# Patient Record
Sex: Male | Born: 1945 | Race: Black or African American | Hispanic: No | Marital: Married | Smoking: Current every day smoker
Health system: Southern US, Community
[De-identification: ages and names within clinical notes are randomized; demographics above are authoritative.]

## PROBLEM LIST (undated history)

## (undated) DIAGNOSIS — C801 Malignant (primary) neoplasm, unspecified: Secondary | ICD-10-CM

## (undated) DIAGNOSIS — I10 Essential (primary) hypertension: Secondary | ICD-10-CM

## (undated) DIAGNOSIS — E119 Type 2 diabetes mellitus without complications: Secondary | ICD-10-CM

## (undated) HISTORY — PX: CHOLECYSTECTOMY: SHX55

## (undated) HISTORY — PX: COLON SURGERY: SHX602

## (undated) HISTORY — PX: TONSILLECTOMY: SUR1361

## (undated) HISTORY — PX: APPENDECTOMY: SHX54

---

## 1998-02-07 ENCOUNTER — Emergency Department (HOSPITAL_COMMUNITY): Admission: EM | Admit: 1998-02-07 | Discharge: 1998-02-07 | Payer: Self-pay | Admitting: Emergency Medicine

## 2002-06-05 ENCOUNTER — Encounter: Admission: RE | Admit: 2002-06-05 | Discharge: 2002-09-03 | Payer: Self-pay | Admitting: Internal Medicine

## 2002-09-19 ENCOUNTER — Encounter: Admission: RE | Admit: 2002-09-19 | Discharge: 2002-12-18 | Payer: Self-pay | Admitting: Internal Medicine

## 2005-06-12 ENCOUNTER — Encounter: Admission: RE | Admit: 2005-06-12 | Discharge: 2005-06-12 | Payer: Self-pay | Admitting: Gastroenterology

## 2005-06-16 ENCOUNTER — Encounter: Admission: RE | Admit: 2005-06-16 | Discharge: 2005-06-16 | Payer: Self-pay | Admitting: Surgery

## 2005-08-05 ENCOUNTER — Inpatient Hospital Stay (HOSPITAL_COMMUNITY): Admission: RE | Admit: 2005-08-05 | Discharge: 2005-08-09 | Payer: Self-pay | Admitting: Surgery

## 2010-09-22 ENCOUNTER — Emergency Department (HOSPITAL_COMMUNITY)
Admission: EM | Admit: 2010-09-22 | Discharge: 2010-09-22 | Disposition: A | Discharge status: Home / Self Care | Payer: Medicare Other | Attending: Emergency Medicine | Admitting: Emergency Medicine

## 2010-09-22 DIAGNOSIS — E119 Type 2 diabetes mellitus without complications: Secondary | ICD-10-CM | POA: Insufficient documentation

## 2010-09-22 DIAGNOSIS — L02818 Cutaneous abscess of other sites: Secondary | ICD-10-CM | POA: Insufficient documentation

## 2010-09-22 DIAGNOSIS — I10 Essential (primary) hypertension: Secondary | ICD-10-CM | POA: Insufficient documentation

## 2010-09-24 ENCOUNTER — Inpatient Hospital Stay (INDEPENDENT_AMBULATORY_CARE_PROVIDER_SITE_OTHER)
Admission: RE | Admit: 2010-09-24 | Discharge: 2010-09-24 | Disposition: A | Discharge status: Home / Self Care | Payer: Medicare Other | Source: Ambulatory Visit | Attending: Family Medicine | Admitting: Family Medicine

## 2010-09-24 DIAGNOSIS — L02818 Cutaneous abscess of other sites: Secondary | ICD-10-CM

## 2010-10-27 ENCOUNTER — Inpatient Hospital Stay (INDEPENDENT_AMBULATORY_CARE_PROVIDER_SITE_OTHER)
Admission: RE | Admit: 2010-10-27 | Discharge: 2010-10-27 | Disposition: A | Discharge status: Home / Self Care | Payer: Medicare Other | Source: Ambulatory Visit | Attending: Family Medicine | Admitting: Family Medicine

## 2010-10-27 DIAGNOSIS — L0201 Cutaneous abscess of face: Secondary | ICD-10-CM

## 2010-10-29 ENCOUNTER — Inpatient Hospital Stay (HOSPITAL_COMMUNITY)
Admission: RE | Admit: 2010-10-29 | Discharge: 2010-10-29 | Disposition: A | Discharge status: Home / Self Care | Payer: Medicare Other | Source: Ambulatory Visit | Attending: Family Medicine | Admitting: Family Medicine

## 2013-08-06 ENCOUNTER — Encounter (HOSPITAL_COMMUNITY): Payer: Self-pay | Admitting: Emergency Medicine

## 2013-08-06 ENCOUNTER — Ambulatory Visit (HOSPITAL_COMMUNITY)
Admission: RE | Admit: 2013-08-06 | Discharge: 2013-08-06 | Disposition: A | Discharge status: Home / Self Care | Payer: Medicare Other | Source: Ambulatory Visit | Attending: Gynecologic Oncology | Admitting: Gynecologic Oncology

## 2013-08-06 ENCOUNTER — Emergency Department (INDEPENDENT_AMBULATORY_CARE_PROVIDER_SITE_OTHER)
Admission: EM | Admit: 2013-08-06 | Discharge: 2013-08-06 | Disposition: A | Discharge status: Home / Self Care | Payer: Medicare Other | Source: Home / Self Care

## 2013-08-06 DIAGNOSIS — M7989 Other specified soft tissue disorders: Secondary | ICD-10-CM

## 2013-08-06 DIAGNOSIS — R609 Edema, unspecified: Secondary | ICD-10-CM | POA: Diagnosis present

## 2013-08-06 DIAGNOSIS — L03119 Cellulitis of unspecified part of limb: Secondary | ICD-10-CM

## 2013-08-06 DIAGNOSIS — L03115 Cellulitis of right lower limb: Secondary | ICD-10-CM

## 2013-08-06 DIAGNOSIS — L02419 Cutaneous abscess of limb, unspecified: Secondary | ICD-10-CM

## 2013-08-06 HISTORY — DX: Essential (primary) hypertension: I10

## 2013-08-06 HISTORY — DX: Type 2 diabetes mellitus without complications: E11.9

## 2013-08-06 HISTORY — DX: Malignant (primary) neoplasm, unspecified: C80.1

## 2013-08-06 LAB — POCT I-STAT, CHEM 8
BUN: 22 mg/dL (ref 6–23)
CHLORIDE: 106 meq/L (ref 96–112)
Calcium, Ion: 1.2 mmol/L (ref 1.13–1.30)
Creatinine, Ser: 1.4 mg/dL — ABNORMAL HIGH (ref 0.50–1.35)
GLUCOSE: 108 mg/dL — AB (ref 70–99)
HEMATOCRIT: 52 % (ref 39.0–52.0)
HEMOGLOBIN: 17.7 g/dL — AB (ref 13.0–17.0)
POTASSIUM: 4.4 meq/L (ref 3.7–5.3)
Sodium: 141 mEq/L (ref 137–147)
TCO2: 24 mmol/L (ref 0–100)

## 2013-08-06 LAB — CBC WITH DIFFERENTIAL/PLATELET
BASOS PCT: 0 % (ref 0–1)
Basophils Absolute: 0 10*3/uL (ref 0.0–0.1)
EOS PCT: 1 % (ref 0–5)
Eosinophils Absolute: 0.1 10*3/uL (ref 0.0–0.7)
HEMATOCRIT: 45.7 % (ref 39.0–52.0)
Hemoglobin: 15.4 g/dL (ref 13.0–17.0)
LYMPHS ABS: 1.9 10*3/uL (ref 0.7–4.0)
LYMPHS PCT: 18 % (ref 12–46)
MCH: 31.3 pg (ref 26.0–34.0)
MCHC: 33.7 g/dL (ref 30.0–36.0)
MCV: 92.9 fL (ref 78.0–100.0)
Monocytes Absolute: 0.6 10*3/uL (ref 0.1–1.0)
Monocytes Relative: 6 % (ref 3–12)
Neutro Abs: 8.2 10*3/uL — ABNORMAL HIGH (ref 1.7–7.7)
Neutrophils Relative %: 75 % (ref 43–77)
Platelets: 309 10*3/uL (ref 150–400)
RBC: 4.92 MIL/uL (ref 4.22–5.81)
RDW: 14 % (ref 11.5–15.5)
WBC: 10.9 10*3/uL — ABNORMAL HIGH (ref 4.0–10.5)

## 2013-08-06 LAB — D-DIMER, QUANTITATIVE (NOT AT ARMC): D DIMER QUANT: 0.44 ug{FEU}/mL (ref 0.00–0.48)

## 2013-08-06 MED ORDER — CEFTRIAXONE SODIUM 1 G IJ SOLR
1.0000 g | Freq: Once | INTRAMUSCULAR | Status: AC
  Administered 2013-08-06: 1 g via INTRAMUSCULAR

## 2013-08-06 MED ORDER — CEPHALEXIN 500 MG PO CAPS: 500.0000 mg | ORAL_CAPSULE | Freq: Four times a day (QID) | ORAL | Status: DC

## 2013-08-06 MED ORDER — ALIGN 4 MG PO CAPS: 1.0000 | ORAL_CAPSULE | Freq: Every day | ORAL | Status: DC

## 2013-08-06 MED ORDER — CEFTRIAXONE SODIUM 1 G IJ SOLR
INTRAMUSCULAR | Status: AC
  Filled 2013-08-06: qty 10

## 2013-08-06 MED ORDER — SULFAMETHOXAZOLE-TMP DS 800-160 MG PO TABS
1.0000 | ORAL_TABLET | Freq: Two times a day (BID) | ORAL | Status: DC
Start: 2013-08-06 — End: 2013-09-15

## 2013-08-06 MED ORDER — LIDOCAINE HCL (PF) 1 % IJ SOLN
INTRAMUSCULAR | Status: AC
  Filled 2013-08-06: qty 5

## 2013-08-06 NOTE — ED Notes (Signed)
Sent to ED to meet doppler tech . Will call us when completed to return to [facility]; labs drawn prior to transport

## 2013-08-06 NOTE — Progress Notes (Signed)
VASCULAR LAB PRELIMINARY  PRELIMINARY  PRELIMINARY  PRELIMINARY  Right lower extremity venous Doppler completed.    Preliminary report:  There is no DVT or SVT noted in the right lower extremity.  There is an enlarged lymph node noted in the right groin.   Kymari Nuon, RVT 08/06/2013, 10:13 AM

## 2013-08-06 NOTE — Discharge Instructions (Signed)
Follow up here in 2 days for recheck.  Return to Emergency Department sooner if any worsening of symptoms such as pain, fever, increased swelling, discharge or numbness and tingling.   Cellulitis Cellulitis is an infection of the skin and the tissue beneath it. The infected area is usually red and tender. Cellulitis occurs most often in the arms and lower legs.  CAUSES  Cellulitis is caused by bacteria that enter the skin through cracks or cuts in the skin. The most common types of bacteria that cause cellulitis are staphylococci and streptococci. SIGNS AND SYMPTOMS   Redness and warmth.  Swelling.  Tenderness or pain.  Fever. DIAGNOSIS  Your health care provider can usually determine what is wrong based on a physical exam. Blood tests may also be done. TREATMENT  Treatment usually involves taking an antibiotic medicine. HOME CARE INSTRUCTIONS   Take your antibiotic medicine as directed by your health care provider. Finish the antibiotic even if you start to feel better.  Keep the infected arm or leg elevated to reduce swelling.  Apply a warm cloth to the affected area up to 4 times per day to relieve pain.  Take medicines only as directed by your health care provider.  Keep all follow-up visits as directed by your health care provider. SEEK MEDICAL CARE IF:   You notice red streaks coming from the infected area.  Your red area gets larger or turns dark in color.  Your bone or joint underneath the infected area becomes painful after the skin has healed.  Your infection returns in the same area or another area.  You notice a swollen bump in the infected area.  You develop new symptoms.  You have a fever. SEEK IMMEDIATE MEDICAL CARE IF:   You feel very sleepy.  You develop vomiting or diarrhea.  You have a general ill feeling (malaise) with muscle aches and pains. MAKE SURE YOU:   Understand these instructions.  Will watch your condition.  Will get help right  away if you are not doing well or get worse. Document Released: 10/08/2004 Document Revised: 05/15/2013 Document Reviewed: 03/16/2011 [facility] Patient Information 2015 [place], [place]. This information is not intended to replace advice given to you by your health care provider. Make sure you discuss any questions you have with your health care provider.

## 2013-08-06 NOTE — ED Notes (Addendum)
C/o right leg swollen from knee down for ~10 days. Started on HCTZ a few days ago from Dr Amadeo Garnet

## 2013-08-06 NOTE — ED Provider Notes (Signed)
CSN: [id number]     Arrival date & time 08/06/13  0901 History   None    Chief Complaint  Patient presents with  . Leg Swelling   (Consider location/radiation/quality/duration/timing/severity/associated sxs/prior Treatment)  HPI  She is a 68 year old male presenting today with complaints of swelling of his right lower extremity which started approximately one week ago. Patient was seen by his primary care provider in the office this past Wednesday and started on hydrochlorothiazide for edema. Patient is type 2 diabetic, controlled with oral medications. Patient denies any pain or discomfort. Denies any numbness or tingling. Patient states that it is "itchy" and states that redness started yesterday after he "went after it" with a rag.  Past Medical History  Diagnosis Date  . Hypertension   . Diabetes mellitus without complication   . Cancer    History reviewed. No pertinent past surgical history. History reviewed. No pertinent family history. History  Substance Use Topics  . Smoking status: Current Every Day Smoker  . Smokeless tobacco: Not on file  . Alcohol Use: No    Review of Systems  Constitutional: Negative.  Negative for fever, chills and fatigue.  HENT: Negative.   Eyes: Negative.   Respiratory: Negative.  Negative for cough, chest tightness, shortness of breath and wheezing.   Cardiovascular: Positive for leg swelling. Negative for chest pain and palpitations.       Patient denies chest pain or pressure. Patient denies shortness of breath, nausea or vomiting.  Patient denies any history of DVT or MI.  Gastrointestinal: Negative.        History of colon cancer and colon resection.  Endocrine: Negative for polydipsia, polyphagia and polyuria.       Patient is a type II diabetic controlled with oral medications.  Genitourinary: Negative.   Musculoskeletal: Negative.   Skin: Positive for color change and wound.       Patient has multiple areas of excoriation and right  lower extremity where he states the leg was "itchy" and he has removed the skin with a rag.  Allergic/Immunologic: Positive for environmental allergies.  Neurological: Negative.  Negative for weakness and headaches.  Hematological: Negative.   Psychiatric/Behavioral: Negative.     Allergies  Review of patient's allergies indicates no known allergies.  Home Medications   Prior to Admission medications   Medication Sig Start Date End Date Taking? Authorizing Provider  glipiZIDE (GLUCOTROL) 10 MG tablet Take 10 mg by mouth daily before breakfast.   Yes Historical Provider, MD  hydrochlorothiazide (HYDRODIURIL) 50 MG tablet Take 50 mg by mouth daily.   Yes Historical Provider, MD  metFORMIN (GLUCOPHAGE) 1000 MG tablet Take 1,000 mg by mouth 2 (two) times daily with a meal.   Yes Historical Provider, MD  cephALEXin (KEFLEX) 500 MG capsule Take 1 capsule (500 mg total) by mouth 4 (four) times daily. 08/07/13   Jacqualyn Posey, NP  Probiotic Product (ALIGN) 4 MG CAPS Take 1 capsule (4 mg total) by mouth daily. 08/06/13   Jacqualyn Posey, NP  sulfamethoxazole-trimethoprim (BACTRIM DS) 800-160 MG per tablet Take 1 tablet by mouth 2 (two) times daily. 08/06/13   Jacqualyn Posey, NP   BP 142/90  Pulse 115  Temp(Src) 98.6 F (37 C) (Oral)  Resp 20  SpO2 94%  Physical Exam  Nursing note and vitals reviewed. Constitutional: He is oriented to person, place, and time. He appears well-developed and well-nourished. No distress.  Cardiovascular: Regular rhythm, normal heart sounds and intact distal pulses.  Exam reveals no  gallop and no friction rub.   No murmur heard. Patient is tachycardic.    Pulmonary/Chest: Effort normal and breath sounds normal. No respiratory distress. He has no wheezes. He has no rales. He exhibits no tenderness.  No adventitious breath sounds noted.  Musculoskeletal:       Legs: Neurological: He is alert and oriented to person, place, and time. He displays normal reflexes.  He exhibits normal muscle tone. Coordination normal.  Cranial nerve II through XII grossly intact.  Skin: Skin is warm and dry. He is not diaphoretic. There is erythema.  See diagram.  Numerous breaks in skin noted to calf and shin of right lower extremity. No evidence of drainage or odor. Warmth and erythema present consistent with a stocking pattern below right knee.  Cap refill to bilateral lower extremities less than 3 seconds. CMS intact to bilateral lower extremities. 2+ pitting edema present on right lower extremity.  Strength 5 over 5 bilateral lower extremities.   ED Course  Procedures (including critical care time) Labs Review Labs Reviewed  CBC WITH DIFFERENTIAL - Abnormal; Notable for the following:    WBC 10.9 (*)    Neutro Abs 8.2 (*)    All other components within normal limits  POCT I-STAT, CHEM 8 - Abnormal; Notable for the following:    Creatinine, Ser 1.40 (*)    Glucose, Bld 108 (*)    Hemoglobin 17.7 (*)    All other components within normal limits  D-DIMER, QUANTITATIVE  I-STAT CHEM 8, ED   Results for orders placed during the hospital encounter of 08/06/13  D-DIMER, QUANTITATIVE      Result Value Ref Range   D-Dimer, Quant 0.44  0.00 - 0.48 ug/mL-FEU  CBC WITH DIFFERENTIAL      Result Value Ref Range   WBC 10.9 (*) 4.0 - 10.5 K/uL   RBC 4.92  4.22 - 5.81 MIL/uL   Hemoglobin 15.4  13.0 - 17.0 g/dL   HCT 45.7  39.0 - 52.0 %   MCV 92.9  78.0 - 100.0 fL   MCH 31.3  26.0 - 34.0 pg   MCHC 33.7  30.0 - 36.0 g/dL   RDW 14.0  11.5 - 15.5 %   Platelets 309  150 - 400 K/uL   Neutrophils Relative % 75  43 - 77 %   Neutro Abs 8.2 (*) 1.7 - 7.7 K/uL   Lymphocytes Relative 18  12 - 46 %   Lymphs Abs 1.9  0.7 - 4.0 K/uL   Monocytes Relative 6  3 - 12 %   Monocytes Absolute 0.6  0.1 - 1.0 K/uL   Eosinophils Relative 1  0 - 5 %   Eosinophils Absolute 0.1  0.0 - 0.7 K/uL   Basophils Relative 0  0 - 1 %   Basophils Absolute 0.0  0.0 - 0.1 K/uL  POCT I-STAT, CHEM 8        Result Value Ref Range   Sodium 141  137 - 147 mEq/L   Potassium 4.4  3.7 - 5.3 mEq/L   Chloride 106  96 - 112 mEq/L   BUN 22  6 - 23 mg/dL   Creatinine, Ser 1.40 (*) 0.50 - 1.35 mg/dL   Glucose, Bld 108 (*) 70 - 99 mg/dL   Calcium, Ion 1.20  1.13 - 1.30 mmol/L   TCO2 24  0 - 100 mmol/L   Hemoglobin 17.7 (*) 13.0 - 17.0 g/dL   HCT 52.0  39.0 - 52.0 %  Imaging Review No results found.  Notification received from vascular lab the patient is negative for DVT of right lower extremity.  MDM   Patient given IM Rocephin today to start Keflex prescription tomorrow.  Patient is also to start Bactrim today. Dr. Georgina Snell consulted on evaluation and plan of care.  1. Cellulitis of right lower extremity    Medications  cefTRIAXone (ROCEPHIN) injection 1 g (1 g Intramuscular Given 08/06/13 1115)   Meds ordered this encounter  Medications  . hydrochlorothiazide (HYDRODIURIL) 50 MG tablet    Sig: Take 50 mg by mouth daily.  Marland Kitchen glipiZIDE (GLUCOTROL) 10 MG tablet    Sig: Take 10 mg by mouth daily before breakfast.  . metFORMIN (GLUCOPHAGE) 1000 MG tablet    Sig: Take 1,000 mg by mouth 2 (two) times daily with a meal.  . cefTRIAXone (ROCEPHIN) injection 1 g    Sig:   . cephALEXin (KEFLEX) 500 MG capsule    Sig: Take 1 capsule (500 mg total) by mouth 4 (four) times daily.    Dispense:  40 capsule    Refill:  0  . Probiotic Product (ALIGN) 4 MG CAPS    Sig: Take 1 capsule (4 mg total) by mouth daily.    Dispense:  30 capsule    Refill:  2  . sulfamethoxazole-trimethoprim (BACTRIM DS) 800-160 MG per tablet    Sig: Take 1 tablet by mouth 2 (two) times daily.    Dispense:  20 tablet    Refill:  0   The patient advised to go to emergency department should any worsening of symptoms occur. In addition, the patient is to return here in 2 days for recheck.  The patient verbalizes understanding and agrees to plan of care.      Jacqualyn Posey, NP 08/06/13 1120

## 2013-08-08 ENCOUNTER — Encounter (HOSPITAL_COMMUNITY): Payer: Self-pay | Admitting: Emergency Medicine

## 2013-08-08 ENCOUNTER — Emergency Department (INDEPENDENT_AMBULATORY_CARE_PROVIDER_SITE_OTHER)
Admission: EM | Admit: 2013-08-08 | Discharge: 2013-08-08 | Disposition: A | Discharge status: Hospice | Payer: Medicare Other | Source: Home / Self Care | Attending: Family Medicine | Admitting: Family Medicine

## 2013-08-08 ENCOUNTER — Emergency Department (HOSPITAL_COMMUNITY)
Admission: EM | Admit: 2013-08-08 | Discharge: 2013-08-08 | Disposition: A | Discharge status: Home / Self Care | Payer: Medicare Other | Attending: Emergency Medicine | Admitting: Emergency Medicine

## 2013-08-08 DIAGNOSIS — Z859 Personal history of malignant neoplasm, unspecified: Secondary | ICD-10-CM | POA: Diagnosis not present

## 2013-08-08 DIAGNOSIS — E119 Type 2 diabetes mellitus without complications: Secondary | ICD-10-CM | POA: Diagnosis not present

## 2013-08-08 DIAGNOSIS — I10 Essential (primary) hypertension: Secondary | ICD-10-CM | POA: Insufficient documentation

## 2013-08-08 DIAGNOSIS — M7989 Other specified soft tissue disorders: Secondary | ICD-10-CM | POA: Insufficient documentation

## 2013-08-08 DIAGNOSIS — L03119 Cellulitis of unspecified part of limb: Secondary | ICD-10-CM

## 2013-08-08 DIAGNOSIS — L02419 Cutaneous abscess of limb, unspecified: Secondary | ICD-10-CM | POA: Diagnosis not present

## 2013-08-08 DIAGNOSIS — Z789 Other specified health status: Secondary | ICD-10-CM

## 2013-08-08 DIAGNOSIS — L03115 Cellulitis of right lower limb: Secondary | ICD-10-CM

## 2013-08-08 LAB — CBG MONITORING, ED: GLUCOSE-CAPILLARY: 81 mg/dL (ref 70–99)

## 2013-08-08 NOTE — ED Notes (Signed)
Sent here from [facility] for followup of cellulitis to right lower leg. Has been on keflex x 2 days.

## 2013-08-08 NOTE — ED Notes (Signed)
Notified RN of CBG 81

## 2013-08-08 NOTE — ED Provider Notes (Signed)
Medical screening examination/treatment/procedure(s) were performed by resident physician or non-physician practitioner and as supervising physician I was immediately available for consultation/collaboration.   Pauline Good MD.   Billy Fischer, MD 08/08/13 [phone]

## 2013-08-08 NOTE — ED Provider Notes (Signed)
CSN: [id number]     Arrival date & time 08/08/13  4097 History   First MD Initiated Contact with Patient 08/08/13 [phone]     Chief Complaint  Patient presents with  . Follow-up   (Consider location/radiation/quality/duration/timing/severity/associated sxs/prior Treatment) HPI Comments: 68 year old male with a history of hypertension and diabetes presents for followup of right lower extremity cellulitis. He has had progressive swelling of the right lower extremity for about 2 weeks. This is associated with pain and itching, and redness. He presented here 2 days ago, a venous duplex was negative for DVT, he was then diagnosed with cellulitis. His labs were normal at that time. He was treated with one gram of ceftriaxone IM and discharged with Keflex 500 mg 4 times a day and Bactrim DS 1 tab twice a day. Today, he states he is not getting any better and he thinks that it is getting slightly worse, he also admits to some nausea but he did not know if that might be caused by the medication. His blood sugar has been well-controlled. He denies any other systemic symptoms.   Past Medical History  Diagnosis Date  . Hypertension   . Diabetes mellitus without complication   . Cancer    History reviewed. No pertinent past surgical history. History reviewed. No pertinent family history. History  Substance Use Topics  . Smoking status: Current Every Day Smoker  . Smokeless tobacco: Not on file  . Alcohol Use: No    Review of Systems  Cardiovascular: Positive for leg swelling.  Skin: Positive for color change.  All other systems reviewed and are negative.   Allergies  Review of patient's allergies indicates no known allergies.  Home Medications   Prior to Admission medications   Medication Sig Start Date End Date Taking? Authorizing Provider  cephALEXin (KEFLEX) 500 MG capsule Take 1 capsule (500 mg total) by mouth 4 (four) times daily. 08/07/13   Jacqualyn Posey, NP  glipiZIDE (GLUCOTROL) 10  MG tablet Take 10 mg by mouth daily before breakfast.    Historical Provider, MD  hydrochlorothiazide (HYDRODIURIL) 50 MG tablet Take 50 mg by mouth daily.    Historical Provider, MD  metFORMIN (GLUCOPHAGE) 1000 MG tablet Take 1,000 mg by mouth 2 (two) times daily with a meal.    Historical Provider, MD  Probiotic Product (ALIGN) 4 MG CAPS Take 1 capsule (4 mg total) by mouth daily. 08/06/13   Jacqualyn Posey, NP  sulfamethoxazole-trimethoprim (BACTRIM DS) 800-160 MG per tablet Take 1 tablet by mouth 2 (two) times daily. 08/06/13   Jacqualyn Posey, NP   BP 129/99  Pulse 97  Temp(Src) 98.4 F (36.9 C) (Oral)  Resp 16 Physical Exam  Nursing note and vitals reviewed. Constitutional: He is oriented to person, place, and time. He appears well-developed and well-nourished. No distress.  HENT:  Head: Normocephalic.  Pulmonary/Chest: Effort normal. No respiratory distress.  Musculoskeletal:       Right ankle: He exhibits swelling. Tenderness.       Right lower leg: He exhibits tenderness and swelling.       Right foot: He exhibits tenderness and swelling.  The right lower extremity from the midcalf down to the knee is swollen, erythematous, tender, edematous. He says tenderness worse than previous exam. On the skin there are numerous small scabbed abrasions. There are no open wounds  Neurological: He is alert and oriented to person, place, and time. Coordination normal.  Skin: Skin is warm and dry. No rash noted. He is not diaphoretic.  Psychiatric: He has a normal mood and affect. Judgment normal.    ED Course  Procedures (including critical care time) Labs Review Labs Reviewed - No data to display  Imaging Review No results found.   MDM   1. Cellulitis of right lower extremity   2. Failure of outpatient treatment    I did not see this patient previously, but based on his symptoms he is failing outpatient treatment. He is being transferred to the emergency department for outpatient  treatment failure of cellulitis in a diabetic patient    Liam Graham, PA-C 08/08/13 0901

## 2013-08-08 NOTE — ED Notes (Signed)
Here for follow up

## 2013-08-08 NOTE — ED Provider Notes (Signed)
CSN: [id number]     Arrival date & time 08/08/13  0915 History   First MD Initiated Contact with Patient 08/08/13 [phone]     Chief Complaint  Patient presents with  . Leg Swelling      HPI Patient states he was placed on Keflex and Bactrim 2 days ago for some right leg swelling and erythema and redness that presented.  He has a history of diabetes.  He underwent venous duplex imaging 2 days ago which demonstrated no DVT in his right lower extremity.  Patient with no history of DVT or PE.  A right groin lymph node was noted at the time.  Denies recent trauma or injury to this foot or ankle.  Patient was seen in urgent care this morning and he was sent to the emergency department for concern of failed outpatient therapy.  Patient is at 48 hours of antibiotics.  Patient reports he has had no worsening of symptoms he just reports that the redness does not seem to be improving as expected.   Past Medical History  Diagnosis Date  . Hypertension   . Diabetes mellitus without complication   . Cancer    History reviewed. No pertinent past surgical history. History reviewed. No pertinent family history. History  Substance Use Topics  . Smoking status: Current Every Day Smoker  . Smokeless tobacco: Not on file  . Alcohol Use: No    Review of Systems  All other systems reviewed and are negative.     Allergies  Review of patient's allergies indicates no known allergies.  Home Medications   Prior to Admission medications   Medication Sig Start Date End Date Taking? Authorizing Provider  cephALEXin (KEFLEX) 500 MG capsule Take 1 capsule (500 mg total) by mouth 4 (four) times daily. 08/07/13   Jacqualyn Posey, NP  glipiZIDE (GLUCOTROL) 10 MG tablet Take 10 mg by mouth daily before breakfast.    Historical Provider, MD  hydrochlorothiazide (HYDRODIURIL) 50 MG tablet Take 50 mg by mouth daily.    Historical Provider, MD  metFORMIN (GLUCOPHAGE) 1000 MG tablet Take 1,000 mg by mouth 2 (two)  times daily with a meal.    Historical Provider, MD  Probiotic Product (ALIGN) 4 MG CAPS Take 1 capsule (4 mg total) by mouth daily. 08/06/13   Jacqualyn Posey, NP  sulfamethoxazole-trimethoprim (BACTRIM DS) 800-160 MG per tablet Take 1 tablet by mouth 2 (two) times daily. 08/06/13   Jacqualyn Posey, NP   BP 138/85  Pulse 94  Temp(Src) 97.3 F (36.3 C) (Oral)  Resp 18  SpO2 96% Physical Exam  Nursing note and vitals reviewed. Constitutional: He is oriented to person, place, and time. He appears well-developed and well-nourished.  Nontoxic.  Overall well-appearing.  Laughing and talking  HENT:  Head: Normocephalic and atraumatic.  Eyes: EOM are normal.  Neck: Normal range of motion.  Cardiovascular: Normal rate, regular rhythm, normal heart sounds and intact distal pulses.   Pulmonary/Chest: Effort normal and breath sounds normal. No respiratory distress.  Abdominal: Soft. He exhibits no distension. There is no tenderness.  Musculoskeletal: Normal range of motion.  Swelling of the right lower extremity as compared to the left.  Normal PT and DP pulses bilaterally.  Issue with mild erythema and tenderness of his right ankle involving the distal one third of his lower leg.  No fluctuance.  No drainage.  Neurological: He is alert and oriented to person, place, and time.  Skin: Skin is warm and dry.  Psychiatric: He has  a normal mood and affect. Judgment normal.    ED Course  Procedures (including critical care time) Labs Review Labs Reviewed  CBG MONITORING, ED    Imaging Review No results found.   EKG Interpretation None      MDM   Final diagnoses:  None    Am not necessarily convinced that he has failed outpatient therapy at this time.  He does not sound like the redness is any worse.  It sounds as though it's not as improving as fast as the patient thought it would.  His blood sugar would be checked here.  I last the patient return to the emergency department in 2 days  for recheck.  I also asked the patient return to ER sooner for any new or worsening symptoms  Blood sugar 81. Cellulitis to be outlined. Recheck in 2 days    Hoy Morn, MD 08/08/13 [phone]

## 2013-08-10 ENCOUNTER — Encounter (HOSPITAL_COMMUNITY): Payer: Self-pay | Admitting: Emergency Medicine

## 2013-08-10 ENCOUNTER — Emergency Department (HOSPITAL_COMMUNITY)
Admission: EM | Admit: 2013-08-10 | Discharge: 2013-08-10 | Disposition: A | Discharge status: Home / Self Care | Payer: Medicare Other | Attending: Emergency Medicine | Admitting: Emergency Medicine

## 2013-08-10 DIAGNOSIS — L03115 Cellulitis of right lower limb: Secondary | ICD-10-CM

## 2013-08-10 DIAGNOSIS — E119 Type 2 diabetes mellitus without complications: Secondary | ICD-10-CM | POA: Diagnosis not present

## 2013-08-10 DIAGNOSIS — M79609 Pain in unspecified limb: Secondary | ICD-10-CM | POA: Diagnosis present

## 2013-08-10 DIAGNOSIS — F17200 Nicotine dependence, unspecified, uncomplicated: Secondary | ICD-10-CM | POA: Diagnosis not present

## 2013-08-10 DIAGNOSIS — Z859 Personal history of malignant neoplasm, unspecified: Secondary | ICD-10-CM | POA: Insufficient documentation

## 2013-08-10 DIAGNOSIS — Z79899 Other long term (current) drug therapy: Secondary | ICD-10-CM | POA: Diagnosis not present

## 2013-08-10 DIAGNOSIS — L02619 Cutaneous abscess of unspecified foot: Secondary | ICD-10-CM | POA: Insufficient documentation

## 2013-08-10 DIAGNOSIS — I10 Essential (primary) hypertension: Secondary | ICD-10-CM | POA: Insufficient documentation

## 2013-08-10 DIAGNOSIS — L03119 Cellulitis of unspecified part of limb: Principal | ICD-10-CM

## 2013-08-10 DIAGNOSIS — Z792 Long term (current) use of antibiotics: Secondary | ICD-10-CM | POA: Diagnosis not present

## 2013-08-10 NOTE — ED Notes (Signed)
Pt reports being seen at [facility] on Sunday for redness and swelling to R foot and thigh. Was sent to ED then to have doppler study for blood clots.  Reports scan was negative. Pt started taking antibiotics and was told to come back to ED for further eval. R lower leg pink and warm to touch.  Edema noted.

## 2013-08-10 NOTE — ED Provider Notes (Signed)
Medical screening examination/treatment/procedure(s) were performed by non-physician practitioner and as supervising physician I was immediately available for consultation/collaboration.   EKG Interpretation None        Merryl Hacker, MD 08/10/13 [phone]

## 2013-08-10 NOTE — ED Provider Notes (Signed)
CSN: [id number]     Arrival date & time 08/10/13  9323 History   First MD Initiated Contact with Patient 08/10/13 [phone]     Chief Complaint  Patient presents with  . Foot Pain     (Consider location/radiation/quality/duration/timing/severity/associated sxs/prior Treatment) HPI  Patient with hx diabetes presents for recheck of cellulitis of right lower leg.  Was seen at Urgent Care 08/06/13 with keflex and bactrim.  Has been seen in follow up in urgent care and in the ED two days ago and was instructed to come back today for a recheck.  Pt notes he has improved swelling, improved pain.  Has intermittent pain in his right foot, but generally has no pain from this. Takes an occasional tylenol, which completely controls the pain.  Denies fevers, chills, myalgias, CP, SOB, N/V, feeling sick in any way.   Reports his blood sugars have been well controlled, 81 in ED two days ago and 120 last night at home.   Past Medical History  Diagnosis Date  . Hypertension   . Diabetes mellitus without complication   . Cancer    Past Surgical History  Procedure Laterality Date  . Colon surgery    . Appendectomy    . Tonsillectomy    . Cholecystectomy     History reviewed. No pertinent family history. History  Substance Use Topics  . Smoking status: Current Every Day Smoker  . Smokeless tobacco: Not on file  . Alcohol Use: No    Review of Systems  Gastrointestinal: Positive for diarrhea.  All other systems reviewed and are negative.     Allergies  Review of patient's allergies indicates no known allergies.  Home Medications   Prior to Admission medications   Medication Sig Start Date End Date Taking? Authorizing Provider  cephALEXin (KEFLEX) 500 MG capsule Take 1 capsule (500 mg total) by mouth 4 (four) times daily. 08/07/13  Yes Jacqualyn Posey, NP  metFORMIN (GLUCOPHAGE) 1000 MG tablet Take 1,000 mg by mouth 2 (two) times daily with a meal.   Yes Historical Provider, MD  Probiotic  Product (ALIGN) 4 MG CAPS Take 1 capsule (4 mg total) by mouth daily. 08/06/13  Yes Jacqualyn Posey, NP  sulfamethoxazole-trimethoprim (BACTRIM DS) 800-160 MG per tablet Take 1 tablet by mouth 2 (two) times daily. 08/06/13  Yes Jacqualyn Posey, NP  ALPRAZolam Duanne Moron) 1 MG tablet Take 1 tablet by mouth 3 (three) times daily as needed for anxiety.  08/04/13   Historical Provider, MD  glipiZIDE (GLUCOTROL) 10 MG tablet Take 10 mg by mouth daily before breakfast.    Historical Provider, MD  lisinopril (PRINIVIL,ZESTRIL) 10 MG tablet Take 10 mg by mouth daily.    Historical Provider, MD  metFORMIN (GLUCOPHAGE) 1000 MG tablet Take 1,000 mg by mouth daily.     Historical Provider, MD   BP 112/68  Pulse 111  Temp(Src) 97.7 F (36.5 C) (Oral)  Resp 16  SpO2 98% Physical Exam  Nursing note and vitals reviewed. Constitutional: He appears well-developed and well-nourished. No distress.  HENT:  Head: Normocephalic and atraumatic.  Neck: Neck supple.  Cardiovascular: Normal rate and regular rhythm.   Pulmonary/Chest: Effort normal and breath sounds normal.  Musculoskeletal:  Right lower leg with pitting edema, very mild erythema.  No significant warmth or tenderness.   Capillary refill < 2 seconds throughout toes of right foot.   Neurological: He is alert.  Skin: He is not diaphoretic.    ED Course  Procedures (including critical care time) Labs  Review Labs Reviewed - No data to display  Imaging Review No results found.   EKG Interpretation None      MDM   Final diagnoses:  Cellulitis of right lower extremity    Afebrile nontoxic diabetic presenting for recheck of his right lower extremity cellulitis.  Per patient, the area is much improved.  He continues to take keflex and bactrim and has 5 days of pills left.  I have asked him to return or be seen by UC or PCP in 4 days (prior to running out of pills) to be rechecked.  We have also discussed return precautions.  Tachycardic on arrival,  improved with rest.  Pt reports blood sugars have been well controlled, 81 in ED two days ago. Discussed findings, treatment, and follow up  with patient.  Pt given return precautions.  Pt verbalizes understanding and agrees with plan.          [place], PA-C 08/10/13 [phone]

## 2013-08-10 NOTE — ED Notes (Signed)
Pt to ED c/o of cellulitis to R foot x 1 week.  Pt was seen at [facility] on Sunday for redness and swelling to R foot; was given antibiotics for cellulitis to foot. Pt reports he wants a follow up to see if he needs IV antibiotics.

## 2013-08-10 NOTE — Discharge Instructions (Signed)
Read the information below.  You may return to the Emergency Department at any time for worsening condition or any new symptoms that concern you.  If you develop increased redness, swelling, pus draining from the wound, uncontrolled pain, or fevers greater than 100.4, return to the ER immediately for a recheck.     Cellulitis Cellulitis is an infection of the skin and the tissue under the skin. The infected area is usually red and tender. This happens most often in the arms and lower legs. HOME CARE   Take your antibiotic medicine as told. Finish the medicine even if you start to feel better.  Keep the infected arm or leg raised (elevated).  Put a warm cloth on the area up to 4 times per day.  Only take medicines as told by your doctor.  Keep all doctor visits as told. GET HELP IF:  You see red streaks on the skin coming from the infected area.  Your red area gets bigger or turns a dark color.  Your bone or joint under the infected area is painful after the skin heals.  Your infection comes back in the same area or different area.  You have a puffy (swollen) bump in the infected area.  You have new symptoms.  You have a fever. GET HELP RIGHT AWAY IF:   You feel very sleepy.  You throw up (vomit) or have watery poop (diarrhea).  You feel sick and have muscle aches and pains. MAKE SURE YOU:   Understand these instructions.  Will watch your condition.  Will get help right away if you are not doing well or get worse. Document Released: 06/17/2007 Document Revised: 05/15/2013 Document Reviewed: 03/16/2011 [facility] Patient Information 2015 [place], [place]. This information is not intended to replace advice given to you by your health care provider. Make sure you discuss any questions you have with your health care provider.

## 2013-08-10 NOTE — ED Provider Notes (Signed)
Medical screening examination/treatment/procedure(s) were performed by a resident physician or non-physician practitioner and as the supervising physician I was immediately available for consultation/collaboration.  Lynne Leader, MD    Gregor Hams, MD 08/10/13 [phone]

## 2013-08-14 ENCOUNTER — Other Ambulatory Visit (HOSPITAL_COMMUNITY): Payer: Self-pay | Admitting: Emergency Medicine

## 2013-08-14 DIAGNOSIS — R609 Edema, unspecified: Secondary | ICD-10-CM

## 2013-08-15 ENCOUNTER — Encounter (HOSPITAL_COMMUNITY): Payer: Self-pay | Admitting: Emergency Medicine

## 2013-08-15 ENCOUNTER — Emergency Department (INDEPENDENT_AMBULATORY_CARE_PROVIDER_SITE_OTHER)
Admission: EM | Admit: 2013-08-15 | Discharge: 2013-08-15 | Disposition: A | Discharge status: Home / Self Care | Payer: Medicare Other | Source: Home / Self Care | Attending: Family Medicine | Admitting: Family Medicine

## 2013-08-15 DIAGNOSIS — L03119 Cellulitis of unspecified part of limb: Secondary | ICD-10-CM

## 2013-08-15 DIAGNOSIS — L02419 Cutaneous abscess of limb, unspecified: Secondary | ICD-10-CM

## 2013-08-15 DIAGNOSIS — L03115 Cellulitis of right lower limb: Secondary | ICD-10-CM

## 2013-08-15 MED ORDER — CEFUROXIME AXETIL 500 MG PO TABS: 500.0000 mg | ORAL_TABLET | Freq: Two times a day (BID) | ORAL | Status: DC

## 2013-08-15 MED ORDER — SULFAMETHOXAZOLE-TMP DS 800-160 MG PO TABS: 1.0000 | ORAL_TABLET | Freq: Two times a day (BID) | ORAL | Status: DC

## 2013-08-15 NOTE — ED Notes (Signed)
Pt here for follow up of swelling of right leg and foot.  States still having mild swelling and tenderness to touch.

## 2013-08-15 NOTE — ED Provider Notes (Signed)
CSN: [id number]     Arrival date & time 08/15/13  0807 History   First MD Initiated Contact with Patient 08/15/13 0820     Chief Complaint  Patient presents with  . Follow-up   (Consider location/radiation/quality/duration/timing/severity/associated sxs/prior Treatment) HPI Comments: 68 year old male presents for followup of right lower extremity cellulitis. he was started on Keflex and Bactrim about 10 days ago. He reports that the cellulitis is improving, but the right lower leg and foot are still slightly swollen and erythematous. He has been elevating it. He had some watery, nonbloody diarrhea but that has resolved with probiotics. The foot is no longer painful or tender.   Past Medical History  Diagnosis Date  . Hypertension   . Diabetes mellitus without complication   . Cancer    Past Surgical History  Procedure Laterality Date  . Colon surgery    . Appendectomy    . Tonsillectomy    . Cholecystectomy     History reviewed. No pertinent family history. History  Substance Use Topics  . Smoking status: Current Every Day Smoker  . Smokeless tobacco: Not on file  . Alcohol Use: No    Review of Systems  Cardiovascular: Positive for leg swelling.  Skin: Positive for color change.  All other systems reviewed and are negative.   Allergies  Review of patient's allergies indicates no known allergies.  Home Medications   Prior to Admission medications   Medication Sig Start Date End Date Taking? Authorizing Provider  ALPRAZolam Duanne Moron) 1 MG tablet Take 1 tablet by mouth 3 (three) times daily as needed for anxiety.  08/04/13  Yes Historical Provider, MD  cephALEXin (KEFLEX) 500 MG capsule Take 1 capsule (500 mg total) by mouth 4 (four) times daily. 08/07/13  Yes Jacqualyn Posey, NP  glipiZIDE (GLUCOTROL) 10 MG tablet Take 10 mg by mouth daily before breakfast.   Yes Historical Provider, MD  lisinopril (PRINIVIL,ZESTRIL) 10 MG tablet Take 10 mg by mouth daily.   Yes Historical  Provider, MD  metFORMIN (GLUCOPHAGE) 1000 MG tablet Take 1,000 mg by mouth daily.    Yes Historical Provider, MD  metFORMIN (GLUCOPHAGE) 1000 MG tablet Take 1,000 mg by mouth 2 (two) times daily with a meal.   Yes Historical Provider, MD  Probiotic Product (ALIGN) 4 MG CAPS Take 1 capsule (4 mg total) by mouth daily. 08/06/13  Yes Jacqualyn Posey, NP  sulfamethoxazole-trimethoprim (BACTRIM DS) 800-160 MG per tablet Take 1 tablet by mouth 2 (two) times daily. 08/06/13  Yes Jacqualyn Posey, NP  cefUROXime (CEFTIN) 500 MG tablet Take 1 tablet (500 mg total) by mouth 2 (two) times daily with a meal. 08/15/13   Liam Graham, PA-C  sulfamethoxazole-trimethoprim (BACTRIM DS) 800-160 MG per tablet Take 1 tablet by mouth 2 (two) times daily. 08/15/13   Freeman Caldron Othel Dicostanzo, PA-C   BP 118/74  Pulse 106  Temp(Src) 98.3 F (36.8 C) (Oral)  Resp 18  Ht 6' (1.829 m)  Wt 210 lb (95.255 kg)  BMI 28.47 kg/m2  SpO2 98% Physical Exam  Nursing note and vitals reviewed. Constitutional: He is oriented to person, place, and time. He appears well-developed and well-nourished. No distress.  HENT:  Head: Normocephalic.  Pulmonary/Chest: Effort normal. No respiratory distress.  Musculoskeletal:  Right lower extremity from the distal third of the calf through the foot is erythematous, edematous, nontender. Range of motion of the ankle and toes are intact.  Neurological: He is alert and oriented to person, place, and time. Coordination normal.  Skin: Skin is warm and dry. No rash noted. He is not diaphoretic.  Psychiatric: He has a normal mood and affect. Judgment normal.    ED Course  Procedures (including critical care time) Labs Review Labs Reviewed - No data to display  Imaging Review No results found.   MDM   1. Cellulitis of right lower extremity    Cellulitis has not completely resolved, will continue antibiotics for one week. Followup in one week for recheck before stopping antibiotics.  Meds ordered  this encounter  Medications  . cefUROXime (CEFTIN) 500 MG tablet    Sig: Take 1 tablet (500 mg total) by mouth 2 (two) times daily with a meal.    Dispense:  14 tablet    Refill:  0    Order Specific Question:  Supervising Provider    Answer:  Lynne Leader, [place]  . sulfamethoxazole-trimethoprim (BACTRIM DS) 800-160 MG per tablet    Sig: Take 1 tablet by mouth 2 (two) times daily.    Dispense:  14 tablet    Refill:  0    Order Specific Question:  Supervising Provider    Answer:  Lynne Leader, [place]       Liam Graham, PA-C 08/15/13 [phone]
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
## 2013-08-15 NOTE — Discharge Instructions (Signed)

## 2013-08-15 NOTE — ED Provider Notes (Signed)
Medical screening examination/treatment/procedure(s) were performed by a resident physician or non-physician practitioner and as the supervising physician I was immediately available for consultation/collaboration.  Lynne Leader, MD   Gregor Hams, MD 08/15/13 [phone]

## 2013-08-22 ENCOUNTER — Emergency Department (INDEPENDENT_AMBULATORY_CARE_PROVIDER_SITE_OTHER)
Admission: EM | Admit: 2013-08-22 | Discharge: 2013-08-22 | Disposition: A | Discharge status: Home / Self Care | Payer: Medicare Other | Source: Home / Self Care | Attending: Family Medicine | Admitting: Family Medicine

## 2013-08-22 ENCOUNTER — Encounter (HOSPITAL_COMMUNITY): Payer: Self-pay | Admitting: Emergency Medicine

## 2013-08-22 DIAGNOSIS — L02419 Cutaneous abscess of limb, unspecified: Secondary | ICD-10-CM

## 2013-08-22 DIAGNOSIS — L03115 Cellulitis of right lower limb: Secondary | ICD-10-CM

## 2013-08-22 DIAGNOSIS — L03119 Cellulitis of unspecified part of limb: Secondary | ICD-10-CM

## 2013-08-22 MED ORDER — HYDROCORTISONE ACE-PRAMOXINE 1-1 % RE CREA: 1.0000 "application " | TOPICAL_CREAM | Freq: Two times a day (BID) | RECTAL | Status: DC

## 2013-08-22 MED ORDER — CEFUROXIME AXETIL 500 MG PO TABS: 500.0000 mg | ORAL_TABLET | Freq: Two times a day (BID) | ORAL | Status: DC

## 2013-08-22 MED ORDER — SULFAMETHOXAZOLE-TMP DS 800-160 MG PO TABS: 1.0000 | ORAL_TABLET | Freq: Two times a day (BID) | ORAL | Status: DC

## 2013-08-22 NOTE — ED Notes (Signed)
Here to follow up on right foot infection States he feels as if the foot is getting better States he is almost finish with antibiotics Has been wearing compression stockings

## 2013-08-22 NOTE — Discharge Instructions (Signed)

## 2013-08-22 NOTE — ED Provider Notes (Signed)
CSN: [id number]     Arrival date & time 08/22/13  0806 History   None    Chief Complaint  Patient presents with  . Follow-up   (Consider location/radiation/quality/duration/timing/severity/associated sxs/prior Treatment) HPI Comments: Pt presents for f/u RLE cellulitis.  Initially presented 16 days ago, started on keflex, bactrim subsequently added.  Switched to ceftin and bactrim for 1 week continued treatment a week ago bc not fully resolved, but was improving.  Today he reports decreasing pain but continued right lower extremity swelling. The swelling has gotten better but is still present. No systemic symptoms. He has taken the antibiotics as prescribed.  Also complains of inflamed hemorrhoid, c/o chronic occasional hemorrhoid problems.  Using neosporin without relief.  Would like something better.  Denies diarrhea or constipation.     Past Medical History  Diagnosis Date  . Hypertension   . Diabetes mellitus without complication   . Cancer    Past Surgical History  Procedure Laterality Date  . Colon surgery    . Appendectomy    . Tonsillectomy    . Cholecystectomy     History reviewed. No pertinent family history. History  Substance Use Topics  . Smoking status: Current Every Day Smoker  . Smokeless tobacco: Not on file  . Alcohol Use: No    Review of Systems  Cardiovascular: Positive for leg swelling.  All other systems reviewed and are negative.   Allergies  Review of patient's allergies indicates no known allergies.  Home Medications   Prior to Admission medications   Medication Sig Start Date End Date Taking? Authorizing Provider  ALPRAZolam Duanne Moron) 1 MG tablet Take 1 tablet by mouth 3 (three) times daily as needed for anxiety.  08/04/13   Historical Provider, MD  cefUROXime (CEFTIN) 500 MG tablet Take 1 tablet (500 mg total) by mouth 2 (two) times daily with a meal. 08/22/13   Liam Graham, PA-C  cephALEXin (KEFLEX) 500 MG capsule Take 1 capsule (500 mg total)  by mouth 4 (four) times daily. 08/07/13   Nehemiah Settle, NP  glipiZIDE (GLUCOTROL) 10 MG tablet Take 10 mg by mouth daily before breakfast.    Historical Provider, MD  lisinopril (PRINIVIL,ZESTRIL) 10 MG tablet Take 10 mg by mouth daily.    Historical Provider, MD  metFORMIN (GLUCOPHAGE) 1000 MG tablet Take 1,000 mg by mouth daily.     Historical Provider, MD  metFORMIN (GLUCOPHAGE) 1000 MG tablet Take 1,000 mg by mouth 2 (two) times daily with a meal.    Historical Provider, MD  pramoxine-hydrocortisone [facility]) 1-1 % rectal cream Place 1 application rectally 2 (two) times daily. 08/22/13   Liam Graham, PA-C  Probiotic Product (ALIGN) 4 MG CAPS Take 1 capsule (4 mg total) by mouth daily. 08/06/13   Nehemiah Settle, NP  sulfamethoxazole-trimethoprim (BACTRIM DS) 800-160 MG per tablet Take 1 tablet by mouth 2 (two) times daily. 08/06/13   Nehemiah Settle, NP  sulfamethoxazole-trimethoprim (BACTRIM DS) 800-160 MG per tablet Take 1 tablet by mouth 2 (two) times daily. 08/22/13   Liam Graham, PA-C   There were no vitals taken for this visit. Physical Exam  Nursing note and vitals reviewed. Constitutional: He is oriented to person, place, and time. He appears well-developed and well-nourished. No distress.  HENT:  Head: Normocephalic.  Pulmonary/Chest: Effort normal. No respiratory distress.  Musculoskeletal:  The right lower extremity from the distal third of the calf through the foot is edematous with very mild erythema, not warm or tender  Neurological: He is alert and oriented to person, place, and time. Coordination normal.  Skin: Skin is warm and dry. No rash noted. He is not diaphoretic.  Psychiatric: He has a normal mood and affect. Judgment normal.    ED Course  Procedures (including critical care time) Labs Review Labs Reviewed - No data to display  Imaging Review No results found.   MDM   1. Cellulitis of right lower extremity    Cellulitis not completely  resolved. We'll continue antibiotics and have him followup in 10 days, if he is still not resolved will discuss with his primary care physician to arrange for MRI of right lower extremity, rule out osteomyelitis  analpram hc for hemorrhoids, will examine at follow up if not improving    Meds ordered this encounter  Medications  . cefUROXime (CEFTIN) 500 MG tablet    Sig: Take 1 tablet (500 mg total) by mouth 2 (two) times daily with a meal.    Dispense:  20 tablet    Refill:  0    Order Specific Question:  Supervising Provider    Answer:  Lynne Leader, [place]  . sulfamethoxazole-trimethoprim (BACTRIM DS) 800-160 MG per tablet    Sig: Take 1 tablet by mouth 2 (two) times daily.    Dispense:  20 tablet    Refill:  0    Order Specific Question:  Supervising Provider    Answer:  Lynne Leader, [place]  . pramoxine-hydrocortisone (ANALPRAM-HC) 1-1 % rectal cream    Sig: Place 1 application rectally 2 (two) times daily.    Dispense:  30 g    Refill:  0    Order Specific Question:  Supervising Provider    Answer:  Lynne Leader, [place]       Liam Graham, PA-C 08/22/13 [place] Adda Stokes, PA-C 08/22/13 [phone]

## 2013-08-28 NOTE — ED Provider Notes (Signed)
Medical screening examination/treatment/procedure(s) were performed by a resident physician or non-physician practitioner and as the supervising physician I was immediately available for consultation/collaboration.  Linna Darner, MD Family Medicine    Waldemar Dickens, MD 08/28/13 2111

## 2013-08-31 ENCOUNTER — Encounter (HOSPITAL_COMMUNITY): Payer: Self-pay | Admitting: Emergency Medicine

## 2013-08-31 ENCOUNTER — Emergency Department (INDEPENDENT_AMBULATORY_CARE_PROVIDER_SITE_OTHER)
Admission: EM | Admit: 2013-08-31 | Discharge: 2013-08-31 | Disposition: A | Discharge status: Home / Self Care | Payer: Medicare Other | Source: Home / Self Care | Attending: Emergency Medicine | Admitting: Emergency Medicine

## 2013-08-31 DIAGNOSIS — L03119 Cellulitis of unspecified part of limb: Secondary | ICD-10-CM

## 2013-08-31 DIAGNOSIS — L03115 Cellulitis of right lower limb: Secondary | ICD-10-CM

## 2013-08-31 DIAGNOSIS — L02419 Cutaneous abscess of limb, unspecified: Secondary | ICD-10-CM

## 2013-08-31 MED ORDER — SULFAMETHOXAZOLE-TMP DS 800-160 MG PO TABS: 1.0000 | ORAL_TABLET | Freq: Two times a day (BID) | ORAL | Status: DC

## 2013-08-31 MED ORDER — CEFUROXIME AXETIL 500 MG PO TABS: 500.0000 mg | ORAL_TABLET | Freq: Two times a day (BID) | ORAL | Status: DC

## 2013-08-31 NOTE — Discharge Instructions (Signed)

## 2013-08-31 NOTE — ED Provider Notes (Signed)
CSN: [id number]     Arrival date & time 08/31/13  5053 History   First MD Initiated Contact with Patient 08/31/13 0915     Chief Complaint  Patient presents with  . Follow-up   (Consider location/radiation/quality/duration/timing/severity/associated sxs/prior Treatment) HPI Comments: 77m presents for f/u of RLE cellulitis.  He has been treated for approximately 1 month.  He reports he is getting better but still having mild swelling of the foot.  He reports it is much better today than previous visit.  No systemic sxs.  No abd Px or diarrhea   The history is provided by the patient.    Past Medical History  Diagnosis Date  . Hypertension   . Diabetes mellitus without complication   . Cancer    Past Surgical History  Procedure Laterality Date  . Colon surgery    . Appendectomy    . Tonsillectomy    . Cholecystectomy     History reviewed. No pertinent family history. History  Substance Use Topics  . Smoking status: Current Every Day Smoker  . Smokeless tobacco: Not on file  . Alcohol Use: No    Review of Systems  Cardiovascular: Positive for leg swelling.  All other systems reviewed and are negative.   Allergies  Review of patient's allergies indicates no known allergies.  Home Medications   Prior to Admission medications   Medication Sig Start Date End Date Taking? Authorizing Provider  ALPRAZolam Duanne Moron) 1 MG tablet Take 1 tablet by mouth 3 (three) times daily as needed for anxiety.  08/04/13   Historical Provider, MD  cefUROXime (CEFTIN) 500 MG tablet Take 1 tablet (500 mg total) by mouth 2 (two) times daily with a meal. 08/31/13   Liam Graham, PA-C  cephALEXin (KEFLEX) 500 MG capsule Take 1 capsule (500 mg total) by mouth 4 (four) times daily. 08/07/13   Nehemiah Settle, NP  glipiZIDE (GLUCOTROL) 10 MG tablet Take 10 mg by mouth daily before breakfast.    Historical Provider, MD  lisinopril (PRINIVIL,ZESTRIL) 10 MG tablet Take 10 mg by mouth daily.    Historical  Provider, MD  metFORMIN (GLUCOPHAGE) 1000 MG tablet Take 1,000 mg by mouth daily.     Historical Provider, MD  metFORMIN (GLUCOPHAGE) 1000 MG tablet Take 1,000 mg by mouth 2 (two) times daily with a meal.    Historical Provider, MD  pramoxine-hydrocortisone [facility]) 1-1 % rectal cream Place 1 application rectally 2 (two) times daily. 08/22/13   Liam Graham, PA-C  Probiotic Product (ALIGN) 4 MG CAPS Take 1 capsule (4 mg total) by mouth daily. 08/06/13   Nehemiah Settle, NP  sulfamethoxazole-trimethoprim (BACTRIM DS) 800-160 MG per tablet Take 1 tablet by mouth 2 (two) times daily. 08/06/13   Nehemiah Settle, NP  sulfamethoxazole-trimethoprim (BACTRIM DS) 800-160 MG per tablet Take 1 tablet by mouth 2 (two) times daily. 08/31/13   Freeman Caldron Sparsh Callens, PA-C   BP 118/78  Pulse 96  Temp(Src) 99 F (37.2 C) (Oral)  Resp 18  SpO2 98% Physical Exam  Nursing note and vitals reviewed. Constitutional: He is oriented to person, place, and time. He appears well-developed and well-nourished. No distress.  HENT:  Head: Normocephalic.  Pulmonary/Chest: Effort normal. No respiratory distress.  Musculoskeletal:  Minimal RLE swelling/edema, now 2+ over the foot only, calf swelling has resolved   Neurological: He is alert and oriented to person, place, and time. Coordination normal.  Skin: Skin is warm and dry. No rash noted. He is not diaphoretic.  Psychiatric: He has a normal mood and affect. Judgment normal.    ED Course  Procedures (including critical care time) Labs Review Labs Reviewed - No data to display  Imaging Review No results found.   MDM   1. Cellulitis of right lower extremity     Not completely resolved.  Continue ABx for 2 weeks.  F/u in 2 weeks    Meds ordered this encounter  Medications  . sulfamethoxazole-trimethoprim (BACTRIM DS) 800-160 MG per tablet    Sig: Take 1 tablet by mouth 2 (two) times daily.    Dispense:  28 tablet    Refill:  0    Order Specific  Question:  Supervising Provider    Answer:  Jake Michaelis, DAVID C [id number]  . cefUROXime (CEFTIN) 500 MG tablet    Sig: Take 1 tablet (500 mg total) by mouth 2 (two) times daily with a meal.    Dispense:  28 tablet    Refill:  0    Order Specific Question:  Supervising Provider    Answer:  Jake Michaelis, DAVID C [[id number]]     Liam Graham, PA-C 08/31/13 [place] Quentina Fronek, PA-C 08/31/13 [phone]

## 2013-08-31 NOTE — ED Provider Notes (Signed)
Medical screening examination/treatment/procedure(s) were performed by non-physician practitioner and as supervising physician I was immediately available for consultation/collaboration.  Philipp Deputy, M.D.  Harden Mo, MD 08/31/13 [phone]

## 2013-08-31 NOTE — ED Notes (Signed)
Seen  Recently  For  Leg  Infection   Pt is  Here  Today  For a  followup  Of  His  r leg     He  Is  Taking  Anti biotics      He  States  The  Swelling  Is  Better

## 2013-09-15 ENCOUNTER — Emergency Department (HOSPITAL_COMMUNITY): Payer: Medicare Other

## 2013-09-15 ENCOUNTER — Inpatient Hospital Stay (HOSPITAL_COMMUNITY): Payer: Medicare Other

## 2013-09-15 ENCOUNTER — Inpatient Hospital Stay (HOSPITAL_COMMUNITY)
Admission: EM | Admit: 2013-09-15 | Discharge: 2013-09-28 | DRG: 871 | Disposition: A | Discharge status: Skilled Nursing Facility | Payer: Medicare Other | Attending: Internal Medicine | Admitting: Internal Medicine

## 2013-09-15 ENCOUNTER — Encounter (HOSPITAL_COMMUNITY): Payer: Self-pay | Admitting: Emergency Medicine

## 2013-09-15 DIAGNOSIS — N179 Acute kidney failure, unspecified: Secondary | ICD-10-CM

## 2013-09-15 DIAGNOSIS — K7200 Acute and subacute hepatic failure without coma: Secondary | ICD-10-CM | POA: Diagnosis present

## 2013-09-15 DIAGNOSIS — R6520 Severe sepsis without septic shock: Secondary | ICD-10-CM

## 2013-09-15 DIAGNOSIS — D61818 Other pancytopenia: Secondary | ICD-10-CM | POA: Diagnosis present

## 2013-09-15 DIAGNOSIS — R571 Hypovolemic shock: Secondary | ICD-10-CM | POA: Diagnosis present

## 2013-09-15 DIAGNOSIS — E875 Hyperkalemia: Secondary | ICD-10-CM | POA: Diagnosis present

## 2013-09-15 DIAGNOSIS — E1169 Type 2 diabetes mellitus with other specified complication: Secondary | ICD-10-CM | POA: Diagnosis present

## 2013-09-15 DIAGNOSIS — R278 Other lack of coordination: Secondary | ICD-10-CM

## 2013-09-15 DIAGNOSIS — N183 Chronic kidney disease, stage 3 unspecified: Secondary | ICD-10-CM | POA: Diagnosis present

## 2013-09-15 DIAGNOSIS — F411 Generalized anxiety disorder: Secondary | ICD-10-CM | POA: Diagnosis present

## 2013-09-15 DIAGNOSIS — G92 Toxic encephalopathy: Secondary | ICD-10-CM | POA: Diagnosis present

## 2013-09-15 DIAGNOSIS — E872 Acidosis, unspecified: Secondary | ICD-10-CM

## 2013-09-15 DIAGNOSIS — R634 Abnormal weight loss: Secondary | ICD-10-CM | POA: Diagnosis present

## 2013-09-15 DIAGNOSIS — F17200 Nicotine dependence, unspecified, uncomplicated: Secondary | ICD-10-CM | POA: Diagnosis present

## 2013-09-15 DIAGNOSIS — Z23 Encounter for immunization: Secondary | ICD-10-CM | POA: Diagnosis not present

## 2013-09-15 DIAGNOSIS — R1310 Dysphagia, unspecified: Secondary | ICD-10-CM | POA: Diagnosis present

## 2013-09-15 DIAGNOSIS — R578 Other shock: Secondary | ICD-10-CM | POA: Diagnosis present

## 2013-09-15 DIAGNOSIS — L03119 Cellulitis of unspecified part of limb: Secondary | ICD-10-CM | POA: Diagnosis present

## 2013-09-15 DIAGNOSIS — R748 Abnormal levels of other serum enzymes: Secondary | ICD-10-CM | POA: Diagnosis present

## 2013-09-15 DIAGNOSIS — I82409 Acute embolism and thrombosis of unspecified deep veins of unspecified lower extremity: Secondary | ICD-10-CM | POA: Diagnosis not present

## 2013-09-15 DIAGNOSIS — G9340 Encephalopathy, unspecified: Secondary | ICD-10-CM

## 2013-09-15 DIAGNOSIS — R570 Cardiogenic shock: Secondary | ICD-10-CM | POA: Diagnosis present

## 2013-09-15 DIAGNOSIS — R197 Diarrhea, unspecified: Secondary | ICD-10-CM | POA: Diagnosis present

## 2013-09-15 DIAGNOSIS — G929 Unspecified toxic encephalopathy: Secondary | ICD-10-CM | POA: Diagnosis present

## 2013-09-15 DIAGNOSIS — E1129 Type 2 diabetes mellitus with other diabetic kidney complication: Secondary | ICD-10-CM | POA: Diagnosis present

## 2013-09-15 DIAGNOSIS — R4182 Altered mental status, unspecified: Secondary | ICD-10-CM | POA: Diagnosis present

## 2013-09-15 DIAGNOSIS — E162 Hypoglycemia, unspecified: Secondary | ICD-10-CM | POA: Diagnosis present

## 2013-09-15 DIAGNOSIS — I129 Hypertensive chronic kidney disease with stage 1 through stage 4 chronic kidney disease, or unspecified chronic kidney disease: Secondary | ICD-10-CM | POA: Diagnosis present

## 2013-09-15 DIAGNOSIS — A419 Sepsis, unspecified organism: Principal | ICD-10-CM | POA: Diagnosis present

## 2013-09-15 DIAGNOSIS — E139 Other specified diabetes mellitus without complications: Secondary | ICD-10-CM | POA: Diagnosis present

## 2013-09-15 DIAGNOSIS — L02619 Cutaneous abscess of unspecified foot: Secondary | ICD-10-CM | POA: Diagnosis present

## 2013-09-15 DIAGNOSIS — I82402 Acute embolism and thrombosis of unspecified deep veins of left lower extremity: Secondary | ICD-10-CM

## 2013-09-15 DIAGNOSIS — I498 Other specified cardiac arrhythmias: Secondary | ICD-10-CM | POA: Diagnosis present

## 2013-09-15 DIAGNOSIS — I959 Hypotension, unspecified: Secondary | ICD-10-CM | POA: Diagnosis present

## 2013-09-15 DIAGNOSIS — D696 Thrombocytopenia, unspecified: Secondary | ICD-10-CM

## 2013-09-15 DIAGNOSIS — L03115 Cellulitis of right lower limb: Secondary | ICD-10-CM | POA: Diagnosis present

## 2013-09-15 DIAGNOSIS — E870 Hyperosmolality and hypernatremia: Secondary | ICD-10-CM | POA: Diagnosis present

## 2013-09-15 DIAGNOSIS — R509 Fever, unspecified: Secondary | ICD-10-CM

## 2013-09-15 DIAGNOSIS — E8729 Other acidosis: Secondary | ICD-10-CM | POA: Diagnosis present

## 2013-09-15 LAB — BASIC METABOLIC PANEL
ANION GAP: 22 — AB (ref 5–15)
BUN: 136 mg/dL — ABNORMAL HIGH (ref 6–23)
BUN: 130 mg/dL — ABNORMAL HIGH (ref 6–23)
CALCIUM: 6.2 mg/dL — AB (ref 8.4–10.5)
CALCIUM: 6.7 mg/dL — AB (ref 8.4–10.5)
CO2: <7 mEq/L — CL (ref 19–32)
CO2: 7 mEq/L — CL (ref 19–32)
Chloride: 109 mEq/L (ref 96–112)
Chloride: 113 mEq/L — ABNORMAL HIGH (ref 96–112)
Creatinine, Ser: 9.78 mg/dL — ABNORMAL HIGH (ref 0.50–1.35)
Creatinine, Ser: 9 mg/dL — ABNORMAL HIGH (ref 0.50–1.35)
GFR calc Af Amer: 6 mL/min — ABNORMAL LOW (ref >90)
GFR calc non Af Amer: 5 mL/min — ABNORMAL LOW (ref >90)
GFR, EST AFRICAN AMERICAN: 6 mL/min — AB (ref >90)
GFR, EST NON AFRICAN AMERICAN: 5 mL/min — AB (ref >90)
Glucose, Bld: 157 mg/dL — ABNORMAL HIGH (ref 70–99)
Glucose, Bld: 159 mg/dL — ABNORMAL HIGH (ref 70–99)
POTASSIUM: 6.5 meq/L — AB (ref 3.7–5.3)
Potassium: 5.9 mEq/L — ABNORMAL HIGH (ref 3.7–5.3)
Sodium: 137 mEq/L (ref 137–147)
Sodium: 142 mEq/L (ref 137–147)

## 2013-09-15 LAB — CBC WITH DIFFERENTIAL/PLATELET
BASOS ABS: 0.1 10*3/uL (ref 0.0–0.1)
Basophils Relative: 1 % (ref 0–1)
Eosinophils Absolute: 3.5 10*3/uL — ABNORMAL HIGH (ref 0.0–0.7)
Eosinophils Relative: 24 % — ABNORMAL HIGH (ref 0–5)
HCT: 35.5 % — ABNORMAL LOW (ref 39.0–52.0)
HEMOGLOBIN: 11.9 g/dL — AB (ref 13.0–17.0)
LYMPHS PCT: 24 % (ref 12–46)
Lymphs Abs: 3.5 10*3/uL (ref 0.7–4.0)
MCH: 30.2 pg (ref 26.0–34.0)
MCHC: 33.5 g/dL (ref 30.0–36.0)
MCV: 90.1 fL (ref 78.0–100.0)
MONO ABS: 0.9 10*3/uL (ref 0.1–1.0)
Monocytes Relative: 6 % (ref 3–12)
Neutro Abs: 6.7 10*3/uL (ref 1.7–7.7)
Neutrophils Relative %: 45 % (ref 43–77)
PLATELETS: 242 10*3/uL (ref 150–400)
RBC: 3.94 MIL/uL — ABNORMAL LOW (ref 4.22–5.81)
RDW: 15.6 % — AB (ref 11.5–15.5)
WBC: 14.7 10*3/uL — ABNORMAL HIGH (ref 4.0–10.5)

## 2013-09-15 LAB — URINALYSIS, ROUTINE W REFLEX MICROSCOPIC
Glucose, UA: NEGATIVE mg/dL
Glucose, UA: NEGATIVE mg/dL
Ketones, ur: 15 mg/dL — AB
Ketones, ur: NEGATIVE mg/dL
Nitrite: NEGATIVE
Nitrite: NEGATIVE
PROTEIN: 100 mg/dL — AB
Protein, ur: 100 mg/dL — AB
SPECIFIC GRAVITY, URINE: 1.024 (ref 1.005–1.030)
Specific Gravity, Urine: 1.017 (ref 1.005–1.030)
Urobilinogen, UA: 0.2 mg/dL (ref 0.0–1.0)
Urobilinogen, UA: 0.2 mg/dL (ref 0.0–1.0)
pH: 5 (ref 5.0–8.0)
pH: 5.5 (ref 5.0–8.0)

## 2013-09-15 LAB — URINE MICROSCOPIC-ADD ON

## 2013-09-15 LAB — I-STAT ARTERIAL BLOOD GAS, ED
Acid-base deficit: 21 mmol/L — ABNORMAL HIGH (ref 0.0–2.0)
Bicarbonate: 6.6 mEq/L — ABNORMAL LOW (ref 20.0–24.0)
O2 Saturation: 94 %
PCO2 ART: 18.7 mmHg — AB (ref 35.0–45.0)
PH ART: 7.145 — AB (ref 7.350–7.450)
PO2 ART: 83 mmHg (ref 80.0–100.0)
Patient temperature: 95.6
TCO2: 7 mmol/L (ref 0–100)

## 2013-09-15 LAB — PHOSPHORUS: Phosphorus: 5.8 mg/dL — ABNORMAL HIGH (ref 2.3–4.6)

## 2013-09-15 LAB — TROPONIN I

## 2013-09-15 LAB — MAGNESIUM: MAGNESIUM: 2.5 mg/dL (ref 1.5–2.5)

## 2013-09-15 LAB — BASIC METABOLIC PANEL WITH GFR
Anion gap: 21 — ABNORMAL HIGH (ref 5–15)
BUN: 126 mg/dL — ABNORMAL HIGH (ref 6–23)
CO2: 7 meq/L — CL (ref 19–32)
Calcium: 6.8 mg/dL — ABNORMAL LOW (ref 8.4–10.5)
Chloride: 113 meq/L — ABNORMAL HIGH (ref 96–112)
Creatinine, Ser: 9.2 mg/dL — ABNORMAL HIGH (ref 0.50–1.35)
GFR calc Af Amer: 6 mL/min — ABNORMAL LOW
GFR calc non Af Amer: 5 mL/min — ABNORMAL LOW
Glucose, Bld: 141 mg/dL — ABNORMAL HIGH (ref 70–99)
Potassium: 5.4 meq/L — ABNORMAL HIGH (ref 3.7–5.3)
Sodium: 141 meq/L (ref 137–147)

## 2013-09-15 LAB — CK: Total CK: 94 U/L (ref 7–232)

## 2013-09-15 LAB — RAPID URINE DRUG SCREEN, HOSP PERFORMED
AMPHETAMINES: NOT DETECTED
Barbiturates: NOT DETECTED
Benzodiazepines: POSITIVE — AB
COCAINE: NOT DETECTED
OPIATES: NOT DETECTED
TETRAHYDROCANNABINOL: NOT DETECTED

## 2013-09-15 LAB — COMPREHENSIVE METABOLIC PANEL
ALBUMIN: 2.2 g/dL — AB (ref 3.5–5.2)
ALT: 534 U/L — AB (ref 0–53)
AST: 402 U/L — AB (ref 0–37)
Alkaline Phosphatase: 328 U/L — ABNORMAL HIGH (ref 39–117)
BUN: 141 mg/dL — ABNORMAL HIGH (ref 6–23)
CREATININE: 10.72 mg/dL — AB (ref 0.50–1.35)
Calcium: 6.9 mg/dL — ABNORMAL LOW (ref 8.4–10.5)
Chloride: 107 mEq/L (ref 96–112)
GFR calc Af Amer: 5 mL/min — ABNORMAL LOW (ref >90)
GFR, EST NON AFRICAN AMERICAN: 4 mL/min — AB (ref >90)
Glucose, Bld: 178 mg/dL — ABNORMAL HIGH (ref 70–99)
Potassium: 6.3 mEq/L — ABNORMAL HIGH (ref 3.7–5.3)
SODIUM: 137 meq/L (ref 137–147)
TOTAL PROTEIN: 4.5 g/dL — AB (ref 6.0–8.3)
Total Bilirubin: 0.6 mg/dL (ref 0.3–1.2)

## 2013-09-15 LAB — GLUCOSE, CAPILLARY
GLUCOSE-CAPILLARY: 179 mg/dL — AB (ref 70–99)
Glucose-Capillary: 125 mg/dL — ABNORMAL HIGH (ref 70–99)

## 2013-09-15 LAB — I-STAT CG4 LACTIC ACID, ED: Lactic Acid, Venous: 2.67 mmol/L — ABNORMAL HIGH (ref 0.5–2.2)

## 2013-09-15 LAB — OSMOLALITY: OSMOLALITY: 343 mosm/kg — AB (ref 275–300)

## 2013-09-15 LAB — I-STAT VENOUS BLOOD GAS, ED
Acid-base deficit: 21 mmol/L — ABNORMAL HIGH (ref 0.0–2.0)
BICARBONATE: 6.5 meq/L — AB (ref 20.0–24.0)
O2 Saturation: 74 %
TCO2: 7 mmol/L (ref 0–100)
pCO2, Ven: 20.3 mmHg — ABNORMAL LOW (ref 45.0–50.0)
pH, Ven: 7.109 — CL (ref 7.250–7.300)
pO2, Ven: 49 mmHg — ABNORMAL HIGH (ref 30.0–45.0)

## 2013-09-15 LAB — PROTIME-INR
INR: 1.39 (ref 0.00–1.49)
Prothrombin Time: 17.1 seconds — ABNORMAL HIGH (ref 11.6–15.2)

## 2013-09-15 LAB — CBG MONITORING, ED
GLUCOSE-CAPILLARY: 50 mg/dL — AB (ref 70–99)
Glucose-Capillary: 121 mg/dL — ABNORMAL HIGH (ref 70–99)

## 2013-09-15 LAB — MRSA PCR SCREENING: MRSA by PCR: NEGATIVE

## 2013-09-15 LAB — ETHANOL

## 2013-09-15 LAB — SALICYLATE LEVEL: Salicylate Lvl: <2 mg/dL — ABNORMAL LOW (ref 2.8–20.0)

## 2013-09-15 LAB — SODIUM, URINE, RANDOM: SODIUM UR: 33 meq/L

## 2013-09-15 LAB — AMMONIA: Ammonia: 69 umol/L — ABNORMAL HIGH (ref 11–60)

## 2013-09-15 LAB — CREATININE, URINE, RANDOM: Creatinine, Urine: 254.38 mg/dL

## 2013-09-15 MED ORDER — SODIUM POLYSTYRENE SULFONATE 15 GM/60ML PO SUSP
15.0000 g | Freq: Once | ORAL | Status: AC
  Administered 2013-09-15: 15 g via ORAL
  Filled 2013-09-15: qty 60

## 2013-09-15 MED ORDER — SODIUM CHLORIDE 0.9 % IV BOLUS (SEPSIS)
1000.0000 mL | Freq: Once | INTRAVENOUS | Status: AC
  Administered 2013-09-15: 1000 mL via INTRAVENOUS

## 2013-09-15 MED ORDER — PNEUMOCOCCAL VAC POLYVALENT 25 MCG/0.5ML IJ INJ
0.5000 mL | INJECTION | INTRAMUSCULAR | Status: AC
  Administered 2013-09-16: 0.5 mL via INTRAMUSCULAR
  Filled 2013-09-15: qty 0.5

## 2013-09-15 MED ORDER — DEXTROSE 50 % IV SOLN
1.0000 | Freq: Once | INTRAVENOUS | Status: AC
  Administered 2013-09-15: 50 mL via INTRAVENOUS

## 2013-09-15 MED ORDER — SODIUM CHLORIDE 0.9 % IV BOLUS (SEPSIS)
500.0000 mL | Freq: Once | INTRAVENOUS | Status: AC
  Administered 2013-09-15: 500 mL via INTRAVENOUS

## 2013-09-15 MED ORDER — SODIUM BICARBONATE 8.4 % IV SOLN
INTRAVENOUS | Status: DC
  Filled 2013-09-15: qty 100

## 2013-09-15 MED ORDER — PIPERACILLIN-TAZOBACTAM 3.375 G IVPB 30 MIN
3.3750 g | Freq: Once | INTRAVENOUS | Status: AC
  Administered 2013-09-15: 3.375 g via INTRAVENOUS
  Filled 2013-09-15: qty 50

## 2013-09-15 MED ORDER — PIPERACILLIN-TAZOBACTAM IN DEX 2-0.25 GM/50ML IV SOLN
2.2500 g | Freq: Four times a day (QID) | INTRAVENOUS | Status: DC
  Administered 2013-09-15 – 2013-09-17 (×7): 2.25 g via INTRAVENOUS
  Filled 2013-09-15 (×11): qty 50

## 2013-09-15 MED ORDER — INSULIN ASPART 100 UNIT/ML ~~LOC~~ SOLN
1.0000 [IU] | SUBCUTANEOUS | Status: DC
  Administered 2013-09-15 – 2013-09-16 (×3): 2 [IU] via SUBCUTANEOUS
  Administered 2013-09-16: 1 [IU] via SUBCUTANEOUS

## 2013-09-15 MED ORDER — SODIUM CHLORIDE 0.9 % IV SOLN
Freq: Once | INTRAVENOUS | Status: AC
  Administered 2013-09-15: 10 mL/h via INTRAVENOUS

## 2013-09-15 MED ORDER — INSULIN ASPART 100 UNIT/ML ~~LOC~~ SOLN
10.0000 [IU] | Freq: Once | SUBCUTANEOUS | Status: AC
  Administered 2013-09-15: 10 [IU] via INTRAVENOUS
  Filled 2013-09-15: qty 1

## 2013-09-15 MED ORDER — DEXTROSE 50 % IV SOLN
50.0000 mL | Freq: Once | INTRAVENOUS | Status: AC
  Administered 2013-09-15: 50 mL via INTRAVENOUS
  Filled 2013-09-15: qty 50

## 2013-09-15 MED ORDER — SODIUM CHLORIDE 0.9 % IV SOLN
1500.0000 mg | Freq: Once | INTRAVENOUS | Status: AC
  Administered 2013-09-15: 1500 mg via INTRAVENOUS
  Filled 2013-09-15: qty 1500

## 2013-09-15 MED ORDER — CALCIUM CHLORIDE 10 % IV SOLN
1.0000 g | Freq: Once | INTRAVENOUS | Status: AC
  Administered 2013-09-15: 1 g via INTRAVENOUS
  Filled 2013-09-15: qty 10

## 2013-09-15 MED ORDER — SODIUM BICARBONATE 8.4 % IV SOLN
50.0000 meq | Freq: Once | INTRAVENOUS | Status: AC
  Administered 2013-09-15: 50 meq via INTRAVENOUS
  Filled 2013-09-15: qty 50

## 2013-09-15 MED ORDER — SODIUM CHLORIDE 0.9 % IV BOLUS (SEPSIS)
2000.0000 mL | Freq: Once | INTRAVENOUS | Status: AC
  Administered 2013-09-15: 2000 mL via INTRAVENOUS

## 2013-09-15 MED ORDER — DEXTROSE 5 % IV SOLN
INTRAVENOUS | Status: DC
  Administered 2013-09-15 – 2013-09-17 (×4): via INTRAVENOUS
  Filled 2013-09-15 (×9): qty 150

## 2013-09-15 MED ORDER — DEXTROSE 50 % IV SOLN
INTRAVENOUS | Status: AC
  Administered 2013-09-15: 50 mL via INTRAVENOUS
  Filled 2013-09-15: qty 50

## 2013-09-15 MED ORDER — CHLORHEXIDINE GLUCONATE 0.12 % MT SOLN
15.0000 mL | Freq: Two times a day (BID) | OROMUCOSAL | Status: DC
  Administered 2013-09-15 – 2013-09-19 (×5): 15 mL via OROMUCOSAL
  Filled 2013-09-15 (×10): qty 15

## 2013-09-15 MED ORDER — HEPARIN SODIUM (PORCINE) 5000 UNIT/ML IJ SOLN
5000.0000 [IU] | Freq: Three times a day (TID) | INTRAMUSCULAR | Status: DC
  Administered 2013-09-15 – 2013-09-20 (×15): 5000 [IU] via SUBCUTANEOUS
  Filled 2013-09-15 (×17): qty 1

## 2013-09-15 MED ORDER — CETYLPYRIDINIUM CHLORIDE 0.05 % MT LIQD
7.0000 mL | Freq: Two times a day (BID) | OROMUCOSAL | Status: DC
  Administered 2013-09-15 – 2013-09-18 (×5): 7 mL via OROMUCOSAL

## 2013-09-15 NOTE — H&P (Signed)
PULMONARY / CRITICAL CARE MEDICINE   Name: William Stark MRN: [id number] DOB: 30-Mar-1945    ADMISSION DATE:  09/15/2013 CONSULTATION DATE:  9/4  REFERRING MD :  Alice Rieger   CHIEF COMPLAINT:  hypotension and renal failure   INITIAL PRESENTATION:   68 year old male who presented to the ER 9/4 w/ cc: altered mental status. Pt had recently been treated w/ bactrim & cefuroxime X 2 weeks for RLE cellulitis. Completed 1 week of this but stopped as his urine turned orange. Over following week oral intake poor. Had frequent falls and got progressively weak and dizzy.  On presentation to ER found to have scr 10.72, hyperkalemia, and hypotension. His hypotension persisted in-spite of 4 liters of NS, therefore PCCM asked to admit  STUDIES:  Renal US 9/4: neg hydro   SIGNIFICANT EVENTS: 9/4 admitted w/ acute renal failure, + AG acidosis and hyperkalemia following ~1-2 weeks poor po intake and progressive weakness after treatment for RLE cellulitis    HISTORY OF PRESENT ILLNESS:   68 year old male who presented to the ER 9/4 w/ cc: altered mental status. Pt had recently been treated w/ bactrim & cefuroxime X 2 weeks for RLE cellulitis. Completed 1 week of this but stopped as his urine turned orange. Over the last 2-3 weeks he had several falls around the house. Has had some increase in SOB, Called EMS when he became to dizzy to function. On presentation to ER found to have scr 10.72, hyperkalemia, and hypotension and metabolic acidosis pH 4.09 His hypotension persisted in-spite of 4 liters of NS, therefore PCCM asked to admit.   Additional pertinent positives: chronic diarrhea since his colectomy in 2006. 30 lb wt loss over last year.     PAST MEDICAL HISTORY :  Past Medical History  Diagnosis Date  . Hypertension   . Diabetes mellitus without complication   . Cancer    Past Surgical History  Procedure Laterality Date  . Colon surgery    . Appendectomy    . Tonsillectomy    .  Cholecystectomy     Prior to Admission medications   Medication Sig Start Date End Date Taking? Authorizing Provider  ALPRAZolam Duanne Moron) 1 MG tablet Take 1 tablet by mouth 3 (three) times daily as needed for anxiety.  08/04/13  Yes Historical Provider, MD  glipiZIDE (GLUCOTROL) 10 MG tablet Take 10 mg by mouth daily before breakfast.   Yes Historical Provider, MD  ibuprofen (ADVIL,MOTRIN) 200 MG tablet Take 400 mg by mouth every 8 (eight) hours as needed for moderate pain.   Yes Historical Provider, MD  lisinopril (PRINIVIL,ZESTRIL) 10 MG tablet Take 10 mg by mouth daily.   Yes Historical Provider, MD  metFORMIN (GLUCOPHAGE) 1000 MG tablet Take 1,000 mg by mouth daily.    Yes Historical Provider, MD   No Known Allergies  FAMILY HISTORY:  History reviewed. No pertinent family history. SOCIAL HISTORY:  reports that he has been smoking.  He does not have any smokeless tobacco history on file. He reports that he does not drink alcohol or use illicit drugs.  REVIEW OF SYSTEMS:   Unable  SUBJECTIVE:  Confused at times. Uncomfortable but not in acute distress.  VITAL SIGNS: Temp:  [96.1 F (35.6 C)-97.4 F (36.3 C)] 97.4 F (36.3 C) (09/04 1242) Pulse Rate:  [98-110] 106 (09/04 1242) Resp:  [16-32] 18 (09/04 1242) BP: (60-99)/(28-64) 98/52 mmHg (09/04 1242) SpO2:  [94 %-100 %] 100 % (09/04 1242) HEMODYNAMICS:   VENTILATOR SETTINGS:  INTAKE / OUTPUT:  Intake/Output Summary (Last 24 hours) at 09/15/13 1306 Last data filed at 09/15/13 1005  Gross per 24 hour  Intake   2000 ml  Output     40 ml  Net   1960 ml    PHYSICAL EXAMINATION: General:  68 year old male, no acute distress but does have generalized discomfort.  Neuro:  Awake, sp slurred, oriented X 3, but confused at times  HEENT:  MM dry, lips cracked. No JVD  Cardiovascular:  rrr Lungs:  Decreased bases  Abdomen:  Soft, non-tender  Musculoskeletal:  Intact  Skin:  Dry   LABS:  PULMONARY  Recent Labs Lab  09/15/13 0858 09/15/13 1051  PHART  --  7.145*  PCO2ART  --  18.7*  PO2ART  --  83.0  HCO3 6.5* 6.6*  TCO2 7 7  O2SAT 74.0 94.0    CBC  Recent Labs Lab 09/15/13 0754  HGB 11.9*  HCT 35.5*  WBC 14.7*  PLT 242    COAGULATION No results found for this basename: INR,  in the last 168 hours  CARDIAC   Recent Labs Lab 09/15/13 1211  TROPONINI <0.30   No results found for this basename: PROBNP,  in the last 168 hours   CHEMISTRY  Recent Labs Lab 09/15/13 0754 09/15/13 1211  NA 137 137  K 6.3* 6.5*  CL 107 109  CO2 <7* <7*  GLUCOSE 178* 157*  BUN 141* 136*  CREATININE 10.72* 9.78*  CALCIUM 6.9* 6.2*   The CrCl is unknown because both a height and weight (above a minimum accepted value) are required for this calculation.   LIVER  Recent Labs Lab 09/15/13 0754  AST 402*  ALT 534*  ALKPHOS 328*  BILITOT 0.6  PROT 4.5*  ALBUMIN 2.2*     INFECTIOUS  Recent Labs Lab 09/15/13 0902  LATICACIDVEN 2.67*     ENDOCRINE CBG (last 3)   Recent Labs  09/15/13 0734 09/15/13 0819  GLUCAP 50* 121*         IMAGING x48h Dg Chest 1 View  09/15/2013   CLINICAL DATA:  Hypotension  EXAM: CHEST - 1 VIEW  COMPARISON:  PA and lateral chest of August 03, 2005  FINDINGS: The lungs are mildly hypoinflated. There is no focal infiltrate. The heart and pulmonary vascularity are normal. The mediastinum is normal in width. There is mild stable tortuosity of the descending thoracic aorta. There is no pleural effusion. The bony thorax is unremarkable.  IMPRESSION: There is no acute cardiopulmonary abnormality.   Electronically Signed   By: David  Martinique   On: 09/15/2013 08:09   US Renal  09/15/2013   CLINICAL DATA:  Acute renal failure  EXAM: RENAL/URINARY TRACT ULTRASOUND COMPLETE  COMPARISON:  CT 06/12/2005  FINDINGS: Right Kidney:  Length: 12.8 cm. Echogenicity within normal limits. No mass or hydronephrosis visualized.  Left Kidney:  Length: 12.1 cm. Normal renal  cortical thickness and echogenicity. No hydronephrosis. There is a 2.9 cm cyst within the inferior pole of the left kidney and a 2.6 cm cyst within the interpolar region left kidney.  Bladder:  Decompressed with Foley catheter  IMPRESSION: No hydronephrosis.   Electronically Signed   By: Lovey Newcomer M.D.   On: 09/15/2013 12:42      ASSESSMENT / PLAN:  PULMONARY OETT A: tachypnea in setting of metabolic acidosis (has excellent respiratory compensation)  P:   Admit to ICU Pulse ox PRN ABG if concerned about resp muscle fatigue but repeat  again at 4pm   CARDIOVASCULAR CVL A:   Circulatory shock/Hypovolemic shock in setting of profound acidosis w/ cardiac suppression as well as dehydration  P:  Cont IVFs Add bicarb for MIVF Admit to ICU Low threshold for central access   RENAL A:   Acute renal failure: prob multifactorial. Dehydration, Ace-I, recent bactrim +anion gap metabolic acidosis. Likely r/t renal failure but is on metformin . Doubt metformin related renal failure due to low lactate level Hyperkalemia  P:   Admit to ICU Ck lactate  Cont resuscitation efforts w/ IVFs D/c ACE-I and metformin  Bicarb gtt for hyperkalemia w/ close obs of chemistry  Renal consult pending   GASTROINTESTINAL A:   H/o colon ca  Wt loss > 30 lbs one year  Elevated/abnormal  LFTs: prob shock liver  P:   NPO except meds F/u LFTs am Might need to consider malignancy work-up as out pt. Defer to med team after ICU discharge  HEMATOLOGIC A:  Mild leukocytosis  Anemia (NOS) P:  Transfuse for hgb < 7 See ID section  [place] heparin   INFECTIOUS A:   Recent cellulitis  P:   BCx2 9/4>>> UC 9/4>>> Abx: vanc, start date 9/4, day 0/X (staff MD: dc vanc due to renal failure) Abx: zosyn, start date 9/4, day 0/x  ENDOCRINE A:  Diabetes w/ hyperglycemia  P:   ssi protocol   NEUROLOGIC A:   Acute encephalopathy in setting of acute uremia and metabolic acidosis  P:   RASS goal:  0 Supportive care  No narcs or benzos   GLOBAL 09/15/13: wife at bedside in ER and updted by STaff MD  TODAY'S SUMMARY: making urine. Scr trending the right way. He is hyperkalemic which we are treating. Suspect his renal failure is a mix of hypotension, ACE-I and possibly bactrim. Will go to the intensive care. Renal consulted. Low threshold for central access. Will cont fluid resuscitation and start bicarb gtt.   Marni Griffon ACNP Pulmonary and [place] Pager: [phone]  09/15/2013, 1:06 PM  STAFF NOTE: I, Dr Ann Lions have personally reviewed patient's available data, including medical history, events of note, physical examination and test results as part of my evaluation. I have discussed with resident/NP and other care providers such as pharmacist, RN and RRT.  In addition,  I personally evaluated patient and elicited key findings of : making urine. Scr trending the right way. He is hyperkalemic which we are treating. Suspect his renal failure is a mix of hypotension, ACE-I and possibly bactrim but doubt metformin. Will go to the intensive care. Renal consulted. Low threshold for central access. Will cont fluid resuscitation and start bicarb gtt.     Rest per NP/medical resident whose note is outlined above and that I agree with  The patient is critically ill with multiple organ systems failure and requires high complexity decision making for assessment and support, frequent evaluation and titration of therapies, application of advanced monitoring technologies and extensive interpretation of multiple databases.   Critical Care Time devoted to patient care services described in this note is  35  Minutes.  Dr. Brand Males, M.D., [facility].C.P Pulmonary and Critical Care Medicine Staff Physician [facility] Pager: [phone], If no answer or between  15:00h - 7:00h: call [phone]  09/15/2013 2:19  PM

## 2013-09-15 NOTE — ED Notes (Signed)
Admitting MD at bedside. Dr. Burnard Bunting waiting to send pt to stepdown unit until 4th liter of saline was administered to see effect on BP.

## 2013-09-15 NOTE — ED Notes (Signed)
Wife took bag with clothes and wallet.

## 2013-09-15 NOTE — Progress Notes (Signed)
[facility] Progress Note Patient Name: William Stark DOB: December 29, 1945 MRN: [id number]   Date of Service  09/15/2013  HPI/Events of Note  Line done  eICU Interventions  In good place, no ptxm, may use     Intervention Category Intermediate Interventions: Diagnostic test evaluation  Raylene Miyamoto. 09/15/2013, 10:02 PM

## 2013-09-15 NOTE — ED Notes (Signed)
Lauren RN spoke with house coverage and Alfredo Martinez spoke with Critical Care to change order from Step Down to ICU.

## 2013-09-15 NOTE — ED Notes (Signed)
Admitting team at bedside.

## 2013-09-15 NOTE — ED Notes (Signed)
Spoke to pharmacy. Will be sending bicarb drip.

## 2013-09-15 NOTE — ED Notes (Signed)
Lab called notified of critical values of potassium 6.5 calcium 6.2 and co2 less then 7. Notified Critical Care in the ED.

## 2013-09-15 NOTE — ED Notes (Signed)
Pt arrives via EMS from home with reported OD on 3 HCTZ pills last night around 6pm. Pt denies SI/HI at present. Pt oriented x4. Slurred speech noted. Pt with 18g LAC, 20g RAC. [place] 13.

## 2013-09-15 NOTE — Progress Notes (Signed)
Right IJ CVL ok to use per Md feinstein

## 2013-09-15 NOTE — ED Notes (Signed)
Patient transported to X-ray 

## 2013-09-15 NOTE — Progress Notes (Signed)
CRITICAL VALUE ALERT  Critical value received:  CO2 7  Date of notification:  09/15/2013  Time of notification:  0715  Critical value read back:Yes.    Nurse who received alert:  Corrinne Eagle  MD notified (1st page):  Simonne Maffucci  Time of first page:  0730  MD notified (2nd page):  Time of second page:  Responding MD:  Simonne Maffucci  Time MD responded:  6144

## 2013-09-15 NOTE — Progress Notes (Signed)
eLink Physician-Brief Progress Note Patient Name: William Stark DOB: 03-May-1945 MRN: [id number]   Date of Service  09/15/2013  HPI/Events of Note  New arrival from ED AKI in setting of hypovolemia, recent ABX use for cellulitis Metabolic acidosis Hypovolemic shock Seen by renal Currently comfortable receiving   eICU Interventions  Plan to continue bicarb gtt If MAP < 65 will place CVL q6h BMET     Intervention Category Evaluation Type: New Patient Evaluation  William Stark 09/15/2013, 5:19 PM

## 2013-09-15 NOTE — Progress Notes (Signed)
[facility] Progress Note Patient Name: William Stark DOB: 1945-09-29 MRN: [id number]   Date of Service  09/15/2013  HPI/Events of Note  hyperglycemia  eICU Interventions  ICU hyperglycemia protocol     Intervention Category Intermediate Interventions: Hyperglycemia - evaluation and treatment  Geryl Dohn 09/15/2013, 8:51 PM

## 2013-09-15 NOTE — ED Provider Notes (Signed)
CSN: [id number]     Arrival date & time 09/15/13  0721 History   First MD Initiated Contact with Patient 09/15/13 [phone]     Chief Complaint  Patient presents with  . Hypotension     (Consider location/radiation/quality/duration/timing/severity/associated sxs/prior Treatment) Patient is a 68 y.o. male presenting with altered mental status.  Altered Mental Status Presenting symptoms: confusion and lethargy   Severity:  Moderate Most recent episode:  Today Episode history:  Continuous Duration:  1 day Timing:  Constant Progression:  Unchanged Chronicity:  New Context: recent illness (cellulitis of RLE treated with bactrim/keflex)   Associated symptoms: slurred speech   Associated symptoms: no abdominal pain, no bladder incontinence, no decreased appetite, no fever, no headaches, no light-headedness, no nausea, no rash and no vomiting     Past Medical History  Diagnosis Date  . Hypertension   . Diabetes mellitus without complication   . Cancer    Past Surgical History  Procedure Laterality Date  . Colon surgery    . Appendectomy    . Tonsillectomy    . Cholecystectomy     History reviewed. No pertinent family history. History  Substance Use Topics  . Smoking status: Current Every Day Smoker  . Smokeless tobacco: Not on file  . Alcohol Use: No    Review of Systems  Constitutional: Negative for fever, chills and decreased appetite.  HENT: Negative for congestion, rhinorrhea and sore throat.   Eyes: Negative for photophobia and visual disturbance.  Respiratory: Negative for cough and shortness of breath.   Cardiovascular: Negative for chest pain and leg swelling.  Gastrointestinal: Negative for nausea, vomiting, abdominal pain, diarrhea and constipation.  Endocrine: Negative for polydipsia and polyuria.  Genitourinary: Negative for bladder incontinence, dysuria and hematuria.  Musculoskeletal: Negative for arthralgias and back pain.  Skin: Negative for color change and  rash.  Neurological: Negative for dizziness, syncope, light-headedness and headaches.  Hematological: Negative for adenopathy. Does not bruise/bleed easily.  Psychiatric/Behavioral: Positive for confusion.  All other systems reviewed and are negative.     Allergies  Review of patient's allergies indicates no known allergies.  Home Medications   Prior to Admission medications   Medication Sig Start Date End Date Taking? Authorizing Provider  ALPRAZolam Duanne Moron) 1 MG tablet Take 1 tablet by mouth 3 (three) times daily as needed for anxiety.  08/04/13  Yes Historical Provider, MD  glipiZIDE (GLUCOTROL) 10 MG tablet Take 10 mg by mouth daily before breakfast.   Yes Historical Provider, MD  ibuprofen (ADVIL,MOTRIN) 200 MG tablet Take 400 mg by mouth every 8 (eight) hours as needed for moderate pain.   Yes Historical Provider, MD  lisinopril (PRINIVIL,ZESTRIL) 10 MG tablet Take 10 mg by mouth daily.   Yes Historical Provider, MD  metFORMIN (GLUCOPHAGE) 1000 MG tablet Take 1,000 mg by mouth daily.    Yes Historical Provider, MD   BP 90/52  Pulse 104  Temp(Src) 96.1 F (35.6 C) (Rectal)  Resp 16  SpO2 94% Physical Exam  Vitals reviewed. Constitutional: He is oriented to person, place, and time. He appears well-developed and well-nourished.  HENT:  Head: Normocephalic and atraumatic.  Eyes: Conjunctivae and EOM are normal.  Neck: Normal range of motion. Neck supple.  Cardiovascular: Normal rate, regular rhythm and normal heart sounds.   Pulmonary/Chest: Effort normal and breath sounds normal. No respiratory distress.  Abdominal: He exhibits no distension. There is no tenderness. There is no rebound and no guarding.  Musculoskeletal: Normal range of motion.  Cervical back: Normal.       Thoracic back: Normal.       Lumbar back: Normal.  Neurological: He is alert and oriented to person, place, and time. He has normal strength and normal reflexes. No cranial nerve deficit or sensory  deficit. GCS eye subscore is 4. GCS verbal subscore is 5. GCS motor subscore is 6.  Skin: Skin is warm and dry.    ED Course  CRITICAL CARE Performed by: Debby Freiberg Authorized by: Debby Freiberg Total critical care time: 120 minutes Critical care time was exclusive of separately billable procedures and treating other patients. Critical care was necessary to treat or prevent imminent or life-threatening deterioration of the following conditions: shock. Critical care was time spent personally by me on the following activities: examination of patient, evaluation of patient's response to treatment and ordering and performing treatments and interventions.   (including critical care time) Labs Review Labs Reviewed  CBC WITH DIFFERENTIAL - Abnormal; Notable for the following:    WBC 14.7 (*)    RBC 3.94 (*)    Hemoglobin 11.9 (*)    HCT 35.5 (*)    RDW 15.6 (*)    Eosinophils Relative 24 (*)    Eosinophils Absolute 3.5 (*)    All other components within normal limits  COMPREHENSIVE METABOLIC PANEL - Abnormal; Notable for the following:    Potassium 6.3 (*)    CO2 <7 (*)    Glucose, Bld 178 (*)    BUN 141 (*)    Creatinine, Ser 10.72 (*)    Calcium 6.9 (*)    Total Protein 4.5 (*)    Albumin 2.2 (*)    AST 402 (*)    ALT 534 (*)    Alkaline Phosphatase 328 (*)    GFR calc non Af Amer 4 (*)    GFR calc Af Amer 5 (*)    All other components within normal limits  URINE RAPID DRUG SCREEN (HOSP PERFORMED) - Abnormal; Notable for the following:    Benzodiazepines POSITIVE (*)    All other components within normal limits  URINALYSIS, ROUTINE W REFLEX MICROSCOPIC - Abnormal; Notable for the following:    Color, Urine AMBER (*)    APPearance CLOUDY (*)    Hgb urine dipstick TRACE (*)    Bilirubin Urine SMALL (*)    Ketones, ur 15 (*)    Protein, ur 100 (*)    Leukocytes, UA TRACE (*)    All other components within normal limits  AMMONIA - Abnormal; Notable for the following:     Ammonia 69 (*)    All other components within normal limits  URINE MICROSCOPIC-ADD ON - Abnormal; Notable for the following:    Bacteria, UA FEW (*)    All other components within normal limits  I-STAT CG4 LACTIC ACID, ED - Abnormal; Notable for the following:    Lactic Acid, Venous 2.67 (*)    All other components within normal limits  I-STAT VENOUS BLOOD GAS, ED - Abnormal; Notable for the following:    pH, Ven 7.109 (*)    pCO2, Ven 20.3 (*)    pO2, Ven 49.0 (*)    Bicarbonate 6.5 (*)    Acid-base deficit 21.0 (*)    All other components within normal limits  CULTURE, BLOOD (ROUTINE X 2)  CULTURE, BLOOD (ROUTINE X 2)  ETHANOL  CBG MONITORING, ED    Imaging Review Dg Chest 1 View  09/15/2013   CLINICAL DATA:  Hypotension  EXAM:  CHEST - 1 VIEW  COMPARISON:  PA and lateral chest of August 03, 2005  FINDINGS: The lungs are mildly hypoinflated. There is no focal infiltrate. The heart and pulmonary vascularity are normal. The mediastinum is normal in width. There is mild stable tortuosity of the descending thoracic aorta. There is no pleural effusion. The bony thorax is unremarkable.  IMPRESSION: There is no acute cardiopulmonary abnormality.   Electronically Signed   By: David  Martinique   On: 09/15/2013 08:09     EKG Interpretation   Date/Time:  Friday September 15 2013 07:27:29 EDT Ventricular Rate:  104 PR Interval:  126 QRS Duration: 91 QT Interval:  346 QTC Calculation: 455 R Axis:   85 Text Interpretation:  Sinus tachycardia has replaced Normal sinus rhythm  Borderline right axis deviation Confirmed by Debby Freiberg [phone]) on  09/15/2013 8:17:09 AM      MDM   Final diagnoses:  Hypovolemic shock    68 y.o. male  with pertinent PMH of HTN, DM presents with altered mental status.  PT was reportedly normal yesterday, has been recently treated for cellulitis of RLE.  Unknown further details as pt is unable to tell why he was brought here and states "I feel great".  He had  glucose 53 via EMS, received oral glucose with improvement.  Pt did state that he took 3 BP pills last night.  He was hypotensive via EMS to 70 systolic, which improved after NS bolus x 750cc.  On arrival to the ED, initial BP >800 systolic.  Glucose checked and in the 50s.  He was given an amp of d50.  No infectious source identified, and initial oral temp unremarkable, so did not order antibiotics.  NS bolus x2L continued after BP shortly thereafter returned to 34-91 systolic.  This persisted despite fluid administration.  1amp d50 improved glucose.    Labs and imaging as above reviewed. Pt with signs of multiorgan shock.  He still was without symptoms, specifically without any abd pain.  Suspect etiology multifactorical, most likely hypovolemic, and pt did have a small amount of swelling in RLE, so treated with broad spectrum abx.  Consulted medicine for hospitalist admission.    1. Hypovolemic shock         Debby Freiberg, MD 09/15/13 1017

## 2013-09-15 NOTE — Progress Notes (Signed)
ANTIBIOTIC CONSULT NOTE - INITIAL  Pharmacy Consult for vancomycin and zosyn Indication: sepsis  No Known Allergies  Patient Measurements:    Body Weight: 95.3 kg  Vital Signs: Temp: 97.4 F (36.3 C) (09/04 1242) Temp src: Oral (09/04 1242) BP: 96/65 mmHg (09/04 1300) Pulse Rate: 107 (09/04 1300) Intake/Output from previous day:   Intake/Output from this shift: Total I/O In: 2000 [I.V.:2000] Out: 40 [Urine:40]  Labs:  Recent Labs  09/15/13 0754 09/15/13 0819 09/15/13 1211  WBC 14.7*  --   --   HGB 11.9*  --   --   PLT 242  --   --   LABCREA  --  254.38  --   CREATININE 10.72*  --  9.78*   The CrCl is unknown because both a height and weight (above a minimum accepted value) are required for this calculation. No results found for this basename: VANCOTROUGH, VANCOPEAK, VANCORANDOM, GENTTROUGH, GENTPEAK, GENTRANDOM, TOBRATROUGH, TOBRAPEAK, TOBRARND, AMIKACINPEAK, AMIKACINTROU, AMIKACIN,  in the last 72 hours   Microbiology: No results found for this or any previous visit (from the past 720 hour(s)).  Medical History: Past Medical History  Diagnosis Date  . Hypertension   . Diabetes mellitus without complication   . Cancer     Medications:  See medication history Assessment: 68 yo man to start broad spectrum antibiotics for sepsis.  He is admitted with ARF and hypotension. He received 1500 mg vancomycin and 3.375 gm of zosyn in the ED.  Goal of Therapy:  Vancomycin trough level 15-20 mcg/ml  Plan:  Cont zosyn 2.25 gm IV q6 hours Will f/u renal function and renal plans for additional vancomycin dosing. F/u cultures and clinical course  Thanks for allowing pharmacy to be a part of this patient's care.  Excell Seltzer, PharmD Clinical Pharmacist, [phone] 09/15/2013,1:47 PM

## 2013-09-15 NOTE — Progress Notes (Signed)
I have discussed the patient with Dr. Chase Caller of critical care.  Patient is very ill with severe acidosis with a PH 7.14, CO2 <7 and AKI with hyperkalemia. Hypotensive and has not responded to IV fluids. Patient will require ICU level of care. Dr Chase Caller recommended the patient receives a bicarbonate drip, and critical care will come and evaluate him. I also called Dr Florene Glen of renal service who will evaluate the patient. Patient will need to be admitted to ICU. I will cancel admission to the SDU.

## 2013-09-15 NOTE — ED Notes (Signed)
Spoke with patient who stated his name, birthday, address, wife's name and phone number correctly.  Stated to call his wife and tell her he is in the hospital and to check on their cats.

## 2013-09-15 NOTE — Procedures (Signed)
Central Venous Catheter Insertion Procedure Note HUIE GHUMAN [id number] [birth year]  Procedure: Insertion of Central Venous Catheter Indications: Assessment of intravascular volume, Drug and/or fluid administration and Frequent blood sampling  Procedure Details Consent: Risks of procedure as well as the alternatives and risks of each were explained to the (patient/caregiver).  Consent for procedure obtained. Time Out: Verified patient identification, verified procedure, site/side was marked, verified correct patient position, special equipment/implants available, medications/allergies/relevent history reviewed, required imaging and test results available.  Performed  Maximum sterile technique was used including antiseptics, cap, gloves, gown, hand hygiene, mask and sheet. Skin prep: Chlorhexidine; local anesthetic administered A antimicrobial bonded/coated triple lumen catheter was placed in the right internal jugular vein using the Seldinger technique.  Bedside ultrasound was used to guide the procedure.    Evaluation Blood flow good Complications: No apparent complications Patient did tolerate procedure well. Chest X-ray ordered to verify placement.  CXR: normal.  Rashonda Warrior 09/15/2013, 10:58 PM

## 2013-09-15 NOTE — ED Notes (Signed)
Lab called stated blood tubes sent and blue top not included. Need to re order protime inr.

## 2013-09-15 NOTE — ED Notes (Addendum)
Lab called critical lab c02 is 7. Paged Critical Care.

## 2013-09-15 NOTE — ED Notes (Signed)
Notified RN of CBG 50

## 2013-09-15 NOTE — Progress Notes (Signed)
[facility] Progress Note Patient Name: William Stark DOB: Sep 30, 1945 MRN: [id number]   Date of Service  09/15/2013  HPI/Events of Note  Hypotension persists despite 5.5 L fluid  eICU Interventions  Fellow to place CVL for CVP monitoring     Intervention Category Intermediate Interventions: Hypotension - evaluation and management  MCQUAID, DOUGLAS 09/15/2013, 8:44 PM

## 2013-09-15 NOTE — ED Notes (Signed)
MD at bedside. Admitting at bedside. Will call critical. RN  Marya Amsler notified.

## 2013-09-15 NOTE — Consult Note (Signed)
68 year old male who presented today altered mental status. Pt had recently been treated w/ bactrim & cefuroxime X 2 weeks for RLE cellulitis. He apparently completed 1 week of this but stopped as his urine turned dark yellow. His appetitie and oral intake was poor. No vomiting or diarrhea. He had difficulty ambulating and got progressively weak and dizzy. Of note his admission medications include an ace inhibitor, NSAID and metformin. On presentation to ER found to have scr 10.72, potassium of 6.5, bicarb<7, and BP 60/47. His hypotension persisted in-spite of 4 liters of NS, therefore PCCM asked to admit.  On 7/26 creat was 1.$RemoveBef'4mg'xMWYvstywS$ /dl.   Past Medical History  Diagnosis Date  . Hypertension   . Diabetes mellitus without complication   . Cancer    Past Surgical History  Procedure Laterality Date  . Colon surgery    . Appendectomy    . Tonsillectomy    . Cholecystectomy     Social History:  reports that he has been smoking.  He does not have any smokeless tobacco history on file. He reports that he does not drink alcohol or use illicit drugs. Allergies: No Known Allergies History reviewed. No pertinent family history.  Medications:  Prior to Admission:  Prescriptions prior to admission  Medication Sig Dispense Refill  . ALPRAZolam (XANAX) 1 MG tablet Take 1 tablet by mouth 3 (three) times daily as needed for anxiety.       Marland Kitchen glipiZIDE (GLUCOTROL XL) 5 MG 24 hr tablet Take 5 mg by mouth daily with breakfast.      . hydrochlorothiazide (HYDRODIURIL) 25 MG tablet Take 25 mg by mouth daily.      Marland Kitchen ibuprofen (ADVIL,MOTRIN) 200 MG tablet Take 400 mg by mouth every 8 (eight) hours as needed for moderate pain.      Marland Kitchen lisinopril (PRINIVIL,ZESTRIL) 10 MG tablet Take 10 mg by mouth daily.      . metFORMIN (GLUCOPHAGE) 1000 MG tablet Take 1,000 mg by mouth daily.       . cefUROXime (CEFTIN) 500 MG tablet Take 500 mg by mouth 2 (two) times daily with a meal. For 14 days. Starting 8/20.      Marland Kitchen  sulfamethoxazole-trimethoprim (BACTRIM DS) 800-160 MG per tablet Take 1 tablet by mouth 2 (two) times daily. For 14 days. Starting 8/20       Scheduled: . heparin  5,000 Units Subcutaneous 3 times per day  . piperacillin-tazobactam (ZOSYN)  IV  2.25 g Intravenous Q6H   ROS: as per HPI otherwise noncontributory  Blood pressure 94/53, pulse 114, temperature 98.3 F (36.8 C), temperature source Oral, resp. rate 28, height 6' (1.829 m), weight 88.9 kg (195 lb 15.8 oz), SpO2 96.00%.  General appearance: alert and cooperative Head: Normocephalic, without obvious abnormality, atraumatic Eyes: negative Resp: clear to auscultation bilaterally Chest wall: no tenderness Cardio: regular rate and rhythm, S1, S2 normal, no murmur, click, rub or gallop GI: soft, non-tender; bowel sounds normal; no masses,  no organomegaly Extremities: edema 1+ Skin: mild hyperpigmentaiton at feet Neurologic: Grossly normal  Results for orders placed during the hospital encounter of 09/15/13 (from the past 48 hour(s))  CBG MONITORING, ED     Status: Abnormal   Collection Time    09/15/13  7:34 AM      Result Value Ref Range   Glucose-Capillary 50 (*) 70 - 99 mg/dL   Comment 1 Notify RN    CBC WITH DIFFERENTIAL     Status: Abnormal   Collection Time  09/15/13  7:54 AM      Result Value Ref Range   WBC 14.7 (*) 4.0 - 10.5 K/uL   Comment: REPEATED TO VERIFY   RBC 3.94 (*) 4.22 - 5.81 MIL/uL   Hemoglobin 11.9 (*) 13.0 - 17.0 g/dL   Comment: REPEATED TO VERIFY   HCT 35.5 (*) 39.0 - 52.0 %   MCV 90.1  78.0 - 100.0 fL   MCH 30.2  26.0 - 34.0 pg   MCHC 33.5  30.0 - 36.0 g/dL   RDW 15.6 (*) 11.5 - 15.5 %   Platelets 242  150 - 400 K/uL   Neutrophils Relative % 45  43 - 77 %   Lymphocytes Relative 24  12 - 46 %   Monocytes Relative 6  3 - 12 %   Eosinophils Relative 24 (*) 0 - 5 %   Basophils Relative 1  0 - 1 %   Neutro Abs 6.7  1.7 - 7.7 K/uL   Lymphs Abs 3.5  0.7 - 4.0 K/uL   Monocytes Absolute 0.9  0.1  - 1.0 K/uL   Eosinophils Absolute 3.5 (*) 0.0 - 0.7 K/uL   Basophils Absolute 0.1  0.0 - 0.1 K/uL   RBC Morphology BURR CELLS     WBC Morphology ATYPICAL LYMPHOCYTES     Comment: MILD LEFT SHIFT (1-5% METAS, OCC MYELO, OCC BANDS)     DOHLE BODIES  COMPREHENSIVE METABOLIC PANEL     Status: Abnormal   Collection Time    09/15/13  7:54 AM      Result Value Ref Range   Sodium 137  137 - 147 mEq/L   Potassium 6.3 (*) 3.7 - 5.3 mEq/L   Chloride 107  96 - 112 mEq/L   CO2 <7 (*) 19 - 32 mEq/L   Comment: CRITICAL RESULT CALLED TO, READ BACK BY AND VERIFIED WITH:     KOHUT,N RN @ [phone] 09/15/13 LEONARD,A   Glucose, Bld 178 (*) 70 - 99 mg/dL   BUN 141 (*) 6 - 23 mg/dL   Creatinine, Ser 10.72 (*) 0.50 - 1.35 mg/dL   Calcium 6.9 (*) 8.4 - 10.5 mg/dL   Total Protein 4.5 (*) 6.0 - 8.3 g/dL   Albumin 2.2 (*) 3.5 - 5.2 g/dL   AST 402 (*) 0 - 37 U/L   ALT 534 (*) 0 - 53 U/L   Alkaline Phosphatase 328 (*) 39 - 117 U/L   Total Bilirubin 0.6  0.3 - 1.2 mg/dL   GFR calc non Af Amer 4 (*) >90 mL/min   GFR calc Af Amer 5 (*) >90 mL/min   Comment: (NOTE)     The eGFR has been calculated using the CKD EPI equation.     This calculation has not been validated in all clinical situations.     eGFR's persistently <90 mL/min signify possible Chronic Kidney     Disease.   Anion gap NOT CALCULATED  5 - 15  ETHANOL     Status: None   Collection Time    09/15/13  7:54 AM      Result Value Ref Range   Alcohol, Ethyl (B) <11  0 - 11 mg/dL   Comment:            LOWEST DETECTABLE LIMIT FOR     SERUM ALCOHOL IS 11 mg/dL     FOR MEDICAL PURPOSES ONLY  SALICYLATE LEVEL     Status: Abnormal   Collection Time    09/15/13  7:54 AM  Result Value Ref Range   Salicylate Lvl <9.3 (*) 2.8 - 20.0 mg/dL  OSMOLALITY     Status: Abnormal   Collection Time    09/15/13  7:54 AM      Result Value Ref Range   Osmolality 343 (*) 275 - 300 mOsm/kg   Comment: Performed at [facility]  ([place])     Status: Abnormal   Collection Time    09/15/13  8:19 AM      Result Value Ref Range   Opiates NONE DETECTED  NONE DETECTED   Cocaine NONE DETECTED  NONE DETECTED   Benzodiazepines POSITIVE (*) NONE DETECTED   Amphetamines NONE DETECTED  NONE DETECTED   Tetrahydrocannabinol NONE DETECTED  NONE DETECTED   Barbiturates NONE DETECTED  NONE DETECTED   Comment:            DRUG SCREEN FOR MEDICAL PURPOSES     ONLY.  IF CONFIRMATION IS NEEDED     FOR ANY PURPOSE, NOTIFY LAB     WITHIN 5 DAYS.                LOWEST DETECTABLE LIMITS     FOR URINE DRUG SCREEN     Drug Class       Cutoff (ng/mL)     Amphetamine      1000     Barbiturate      200     Benzodiazepine   716     Tricyclics       967     Opiates          300     Cocaine          300     THC              50  URINALYSIS, ROUTINE W REFLEX MICROSCOPIC     Status: Abnormal   Collection Time    09/15/13  8:19 AM      Result Value Ref Range   Color, Urine AMBER (*) YELLOW   Comment: BIOCHEMICALS MAY BE AFFECTED BY COLOR   APPearance CLOUDY (*) CLEAR   Specific Gravity, Urine 1.024  1.005 - 1.030   pH 5.0  5.0 - 8.0   Glucose, UA NEGATIVE  NEGATIVE mg/dL   Hgb urine dipstick TRACE (*) NEGATIVE   Bilirubin Urine SMALL (*) NEGATIVE   Ketones, ur 15 (*) NEGATIVE mg/dL   Protein, ur 100 (*) NEGATIVE mg/dL   Urobilinogen, UA 0.2  0.0 - 1.0 mg/dL   Nitrite NEGATIVE  NEGATIVE   Leukocytes, UA TRACE (*) NEGATIVE  URINE MICROSCOPIC-ADD ON     Status: Abnormal   Collection Time    09/15/13  8:19 AM      Result Value Ref Range   WBC, UA 0-2  <3 WBC/hpf   RBC / HPF 0-2  <3 RBC/hpf   Bacteria, UA FEW (*) RARE   Urine-Other AMORPHOUS URATES/PHOSPHATES    SODIUM, URINE, RANDOM     Status: None   Collection Time    09/15/13  8:19 AM      Result Value Ref Range   Sodium, Ur 33    CREATININE, URINE, RANDOM     Status: None   Collection Time    09/15/13  8:19 AM      Result Value Ref Range   Creatinine, Urine  254.38    CBG MONITORING, ED     Status: Abnormal   Collection Time  09/15/13  8:19 AM      Result Value Ref Range   Glucose-Capillary 121 (*) 70 - 99 mg/dL  AMMONIA     Status: Abnormal   Collection Time    09/15/13  8:30 AM      Result Value Ref Range   Ammonia 69 (*) 11 - 60 umol/L  I-STAT VENOUS BLOOD GAS, ED     Status: Abnormal   Collection Time    09/15/13  8:58 AM      Result Value Ref Range   pH, Ven 7.109 (*) 7.250 - 7.300   pCO2, Ven 20.3 (*) 45.0 - 50.0 mmHg   pO2, Ven 49.0 (*) 30.0 - 45.0 mmHg   Bicarbonate 6.5 (*) 20.0 - 24.0 mEq/L   TCO2 7  0 - 100 mmol/L   O2 Saturation 74.0     Acid-base deficit 21.0 (*) 0.0 - 2.0 mmol/L   Patient temperature 36.2 C     Collection site RADIAL, ALLEN'S TEST ACCEPTABLE     Drawn by RT     Sample type VENOUS     Comment NOTIFIED PHYSICIAN    I-STAT CG4 LACTIC ACID, ED     Status: Abnormal   Collection Time    09/15/13  9:02 AM      Result Value Ref Range   Lactic Acid, Venous 2.67 (*) 0.5 - 2.2 mmol/L  I-STAT ARTERIAL BLOOD GAS, ED     Status: Abnormal   Collection Time    09/15/13 10:51 AM      Result Value Ref Range   pH, Arterial 7.145 (*) 7.350 - 7.450   pCO2 arterial 18.7 (*) 35.0 - 45.0 mmHg   pO2, Arterial 83.0  80.0 - 100.0 mmHg   Bicarbonate 6.6 (*) 20.0 - 24.0 mEq/L   TCO2 7  0 - 100 mmol/L   O2 Saturation 94.0     Acid-base deficit 21.0 (*) 0.0 - 2.0 mmol/L   Patient temperature 95.6 F     Collection site RADIAL, ALLEN'S TEST ACCEPTABLE     Drawn by RT     Sample type ARTERIAL     Comment NOTIFIED PHYSICIAN    BASIC METABOLIC PANEL     Status: Abnormal   Collection Time    09/15/13 12:11 PM      Result Value Ref Range   Sodium 137  137 - 147 mEq/L   Potassium 6.5 (*) 3.7 - 5.3 mEq/L   Comment: CRITICAL RESULT CALLED TO, READ BACK BY AND VERIFIED WITH:     GREG NIKOLICH,RN AT 5638 [birth year] BY ZBEECH.   Chloride 109  96 - 112 mEq/L   CO2 <7 (*) 19 - 32 mEq/L   Comment: CRITICAL RESULT CALLED TO, READ  BACK BY AND VERIFIED WITH:     GREG NIKOLICH,RN AT 3329 05/[birth year]/86 BY ZBEECH.   Glucose, Bld 157 (*) 70 - 99 mg/dL   BUN 136 (*) 6 - 23 mg/dL   Creatinine, Ser 9.78 (*) 0.50 - 1.35 mg/dL   Calcium 6.2 (*) 8.4 - 10.5 mg/dL   Comment: CRITICAL RESULT CALLED TO, READ BACK BY AND VERIFIED WITH:     GREG NIKOLICH,RN AT 4166 0/6/30 BY ZBEECH.   GFR calc non Af Amer 5 (*) >90 mL/min   GFR calc Af Amer 6 (*) >90 mL/min   Comment: (NOTE)     The eGFR has been calculated using the CKD EPI equation.     This calculation has not been validated in all clinical situations.  eGFR's persistently <90 mL/min signify possible Chronic Kidney     Disease.   Anion gap NOT CALCULATED  5 - 15  TROPONIN I     Status: None   Collection Time    09/15/13 12:11 PM      Result Value Ref Range   Troponin I <0.30  <0.30 ng/mL   Comment:            Due to the release kinetics of cTnI,     a negative result within the first hours     of the onset of symptoms does not rule out     myocardial infarction with certainty.     If myocardial infarction is still suspected,     repeat the test at appropriate intervals.  MAGNESIUM     Status: None   Collection Time    09/15/13  1:42 PM      Result Value Ref Range   Magnesium 2.5  1.5 - 2.5 mg/dL  PHOSPHORUS     Status: Abnormal   Collection Time    09/15/13  1:42 PM      Result Value Ref Range   Phosphorus 5.8 (*) 2.3 - 4.6 mg/dL  BASIC METABOLIC PANEL     Status: Abnormal   Collection Time    09/15/13  3:00 PM      Result Value Ref Range   Sodium 141  137 - 147 mEq/L   Potassium 5.4 (*) 3.7 - 5.3 mEq/L   Comment: DELTA CHECK NOTED   Chloride 113 (*) 96 - 112 mEq/L   CO2 7 (*) 19 - 32 mEq/L   Comment: CRITICAL RESULT CALLED TO, READ BACK BY AND VERIFIED WITH:     G NIKOLICH,RN 1448 [birth year] D BRADLEY   Glucose, Bld 141 (*) 70 - 99 mg/dL   BUN 126 (*) 6 - 23 mg/dL   Creatinine, Ser 9.20 (*) 0.50 - 1.35 mg/dL   Calcium 6.8 (*) 8.4 - 10.5 mg/dL   GFR calc non Af  Amer 5 (*) >90 mL/min   GFR calc Af Amer 6 (*) >90 mL/min   Comment: (NOTE)     The eGFR has been calculated using the CKD EPI equation.     This calculation has not been validated in all clinical situations.     eGFR's persistently <90 mL/min signify possible Chronic Kidney     Disease.   Anion gap 21 (*) 5 - 15  PROTIME-INR     Status: Abnormal   Collection Time    09/15/13  3:17 PM      Result Value Ref Range   Prothrombin Time 17.1 (*) 11.6 - 15.2 seconds   INR 1.39  0.00 - 1.49  GLUCOSE, CAPILLARY     Status: Abnormal   Collection Time    09/15/13  4:48 PM      Result Value Ref Range   Glucose-Capillary 125 (*) 70 - 99 mg/dL   Dg Chest 1 View  09/15/2013   CLINICAL DATA:  Hypotension  EXAM: CHEST - 1 VIEW  COMPARISON:  PA and lateral chest of August 03, 2005  FINDINGS: The lungs are mildly hypoinflated. There is no focal infiltrate. The heart and pulmonary vascularity are normal. The mediastinum is normal in width. There is mild stable tortuosity of the descending thoracic aorta. There is no pleural effusion. The bony thorax is unremarkable.  IMPRESSION: There is no acute cardiopulmonary abnormality.   Electronically Signed   By: David  Martinique   On:  09/15/2013 08:09   US Renal  09/15/2013   CLINICAL DATA:  Acute renal failure  EXAM: RENAL/URINARY TRACT ULTRASOUND COMPLETE  COMPARISON:  CT 06/12/2005  FINDINGS: Right Kidney:  Length: 12.8 cm. Echogenicity within normal limits. No mass or hydronephrosis visualized.  Left Kidney:  Length: 12.1 cm. Normal renal cortical thickness and echogenicity. No hydronephrosis. There is a 2.9 cm cyst within the inferior pole of the left kidney and a 2.6 cm cyst within the interpolar region left kidney.  Bladder:  Decompressed with Foley catheter  IMPRESSION: No hydronephrosis.   Electronically Signed   By: Lovey Newcomer M.D.   On: 09/15/2013 12:42    Assessment:  1 AKI due to hypovolemic shock, worsened on an ACE-I and +/- NSAIDS 2 Metabolic acidosis  with elevated lactate 3 Hyperkalemia, improving 4 Dehydration  Plan: 1 Sodium bicarbonate volume expansion and serial labs 2 Check CK 3 We will be available as needed for dialytic intervention  Tresha Muzio C 09/15/2013, 5:05 PM

## 2013-09-16 DIAGNOSIS — A419 Sepsis, unspecified organism: Secondary | ICD-10-CM | POA: Diagnosis not present

## 2013-09-16 DIAGNOSIS — N179 Acute kidney failure, unspecified: Secondary | ICD-10-CM

## 2013-09-16 DIAGNOSIS — R578 Other shock: Secondary | ICD-10-CM

## 2013-09-16 DIAGNOSIS — G9340 Encephalopathy, unspecified: Secondary | ICD-10-CM

## 2013-09-16 LAB — BASIC METABOLIC PANEL
ANION GAP: 15 (ref 5–15)
ANION GAP: 20 — AB (ref 5–15)
BUN: 122 mg/dL — ABNORMAL HIGH (ref 6–23)
BUN: 107 mg/dL — ABNORMAL HIGH (ref 6–23)
CALCIUM: 6.7 mg/dL — AB (ref 8.4–10.5)
CO2: 10 mEq/L — CL (ref 19–32)
CO2: 16 mEq/L — ABNORMAL LOW (ref 19–32)
Calcium: 6.4 mg/dL — CL (ref 8.4–10.5)
Chloride: 115 mEq/L — ABNORMAL HIGH (ref 96–112)
Chloride: 118 mEq/L — ABNORMAL HIGH (ref 96–112)
Creatinine, Ser: 7.78 mg/dL — ABNORMAL HIGH (ref 0.50–1.35)
Creatinine, Ser: 6.16 mg/dL — ABNORMAL HIGH (ref 0.50–1.35)
GFR calc Af Amer: 10 mL/min — ABNORMAL LOW (ref >90)
GFR, EST AFRICAN AMERICAN: 7 mL/min — AB (ref >90)
GFR, EST NON AFRICAN AMERICAN: 6 mL/min — AB (ref >90)
GFR, EST NON AFRICAN AMERICAN: 8 mL/min — AB (ref >90)
Glucose, Bld: 192 mg/dL — ABNORMAL HIGH (ref 70–99)
Glucose, Bld: 201 mg/dL — ABNORMAL HIGH (ref 70–99)
Potassium: 5.3 mEq/L (ref 3.7–5.3)
Potassium: 4.4 mEq/L (ref 3.7–5.3)
SODIUM: 145 meq/L (ref 137–147)
Sodium: 149 mEq/L — ABNORMAL HIGH (ref 137–147)

## 2013-09-16 LAB — COMPREHENSIVE METABOLIC PANEL
ALT: 533 U/L — ABNORMAL HIGH (ref 0–53)
AST: 315 U/L — ABNORMAL HIGH (ref 0–37)
Albumin: 2 g/dL — ABNORMAL LOW (ref 3.5–5.2)
Alkaline Phosphatase: 339 U/L — ABNORMAL HIGH (ref 39–117)
Anion gap: 17 — ABNORMAL HIGH (ref 5–15)
BILIRUBIN TOTAL: 0.7 mg/dL (ref 0.3–1.2)
BUN: 118 mg/dL — ABNORMAL HIGH (ref 6–23)
CHLORIDE: 116 meq/L — AB (ref 96–112)
CO2: 13 meq/L — AB (ref 19–32)
CREATININE: 7.32 mg/dL — AB (ref 0.50–1.35)
Calcium: 6.6 mg/dL — ABNORMAL LOW (ref 8.4–10.5)
GFR calc Af Amer: 8 mL/min — ABNORMAL LOW (ref >90)
GFR, EST NON AFRICAN AMERICAN: 7 mL/min — AB (ref >90)
GLUCOSE: 159 mg/dL — AB (ref 70–99)
Potassium: 4.9 mEq/L (ref 3.7–5.3)
Sodium: 146 mEq/L (ref 137–147)
Total Protein: 4.3 g/dL — ABNORMAL LOW (ref 6.0–8.3)

## 2013-09-16 LAB — CBC
HCT: 33.4 % — ABNORMAL LOW (ref 39.0–52.0)
Hemoglobin: 11.6 g/dL — ABNORMAL LOW (ref 13.0–17.0)
MCH: 30.1 pg (ref 26.0–34.0)
MCHC: 34.7 g/dL (ref 30.0–36.0)
MCV: 86.5 fL (ref 78.0–100.0)
PLATELETS: 253 10*3/uL (ref 150–400)
RBC: 3.86 MIL/uL — ABNORMAL LOW (ref 4.22–5.81)
RDW: 15.5 % (ref 11.5–15.5)
WBC: 11.6 10*3/uL — AB (ref 4.0–10.5)

## 2013-09-16 LAB — GLUCOSE, CAPILLARY
GLUCOSE-CAPILLARY: 156 mg/dL — AB (ref 70–99)
Glucose-Capillary: 125 mg/dL — ABNORMAL HIGH (ref 70–99)
Glucose-Capillary: 174 mg/dL — ABNORMAL HIGH (ref 70–99)
Glucose-Capillary: 198 mg/dL — ABNORMAL HIGH (ref 70–99)
Glucose-Capillary: 200 mg/dL — ABNORMAL HIGH (ref 70–99)
Glucose-Capillary: 342 mg/dL — ABNORMAL HIGH (ref 70–99)

## 2013-09-16 LAB — TROPONIN I: Troponin I: <0.3 ng/mL (ref <0.30)

## 2013-09-16 MED ORDER — SODIUM CHLORIDE 0.9 % IV SOLN
1.0000 g | Freq: Once | INTRAVENOUS | Status: AC
  Administered 2013-09-16: 1 g via INTRAVENOUS
  Filled 2013-09-16: qty 10

## 2013-09-16 MED ORDER — INSULIN ASPART 100 UNIT/ML ~~LOC~~ SOLN
0.0000 [IU] | Freq: Three times a day (TID) | SUBCUTANEOUS | Status: DC
  Administered 2013-09-16: 7 [IU] via SUBCUTANEOUS
  Administered 2013-09-16: 2 [IU] via SUBCUTANEOUS
  Administered 2013-09-17: 1 [IU] via SUBCUTANEOUS
  Administered 2013-09-17 (×2): 3 [IU] via SUBCUTANEOUS
  Administered 2013-09-17: 2 [IU] via SUBCUTANEOUS
  Administered 2013-09-18 (×2): 3 [IU] via SUBCUTANEOUS
  Administered 2013-09-19: 2 [IU] via SUBCUTANEOUS
  Administered 2013-09-20: 1 [IU] via SUBCUTANEOUS

## 2013-09-16 NOTE — Progress Notes (Signed)
Assessment:  1 AKI due to hypovolemic shock, worsened on an ACE-I and +/- NSAIDS  2 Metabolic acidosis with elevated lactate  3 Hyperkalemia, improved  4 Dehydration  Plan:  1 Continue resuscitation with fluid. 3 We will sign off  Subjective: Interval History: Improved  Objective: Vital signs in last 24 hours: Temp:  [96.1 F (35.6 C)-99 F (37.2 C)] 98.8 F (37.1 C) (09/05 0753) Pulse Rate:  [100-132] 107 (09/05 0600) Resp:  [16-36] 20 (09/05 0500) BP: (60-136)/(46-79) 100/56 mmHg (09/05 0600) SpO2:  [94 %-100 %] 99 % (09/05 0600) Weight:  [88.9 kg (195 lb 15.8 oz)] 88.9 kg (195 lb 15.8 oz) (09/04 1600) Weight change:   Intake/Output from previous day: 09/04 0701 - 09/05 0700 In: 8304.2 [I.V.:7254.2; IV Piggyback:1050] Out: 1805 [Urine:1805] Intake/Output this shift:    General appearance: alert and cooperative GI: soft, non-tender; bowel sounds normal; no masses,  no organomegaly feet swollen r>l  Lab Results:  Recent Labs  09/15/13 0754 09/16/13 0424  WBC 14.7* 11.6*  HGB 11.9* 11.6*  HCT 35.5* 33.4*  PLT 242 253   BMET:  Recent Labs  09/16/13 0037 09/16/13 0424  NA 145 146  K 5.3 4.9  CL 115* 116*  CO2 10* 13*  GLUCOSE 192* 159*  BUN 122* 118*  CREATININE 7.78* 7.32*  CALCIUM 6.4* 6.6*   No results found for this basename: PTH,  in the last 72 hours Iron Studies: No results found for this basename: IRON, TIBC, TRANSFERRIN, FERRITIN,  in the last 72 hours Studies/Results: Dg Chest 1 View  09/15/2013   CLINICAL DATA:  Hypotension  EXAM: CHEST - 1 VIEW  COMPARISON:  PA and lateral chest of August 03, 2005  FINDINGS: The lungs are mildly hypoinflated. There is no focal infiltrate. The heart and pulmonary vascularity are normal. The mediastinum is normal in width. There is mild stable tortuosity of the descending thoracic aorta. There is no pleural effusion. The bony thorax is unremarkable.  IMPRESSION: There is no acute cardiopulmonary abnormality.    Electronically Signed   By: David  Martinique   On: 09/15/2013 08:09   US Renal  09/15/2013   CLINICAL DATA:  Acute renal failure  EXAM: RENAL/URINARY TRACT ULTRASOUND COMPLETE  COMPARISON:  CT 06/12/2005  FINDINGS: Right Kidney:  Length: 12.8 cm. Echogenicity within normal limits. No mass or hydronephrosis visualized.  Left Kidney:  Length: 12.1 cm. Normal renal cortical thickness and echogenicity. No hydronephrosis. There is a 2.9 cm cyst within the inferior pole of the left kidney and a 2.6 cm cyst within the interpolar region left kidney.  Bladder:  Decompressed with Foley catheter  IMPRESSION: No hydronephrosis.   Electronically Signed   By: Lovey Newcomer M.D.   On: 09/15/2013 12:42   Dg Chest Port 1 View  09/15/2013   CLINICAL DATA:  Central line insertion.  EXAM: PORTABLE CHEST - 1 VIEW  COMPARISON:  09/16/2003  FINDINGS: Interval placement of right central venous catheter with tip over the mid SVC region. No pneumothorax. Shallow inspiration. Hazy opacity in the left lung base suggesting infiltration or atelectasis. Right lung is grossly clear. Normal heart size and pulmonary vascularity.  IMPRESSION: Right central venous catheter tip over the mid SVC region. No pneumothorax. Hazy infiltration or atelectasis in the left lung base.   Electronically Signed   By: Lucienne Capers M.D.   On: 09/15/2013 21:53   Scheduled: . antiseptic oral rinse  7 mL Mouth Rinse q12n4p  . chlorhexidine  15 mL Mouth  Rinse BID  . heparin  5,000 Units Subcutaneous 3 times per day  . insulin aspart  1-3 Units Subcutaneous 6 times per day  . piperacillin-tazobactam (ZOSYN)  IV  2.25 g Intravenous Q6H  . pneumococcal 23 valent vaccine  0.5 mL Intramuscular Tomorrow-1000     LOS: 1 day   Kemper Heupel C 09/16/2013,8:30 AM

## 2013-09-16 NOTE — Progress Notes (Signed)
PULMONARY / CRITICAL CARE MEDICINE  Name: William Stark MRN: [id number] DOB: 29-Oct-1945    ADMISSION DATE:  09/15/2013 CONSULTATION DATE:  09/15/2013  REFERRING MD :  Alice Rieger   CHIEF COMPLAINT:  Hypotension and renal failure   INITIAL PRESENTATION: 68 yo presented 9/4 with altered mental status, hypovolemia and acute renal failure.  STUDIES / EVENTS:  9/4  Renal US >>> no hydronephrosis  INTERVAL HISTORY: Per RN mentation is improving  VITAL SIGNS: Temp:  [97.4 F (36.3 C)-99 F (37.2 C)] 98.8 F (37.1 C) (09/05 0753) Pulse Rate:  [103-132] 115 (09/05 1000) Resp:  [17-36] 23 (09/05 1000) BP: (80-136)/(48-79) 102/60 mmHg (09/05 1000) SpO2:  [95 %-100 %] 98 % (09/05 1000) Weight:  [88.9 kg (195 lb 15.8 oz)] 88.9 kg (195 lb 15.8 oz) (09/04 1600)  HEMODYNAMICS: CVP:  [2 mmHg-5 mmHg] 2 mmHg  VENTILATOR SETTINGS:   INTAKE / OUTPUT: Intake/Output     09/04 0701 - 09/05 0700 09/05 0701 - 09/06 0700   I.V. (mL/kg) 7379.2 (83) 250 (2.8)   IV Piggyback 1050    Total Intake(mL/kg) 8429.2 (94.8) 250 (2.8)   Urine (mL/kg/hr) 1805 350 (1.1)   Total Output 1805 350   Net +6624.2 -100        Stool Occurrence 2 x     PHYSICAL EXAMINATION: General:  No distress Neuro:  Awake but confused HEENT:  Dry membranes Cardiovascular:  Regular, tachycardic Lungs:  Bilateral diminished air movement Abdomen:  Soft, non-tender  Musculoskeletal:  Intact  Skin:  Dry   LABS: CBC  Recent Labs Lab 09/15/13 0754 09/16/13 0424  WBC 14.7* 11.6*  HGB 11.9* 11.6*  HCT 35.5* 33.4*  PLT 242 253   Coag's  Recent Labs Lab 09/15/13 1517  INR 1.39   BMET  Recent Labs Lab 09/15/13 1755 09/16/13 0037 09/16/13 0424  NA 142 145 146  K 5.9* 5.3 4.9  CL 113* 115* 116*  CO2 7* 10* 13*  BUN 130* 122* 118*  CREATININE 9.00* 7.78* 7.32*  GLUCOSE 159* 192* 159*   Electrolytes  Recent Labs Lab 09/15/13 1211 09/15/13 1342  09/15/13 1755 09/16/13 0037 09/16/13 0424  CALCIUM 6.2*   --   < > 6.7* 6.4* 6.6*  MG  --  2.5  --   --   --   --   PHOS  --  5.8*  --   --   --   --   < > = values in this interval not displayed. Sepsis Markers  Recent Labs Lab 09/15/13 0902  LATICACIDVEN 2.67*   ABG  Recent Labs Lab 09/15/13 1051  PHART 7.145*  PCO2ART 18.7*  PO2ART 83.0   Liver Enzymes  Recent Labs Lab 09/15/13 0754 09/16/13 0424  AST 402* 315*  ALT 534* 533*  ALKPHOS 328* 339*  BILITOT 0.6 0.7  ALBUMIN 2.2* 2.0*   Cardiac Enzymes  Recent Labs Lab 09/15/13 1211 09/15/13 1754 09/16/13 0037  TROPONINI <0.30 <0.30 <0.30   Glucose  Recent Labs Lab 09/15/13 0819 09/15/13 1648 09/15/13 2004 09/16/13 0028 09/16/13 0355 09/16/13 0741  GLUCAP 121* 125* 179* 174* 125* 156*   IMAGING:  Dg Chest 1 View  09/15/2013   CLINICAL DATA:  Hypotension  EXAM: CHEST - 1 VIEW  COMPARISON:  PA and lateral chest of August 03, 2005  FINDINGS: The lungs are mildly hypoinflated. There is no focal infiltrate. The heart and pulmonary vascularity are normal. The mediastinum is normal in width. There is mild stable tortuosity of the  descending thoracic aorta. There is no pleural effusion. The bony thorax is unremarkable.  IMPRESSION: There is no acute cardiopulmonary abnormality.   Electronically Signed   By: David  Martinique   On: 09/15/2013 08:09   US Renal  09/15/2013   CLINICAL DATA:  Acute renal failure  EXAM: RENAL/URINARY TRACT ULTRASOUND COMPLETE  COMPARISON:  CT 06/12/2005  FINDINGS: Right Kidney:  Length: 12.8 cm. Echogenicity within normal limits. No mass or hydronephrosis visualized.  Left Kidney:  Length: 12.1 cm. Normal renal cortical thickness and echogenicity. No hydronephrosis. There is a 2.9 cm cyst within the inferior pole of the left kidney and a 2.6 cm cyst within the interpolar region left kidney.  Bladder:  Decompressed with Foley catheter  IMPRESSION: No hydronephrosis.   Electronically Signed   By: Lovey Newcomer M.D.   On: 09/15/2013 12:42   Dg Chest Port 1  View  09/15/2013   CLINICAL DATA:  Central line insertion.  EXAM: PORTABLE CHEST - 1 VIEW  COMPARISON:  09/16/2003  FINDINGS: Interval placement of right central venous catheter with tip over the mid SVC region. No pneumothorax. Shallow inspiration. Hazy opacity in the left lung base suggesting infiltration or atelectasis. Right lung is grossly clear. Normal heart size and pulmonary vascularity.  IMPRESSION: Right central venous catheter tip over the mid SVC region. No pneumothorax. Hazy infiltration or atelectasis in the left lung base.   Electronically Signed   By: Lucienne Capers M.D.   On: 09/15/2013 21:53   ASSESSMENT / PLAN:  PULMONARY A:  No active issues P:   Goal SpO2>92 Supplemental oxygen PRN  CARDIOVASCULAR A:   Hypotensive on presentation, resolved, not on vasopressor R IJ CVL placed 9/4 P:  Goal MAP>65  RENAL A:   Hypovolemia Acute renal failure Metabolic acidosis P:   Renal following Trend BMP Bicarbonate gtt@125   GASTROINTESTINAL A:   Dysphagia in setting of acute encephalopathy, resolved Shocked liver Colon CA, weight loss > 30 lbs one year  GI Px is not reaired P:   Clear liquids Advance as tolerated Trend LFT Defer further GI workup for now  HEMATOLOGIC A:  Anemia VTE Ox P:  Trend CBC Heparin [place]  INFECTIOUS A:   Recent cellulitis  P:   Follow blood / urine cx 9/4 >>> Zosyn / Vancomycin 9/4 >>>  ENDOCRINE A:   Diabetes  Hyperglycemia P:   SSI  NEUROLOGIC A:    Acute encephalopathy in setting of acute uremia and metabolic acidosis  P:   Avoid sedatives / hypnotics  Transfer to SDU under PCCM.  I have personally obtained history, examined patient, evaluated and interpreted laboratory and imaging results, reviewed medical records, formulated assessment / plan and placed orders.  Doree Fudge, MD Pulmonary and [place] Pager: [phone]  09/16/2013, 10:55 AM

## 2013-09-16 NOTE — Progress Notes (Signed)
Critical value was called by lab. C02-10 calcium-6.4. Dr. Titus Mould made aware with orders made.

## 2013-09-16 NOTE — Progress Notes (Signed)
Nutrition Brief Note  Patient identified on the Malnutrition Screening Tool (MST) Report  Pt presented to ED with altered mental status, hypotension, hyperkalemia 9/4.   Wt Readings from Last 15 Encounters:  09/15/13 195 lb 15.8 oz (88.9 kg)  08/15/13 210 lb (95.255 kg)    Body mass index is 26.58 kg/(m^2). Patient meets criteria for overweight based on current BMI. Pt reports weight loss of 15-20# related to his "fluid pill" (HCTZ). Swelling erythema to right lower extremity noted 7/26 with 2+ pitting edema to RLE.  Good appetite at baseline per pt. Diet is being advanced. He is c/o being hungry and says he's been waiting for an hour for his tray. Current diet order is clear liquids patient is consuming approximately n/a% of meals at this time. Labs and medications reviewed.   No nutrition interventions warranted at this time. If nutrition issues arise, please consult RD.   Colman Cater MS,RD,CSG,LDN Office: [phone] Pager: [phone]

## 2013-09-16 NOTE — Progress Notes (Signed)
[place] Progress Note Patient Name: William Stark DOB: 24-Jun-1945 MRN: [id number]   Date of Service  09/16/2013  HPI/Events of Note  cvp 4-5 but MAp wnl Low ca Bicarb better  eICU Interventions  contnued bicarb drip for nonag portion Ca syupp, but get alb in am  No role bolus unless map drops     Intervention Category Major Interventions: Acid-Base disturbance - evaluation and management  [name] J. 09/16/2013, 1:35 AM

## 2013-09-17 DIAGNOSIS — E119 Type 2 diabetes mellitus without complications: Secondary | ICD-10-CM

## 2013-09-17 DIAGNOSIS — L02619 Cutaneous abscess of unspecified foot: Secondary | ICD-10-CM

## 2013-09-17 DIAGNOSIS — R748 Abnormal levels of other serum enzymes: Secondary | ICD-10-CM

## 2013-09-17 DIAGNOSIS — L03119 Cellulitis of unspecified part of limb: Secondary | ICD-10-CM

## 2013-09-17 LAB — COMPREHENSIVE METABOLIC PANEL
ALK PHOS: 356 U/L — AB (ref 39–117)
ALT: 586 U/L — ABNORMAL HIGH (ref 0–53)
ANION GAP: 15 (ref 5–15)
AST: 324 U/L — ABNORMAL HIGH (ref 0–37)
Albumin: 2 g/dL — ABNORMAL LOW (ref 3.5–5.2)
BILIRUBIN TOTAL: 1.2 mg/dL (ref 0.3–1.2)
BUN: 77 mg/dL — AB (ref 6–23)
CHLORIDE: 114 meq/L — AB (ref 96–112)
CO2: 20 meq/L (ref 19–32)
Calcium: 6.6 mg/dL — ABNORMAL LOW (ref 8.4–10.5)
Creatinine, Ser: 3.64 mg/dL — ABNORMAL HIGH (ref 0.50–1.35)
GFR calc Af Amer: 18 mL/min — ABNORMAL LOW (ref >90)
GFR, EST NON AFRICAN AMERICAN: 16 mL/min — AB (ref >90)
Glucose, Bld: 194 mg/dL — ABNORMAL HIGH (ref 70–99)
Potassium: 3.7 mEq/L (ref 3.7–5.3)
Sodium: 149 mEq/L — ABNORMAL HIGH (ref 137–147)
Total Protein: 4.3 g/dL — ABNORMAL LOW (ref 6.0–8.3)

## 2013-09-17 LAB — CBC
HEMATOCRIT: 32.8 % — AB (ref 39.0–52.0)
HEMOGLOBIN: 11.6 g/dL — AB (ref 13.0–17.0)
MCH: 30 pg (ref 26.0–34.0)
MCHC: 35.4 g/dL (ref 30.0–36.0)
MCV: 84.8 fL (ref 78.0–100.0)
Platelets: 224 10*3/uL (ref 150–400)
RBC: 3.87 MIL/uL — AB (ref 4.22–5.81)
RDW: 14.8 % (ref 11.5–15.5)
WBC: 11.8 10*3/uL — AB (ref 4.0–10.5)

## 2013-09-17 LAB — GLUCOSE, CAPILLARY
GLUCOSE-CAPILLARY: 238 mg/dL — AB (ref 70–99)
Glucose-Capillary: 250 mg/dL — ABNORMAL HIGH (ref 70–99)
Glucose-Capillary: 137 mg/dL — ABNORMAL HIGH (ref 70–99)
Glucose-Capillary: 168 mg/dL — ABNORMAL HIGH (ref 70–99)

## 2013-09-17 LAB — VANCOMYCIN, RANDOM: VANCOMYCIN RM: 9.3 ug/mL

## 2013-09-17 MED ORDER — VANCOMYCIN HCL IN DEXTROSE 750-5 MG/150ML-% IV SOLN
750.0000 mg | INTRAVENOUS | Status: DC
  Administered 2013-09-17 – 2013-09-18 (×2): 750 mg via INTRAVENOUS
  Filled 2013-09-17 (×2): qty 150

## 2013-09-17 MED ORDER — PIPERACILLIN-TAZOBACTAM 3.375 G IVPB
3.3750 g | Freq: Three times a day (TID) | INTRAVENOUS | Status: DC
  Administered 2013-09-17 – 2013-09-20 (×9): 3.375 g via INTRAVENOUS
  Filled 2013-09-17 (×12): qty 50

## 2013-09-17 NOTE — Progress Notes (Signed)
ANTIBIOTIC CONSULT NOTE  Pharmacy Consult for vancomycin and zosyn Indication: sepsis/cellulititis  No Known Allergies  Patient Measurements: Height: 6' (182.9 cm) Weight: 195 lb 15.8 oz (88.9 kg) IBW/kg (Calculated) : 77.6  Body Weight: 95.3 kg  Vital Signs: Temp: 98.9 F (37.2 C) (09/06 0756) Temp src: Oral (09/06 0756) BP: 124/69 mmHg (09/06 0756) Pulse Rate: 113 (09/06 0756) Intake/Output from previous day: 09/05 0701 - 09/06 0700 In: 4160 [P.O.:960; I.V.:3000; IV Piggyback:200] Out: 3300 [Urine:3300] Intake/Output from this shift: Total I/O In: 250 [I.V.:250] Out: 200 [Urine:200]  Labs:  Recent Labs  09/15/13 0754 09/15/13 0819  09/16/13 0424 09/16/13 1100 09/17/13 0345  WBC 14.7*  --   --  11.6*  --  11.8*  HGB 11.9*  --   --  11.6*  --  11.6*  PLT 242  --   --  253  --  224  LABCREA  --  254.38  --   --   --   --   CREATININE 10.72*  --   < > 7.32* 6.16* 3.64*  < > = values in this interval not displayed. Estimated Creatinine Clearance: 21.3 ml/min (by C-G formula based on Cr of 3.64).  Recent Labs  09/17/13 0345  VANCORANDOM 9.3     Microbiology: Recent Results (from the past 720 hour(s))  CULTURE, BLOOD (ROUTINE X 2)     Status: None   Collection Time    09/15/13 10:15 AM      Result Value Ref Range Status   Specimen Description BLOOD LEFT HAND   Final   Special Requests BOTTLES DRAWN AEROBIC AND ANAEROBIC 10CC   Final   Culture  Setup Time     Final   Value: 09/15/2013 14:27     Performed at [facility]   Culture     Final   Value:        BLOOD CULTURE RECEIVED NO GROWTH TO DATE CULTURE WILL BE HELD FOR 5 DAYS BEFORE ISSUING A FINAL NEGATIVE REPORT     Performed at [facility]   Report Status PENDING   Incomplete  CULTURE, BLOOD (ROUTINE X 2)     Status: None   Collection Time    09/15/13 10:30 AM      Result Value Ref Range Status   Specimen Description BLOOD LEFT HAND   Final   Special Requests BOTTLES DRAWN AEROBIC  AND ANAEROBIC 10CC   Final   Culture  Setup Time     Final   Value: 09/15/2013 14:28     Performed at [facility]   Culture     Final   Value:        BLOOD CULTURE RECEIVED NO GROWTH TO DATE CULTURE WILL BE HELD FOR 5 DAYS BEFORE ISSUING A FINAL NEGATIVE REPORT     Performed at [facility]   Report Status PENDING   Incomplete  MRSA PCR SCREENING     Status: None   Collection Time    09/15/13  4:45 PM      Result Value Ref Range Status   MRSA by PCR NEGATIVE  NEGATIVE Final   Comment:            The GeneXpert MRSA Assay (FDA     approved for NASAL specimens     only), is one component of a     comprehensive MRSA colonization     surveillance program. It is not     intended to diagnose MRSA  infection nor to guide or     monitor treatment for     MRSA infections.    Medical History: Past Medical History  Diagnosis Date  . Hypertension   . Diabetes mellitus without complication   . Cancer     Medications:  See medication history  Assessment: 68 yo man continues on broad spectrum antibiotics for sepsis and recent cellulitis.  He is recovering from ARF and scr down this am to 3.6. He received 1500 mg vancomycin on 9/4, random level this am was just below goal at 9.3 roughly 42 hours since last dose, expect clearance is improving with drop in scr will redose vancomycin today but will need to follow closely and recheck random levels if renal function does not continue to improve. Will also transition to extended interval zosyn.   No fevers noted overnight, wbc stable at 11.   9/4 bld x2 - ngtd Goal of Therapy:  Vancomycin trough level 15-20 mcg/ml  Plan:  Cont zosyn 3.375g gm IV q8 hours Resume vancomycin 750mg  q24 hours F/u cultures and clinical course  Thanks for allowing pharmacy to be a part of this patient's care.  Erin Hearing PharmD., BCPS Clinical Pharmacist Pager [phone] 09/17/2013 9:28 AM

## 2013-09-17 NOTE — Progress Notes (Signed)
PULMONARY / CRITICAL CARE MEDICINE  Name: William Stark MRN: [id number] DOB: [birth year]    ADMISSION DATE:  09/15/2013 CONSULTATION DATE:  09/15/2013  REFERRING MD :  Alice Rieger   CHIEF COMPLAINT:  Hypotension and renal failure   INITIAL PRESENTATION: 68 yo presented 9/4 with altered mental status, hypovolemia and acute renal failure.  STUDIES / EVENTS:  9/4  Renal US >>> no hydronephrosis  INTERVAL HISTORY:  Patient is awake this am and conversing.  BP adequate, sats great on RA. Still has sinus tach at 120  VITAL SIGNS: Temp:  [98.3 F (36.8 C)-99.9 F (37.7 C)] 99.3 F (37.4 C) (09/06 1104) Pulse Rate:  [109-128] 120 (09/06 1104) Resp:  [18-34] 21 (09/06 1104) BP: (92-124)/(59-72) 122/72 mmHg (09/06 1104) SpO2:  [99 %-100 %] 100 % (09/06 1104)  HEMODYNAMICS:    VENTILATOR SETTINGS:   INTAKE / OUTPUT: Intake/Output     09/05 0701 - 09/06 0700 09/06 0701 - 09/07 0700   P.O. 960 960   I.V. (mL/kg) 3000 (33.7) 625 (7)   IV Piggyback 200 200   Total Intake(mL/kg) 4160 (46.8) 1785 (20.1)   Urine (mL/kg/hr) 3300 (1.5) 400 (0.8)   Total Output 3300 400   Net +860 +1385        Stool Occurrence 1 x     PHYSICAL EXAMINATION: General:  Well developed male in nad Neuro:  Awake and more appropriate, moves all 4. HEENT:  Nose without purulence or d/c, neck without LN or TMG Cardiovascular:  Regular, tachycardic to 120 Lungs:  Totally clear to auscultation today, no wheezing. Abdomen:  Soft, non-tender  LE with mild edema, no cyanosis or definite warmth.  LABS: CBC  Recent Labs Lab 09/15/13 0754 09/16/13 0424 09/17/13 0345  WBC 14.7* 11.6* 11.8*  HGB 11.9* 11.6* 11.6*  HCT 35.5* 33.4* 32.8*  PLT 242 253 224   Coag's  Recent Labs Lab 09/15/13 1517  INR 1.39   BMET  Recent Labs Lab 09/16/13 0424 09/16/13 1100 09/17/13 0345  NA 146 149* 149*  K 4.9 4.4 3.7  CL 116* 118* 114*  CO2 13* 16* 20  BUN 118* 107* 77*  CREATININE 7.32* 6.16* 3.64*   GLUCOSE 159* 201* 194*   Electrolytes  Recent Labs Lab 09/15/13 1211 09/15/13 1342  09/16/13 0424 09/16/13 1100 09/17/13 0345  CALCIUM 6.2*  --   < > 6.6* 6.7* 6.6*  MG  --  2.5  --   --   --   --   PHOS  --  5.8*  --   --   --   --   < > = values in this interval not displayed. Sepsis Markers  Recent Labs Lab 09/15/13 0902  LATICACIDVEN 2.67*   ABG  Recent Labs Lab 09/15/13 1051  PHART 7.145*  PCO2ART 18.7*  PO2ART 83.0   Liver Enzymes  Recent Labs Lab 09/15/13 0754 09/16/13 0424 09/17/13 0345  AST 402* 315* 324*  ALT 534* 533* 586*  ALKPHOS 328* 339* 356*  BILITOT 0.6 0.7 1.2  ALBUMIN 2.2* 2.0* 2.0*   Cardiac Enzymes  Recent Labs Lab 09/15/13 1211 09/15/13 1754 09/16/13 0037  TROPONINI <0.30 <0.30 <0.30   Glucose  Recent Labs Lab 09/16/13 0355 09/16/13 0741 09/16/13 1135 09/16/13 1538 09/16/13 2107 09/17/13 0821  GLUCAP 125* 156* 198* 342* 200* 250*   IMAGING:  Dg Chest Port 1 View  09/15/2013   CLINICAL DATA:  Central line insertion.  EXAM: PORTABLE CHEST - 1 VIEW  COMPARISON:  09/16/2003  FINDINGS: Interval placement of right central venous catheter with tip over the mid SVC region. No pneumothorax. Shallow inspiration. Hazy opacity in the left lung base suggesting infiltration or atelectasis. Right lung is grossly clear. Normal heart size and pulmonary vascularity.  IMPRESSION: Right central venous catheter tip over the mid SVC region. No pneumothorax. Hazy infiltration or atelectasis in the left lung base.   Electronically Signed   By: Lucienne Capers M.D.   On: 09/15/2013 21:53   ASSESSMENT / PLAN:  1) cellulitis with presumed sepsis Zosyn/vanco 9/4 >> BC pending  The pt is much improved, with stable bp and improved mentation.  No persistent warmth or weeping of LE today  -continue abx for now, and f/u cultures.  2) acute renal failure secondary to ?sepsis, hypovolemia, meds His renal function is improving, and is making urine.   His still on bicarb drip for metabolic acidosis, and will continue this for one more day before d/c  -probably d/c bicarb drip in am -f/u labs -monitor I/O  3) increased LFT's presumed secondary to shock liver His lab values have improved, but now have reached plateau.  May need further w/u for other sources if they do not normalize  -recheck LFT's in am -further evaluation if not normalizing (ie hep panel, etc)  4) Hyperglycemia Blood sugars ok on SSI.  Will continue.

## 2013-09-17 NOTE — Progress Notes (Signed)
Utilization review completed.  

## 2013-09-18 LAB — BASIC METABOLIC PANEL
ANION GAP: 11 (ref 5–15)
BUN: 45 mg/dL — AB (ref 6–23)
CHLORIDE: 112 meq/L (ref 96–112)
CO2: 24 mEq/L (ref 19–32)
Calcium: 7 mg/dL — ABNORMAL LOW (ref 8.4–10.5)
Creatinine, Ser: 2.2 mg/dL — ABNORMAL HIGH (ref 0.50–1.35)
GFR calc Af Amer: 34 mL/min — ABNORMAL LOW (ref >90)
GFR calc non Af Amer: 29 mL/min — ABNORMAL LOW (ref >90)
Glucose, Bld: 172 mg/dL — ABNORMAL HIGH (ref 70–99)
POTASSIUM: 3.7 meq/L (ref 3.7–5.3)
Sodium: 147 mEq/L (ref 137–147)

## 2013-09-18 LAB — HEPATIC FUNCTION PANEL
ALBUMIN: 2 g/dL — AB (ref 3.5–5.2)
ALT: 455 U/L — ABNORMAL HIGH (ref 0–53)
AST: 138 U/L — AB (ref 0–37)
Alkaline Phosphatase: 385 U/L — ABNORMAL HIGH (ref 39–117)
BILIRUBIN DIRECT: 1.1 mg/dL — AB (ref 0.0–0.3)
Indirect Bilirubin: 0.6 mg/dL (ref 0.3–0.9)
Total Bilirubin: 1.7 mg/dL — ABNORMAL HIGH (ref 0.3–1.2)
Total Protein: 4.4 g/dL — ABNORMAL LOW (ref 6.0–8.3)

## 2013-09-18 LAB — GLUCOSE, CAPILLARY
GLUCOSE-CAPILLARY: 208 mg/dL — AB (ref 70–99)
Glucose-Capillary: 209 mg/dL — ABNORMAL HIGH (ref 70–99)
Glucose-Capillary: 153 mg/dL — ABNORMAL HIGH (ref 70–99)
Glucose-Capillary: 124 mg/dL — ABNORMAL HIGH (ref 70–99)

## 2013-09-18 MED ORDER — VANCOMYCIN HCL IN DEXTROSE 1-5 GM/200ML-% IV SOLN
1000.0000 mg | INTRAVENOUS | Status: DC
  Administered 2013-09-19: 1000 mg via INTRAVENOUS
  Filled 2013-09-18: qty 200

## 2013-09-18 MED ORDER — ACETAMINOPHEN 325 MG PO TABS
650.0000 mg | ORAL_TABLET | Freq: Once | ORAL | Status: AC
  Administered 2013-09-18: 650 mg via ORAL
  Filled 2013-09-18: qty 2

## 2013-09-18 NOTE — Progress Notes (Signed)
09/18/13 2135  Vitals  Temp ! 103.1 F (39.5 C)  Temp src Oral  Pulse Rate ! 126  ECG Heart Rate ! 127  Resp ! 51  Dr Lamonte Sakai notified and aware of all labs, new orders received

## 2013-09-18 NOTE — Progress Notes (Signed)
PULMONARY / CRITICAL CARE MEDICINE  Name: William Stark MRN: [id number] DOB: 1945-03-05    ADMISSION DATE:  09/15/2013 CONSULTATION DATE:  09/15/2013  REFERRING MD :  Alice Rieger   CHIEF COMPLAINT:  Hypotension and renal failure   INITIAL PRESENTATION: 68 yo presented 9/4 with altered mental status, hypovolemia and acute renal failure.  STUDIES / EVENTS:  9/4  Renal US >>> no hydronephrosis  INTERVAL HISTORY:  Pt stable overnight, still tachycardic.  Low grade temp this am, but has great urine output.  VITAL SIGNS: Temp:  [99.8 F (37.7 C)-100.6 F (38.1 C)] 100.6 F (38.1 C) (09/07 1118) Pulse Rate:  [110-120] 120 (09/07 0753) Resp:  [20-32] 20 (09/07 1118) BP: (117-140)/(60-95) 140/81 mmHg (09/07 1118) SpO2:  [100 %] 100 % (09/07 1118)  HEMODYNAMICS:    VENTILATOR SETTINGS:   INTAKE / OUTPUT: Intake/Output     09/06 0701 - 09/07 0700 09/07 0701 - 09/08 0700   P.O. 1560    I.V. (mL/kg) 1371.3 (15.4)    IV Piggyback 200    Total Intake(mL/kg) 3131.3 (35.2)    Urine (mL/kg/hr) 2100 (1) 300 (0.6)   Stool  1 (0)   Total Output 2100 301   Net +1031.3 -301        Stool Occurrence 1 x     PHYSICAL EXAMINATION: General:  Well developed male in nad Neuro:  Awake and more appropriate, moves all 4. HEENT:  Nose without purulence or d/c, neck without LN or TMG Cardiovascular:  Regular, tachycardic to 120's Lungs:  Totally clear to auscultation today, no wheezing. Abdomen:  Soft, non-tender  LE with mild edema, no cyanosis, minimal warmth of LE  LABS: CBC  Recent Labs Lab 09/15/13 0754 09/16/13 0424 09/17/13 0345  WBC 14.7* 11.6* 11.8*  HGB 11.9* 11.6* 11.6*  HCT 35.5* 33.4* 32.8*  PLT 242 253 224   Coag's  Recent Labs Lab 09/15/13 1517  INR 1.39   BMET  Recent Labs Lab 09/16/13 1100 09/17/13 0345 09/18/13 0345  NA 149* 149* 147  K 4.4 3.7 3.7  CL 118* 114* 112  CO2 16* 20 24  BUN 107* 77* 45*  CREATININE 6.16* 3.64* 2.20*  GLUCOSE 201* 194*  172*   Electrolytes  Recent Labs Lab 09/15/13 1211 09/15/13 1342  09/16/13 1100 09/17/13 0345 09/18/13 0345  CALCIUM 6.2*  --   < > 6.7* 6.6* 7.0*  MG  --  2.5  --   --   --   --   PHOS  --  5.8*  --   --   --   --   < > = values in this interval not displayed. Sepsis Markers  Recent Labs Lab 09/15/13 0902  LATICACIDVEN 2.67*   ABG  Recent Labs Lab 09/15/13 1051  PHART 7.145*  PCO2ART 18.7*  PO2ART 83.0   Liver Enzymes  Recent Labs Lab 09/16/13 0424 09/17/13 0345 09/18/13 0345  AST 315* 324* 138*  ALT 533* 586* 455*  ALKPHOS 339* 356* 385*  BILITOT 0.7 1.2 1.7*  ALBUMIN 2.0* 2.0* 2.0*   Cardiac Enzymes  Recent Labs Lab 09/15/13 1211 09/15/13 1754 09/16/13 0037  TROPONINI <0.30 <0.30 <0.30   Glucose  Recent Labs Lab 09/16/13 2107 09/17/13 0821 09/17/13 1238 09/17/13 1600 09/17/13 2130 09/18/13 0753  GLUCAP 200* 250* 238* 137* 168* 208*   IMAGING:  No results found. ASSESSMENT / PLAN:  1) cellulitis with presumed sepsis Zosyn/vanco 9/4 >> BC pending  The pt is remaining stable, but did have  low grade temp this am.  Is already on broad spectrum abx, and clinically is much improved.  Will watch another day on current therapy.  -continue same abx for now, and f/u blood cultures that have not been finalized.  2) acute renal failure secondary to ?sepsis, hypovolemia, meds His renal function is improving, and is making urine. Will d/c bicarb drip  - d/c bicarb drip in am -f/u labs   3) increased LFT's presumed secondary to shock liver His lab values have improved, but may need further w/u for other sources if they do not normalize  -recheck LFT's in am -further evaluation if not normalizing (ie hep panel, etc)  4) Hyperglycemia Blood sugars ok on SSI.  Will continue.

## 2013-09-19 ENCOUNTER — Inpatient Hospital Stay (HOSPITAL_COMMUNITY): Payer: Medicare Other

## 2013-09-19 DIAGNOSIS — A419 Sepsis, unspecified organism: Principal | ICD-10-CM

## 2013-09-19 DIAGNOSIS — R6520 Severe sepsis without septic shock: Secondary | ICD-10-CM

## 2013-09-19 LAB — BLOOD GAS, ARTERIAL
Acid-Base Excess: 1 mmol/L (ref 0.0–2.0)
Bicarbonate: 24.6 mEq/L — ABNORMAL HIGH (ref 20.0–24.0)
DRAWN BY: 36989
O2 CONTENT: 1 L/min
O2 Saturation: 93 %
PATIENT TEMPERATURE: 98.6
TCO2: 25.7 mmol/L (ref 0–100)
pCO2 arterial: 36.1 mmHg (ref 35.0–45.0)
pH, Arterial: 7.448 (ref 7.350–7.450)
pO2, Arterial: 71.4 mmHg — ABNORMAL LOW (ref 80.0–100.0)

## 2013-09-19 LAB — GLUCOSE, CAPILLARY
GLUCOSE-CAPILLARY: 112 mg/dL — AB (ref 70–99)
GLUCOSE-CAPILLARY: 166 mg/dL — AB (ref 70–99)
Glucose-Capillary: 124 mg/dL — ABNORMAL HIGH (ref 70–99)
Glucose-Capillary: 90 mg/dL (ref 70–99)
Glucose-Capillary: 88 mg/dL (ref 70–99)
Glucose-Capillary: 96 mg/dL (ref 70–99)

## 2013-09-19 LAB — BASIC METABOLIC PANEL
ANION GAP: 13 (ref 5–15)
Anion gap: 14 (ref 5–15)
BUN: 29 mg/dL — AB (ref 6–23)
BUN: 30 mg/dL — ABNORMAL HIGH (ref 6–23)
CHLORIDE: 112 meq/L (ref 96–112)
CHLORIDE: 113 meq/L — AB (ref 96–112)
CO2: 24 meq/L (ref 19–32)
CO2: 24 meq/L (ref 19–32)
CREATININE: 1.79 mg/dL — AB (ref 0.50–1.35)
Calcium: 7.2 mg/dL — ABNORMAL LOW (ref 8.4–10.5)
Calcium: 6.9 mg/dL — ABNORMAL LOW (ref 8.4–10.5)
Creatinine, Ser: 1.83 mg/dL — ABNORMAL HIGH (ref 0.50–1.35)
GFR calc Af Amer: 43 mL/min — ABNORMAL LOW (ref >90)
GFR calc non Af Amer: 37 mL/min — ABNORMAL LOW (ref >90)
GFR calc non Af Amer: 36 mL/min — ABNORMAL LOW (ref >90)
GFR, EST AFRICAN AMERICAN: 42 mL/min — AB (ref >90)
GLUCOSE: 118 mg/dL — AB (ref 70–99)
Glucose, Bld: 93 mg/dL (ref 70–99)
Potassium: 3.7 mEq/L (ref 3.7–5.3)
Potassium: 3.4 mEq/L — ABNORMAL LOW (ref 3.7–5.3)
Sodium: 150 mEq/L — ABNORMAL HIGH (ref 137–147)
Sodium: 150 mEq/L — ABNORMAL HIGH (ref 137–147)

## 2013-09-19 LAB — PROTIME-INR
INR: 1.39 (ref 0.00–1.49)
INR: 1.39 (ref 0.00–1.49)
PROTHROMBIN TIME: 17.1 s — AB (ref 11.6–15.2)
PROTHROMBIN TIME: 17.1 s — AB (ref 11.6–15.2)

## 2013-09-19 LAB — AMMONIA: AMMONIA: 53 umol/L (ref 11–60)

## 2013-09-19 LAB — CBC
HCT: 32.4 % — ABNORMAL LOW (ref 39.0–52.0)
HEMATOCRIT: 35.2 % — AB (ref 39.0–52.0)
HEMOGLOBIN: 11.7 g/dL — AB (ref 13.0–17.0)
Hemoglobin: 10.9 g/dL — ABNORMAL LOW (ref 13.0–17.0)
MCH: 29.5 pg (ref 26.0–34.0)
MCH: 29.5 pg (ref 26.0–34.0)
MCHC: 33.2 g/dL (ref 30.0–36.0)
MCHC: 33.6 g/dL (ref 30.0–36.0)
MCV: 88.9 fL (ref 78.0–100.0)
MCV: 87.6 fL (ref 78.0–100.0)
PLATELETS: 154 10*3/uL (ref 150–400)
Platelets: 179 10*3/uL (ref 150–400)
RBC: 3.96 MIL/uL — ABNORMAL LOW (ref 4.22–5.81)
RBC: 3.7 MIL/uL — ABNORMAL LOW (ref 4.22–5.81)
RDW: 14.9 % (ref 11.5–15.5)
RDW: 14.8 % (ref 11.5–15.5)
WBC: 17.4 10*3/uL — ABNORMAL HIGH (ref 4.0–10.5)
WBC: 18.6 10*3/uL — ABNORMAL HIGH (ref 4.0–10.5)

## 2013-09-19 LAB — HEPATITIS PANEL, ACUTE
HCV AB: NEGATIVE
Hep A IgM: NONREACTIVE
Hep B C IgM: NONREACTIVE
Hepatitis B Surface Ag: NEGATIVE

## 2013-09-19 LAB — HEPATIC FUNCTION PANEL
ALT: 258 U/L — ABNORMAL HIGH (ref 0–53)
AST: 136 U/L — AB (ref 0–37)
Albumin: 2 g/dL — ABNORMAL LOW (ref 3.5–5.2)
Alkaline Phosphatase: 459 U/L — ABNORMAL HIGH (ref 39–117)
BILIRUBIN TOTAL: 1.6 mg/dL — AB (ref 0.3–1.2)
Bilirubin, Direct: 1 mg/dL — ABNORMAL HIGH (ref 0.0–0.3)
Indirect Bilirubin: 0.6 mg/dL (ref 0.3–0.9)
Total Protein: 4.2 g/dL — ABNORMAL LOW (ref 6.0–8.3)

## 2013-09-19 LAB — HIV ANTIBODY (ROUTINE TESTING W REFLEX): HIV 1&2 Ab, 4th Generation: NONREACTIVE

## 2013-09-19 LAB — LIPASE, BLOOD: Lipase: 202 U/L — ABNORMAL HIGH (ref 11–59)

## 2013-09-19 LAB — TSH: TSH: 1.58 u[IU]/mL (ref 0.350–4.500)

## 2013-09-19 LAB — LACTIC ACID, PLASMA: Lactic Acid, Venous: 2.4 mmol/L — ABNORMAL HIGH (ref 0.5–2.2)

## 2013-09-19 LAB — CLOSTRIDIUM DIFFICILE BY PCR: Toxigenic C. Difficile by PCR: NEGATIVE

## 2013-09-19 LAB — TROPONIN I

## 2013-09-19 MED ORDER — ACETAMINOPHEN 10 MG/ML IV SOLN
1000.0000 mg | Freq: Four times a day (QID) | INTRAVENOUS | Status: DC
  Filled 2013-09-19 (×2): qty 100

## 2013-09-19 MED ORDER — LORAZEPAM 2 MG/ML IJ SOLN
1.0000 mg | INTRAMUSCULAR | Status: DC | PRN
  Administered 2013-09-19: 2 mg via INTRAVENOUS
  Filled 2013-09-19 (×2): qty 1

## 2013-09-19 MED ORDER — VANCOMYCIN HCL IN DEXTROSE 750-5 MG/150ML-% IV SOLN
750.0000 mg | Freq: Two times a day (BID) | INTRAVENOUS | Status: DC
  Filled 2013-09-19: qty 150

## 2013-09-19 MED ORDER — DEXMEDETOMIDINE HCL IN NACL 200 MCG/50ML IV SOLN
0.4000 ug/kg/h | INTRAVENOUS | Status: DC
  Administered 2013-09-19 (×2): 0.4 ug/kg/h via INTRAVENOUS
  Filled 2013-09-19 (×2): qty 50

## 2013-09-19 MED ORDER — LORAZEPAM 0.5 MG PO TABS
0.5000 mg | ORAL_TABLET | Freq: Two times a day (BID) | ORAL | Status: DC
  Administered 2013-09-19: 0.5 mg via ORAL
  Filled 2013-09-19: qty 1

## 2013-09-19 NOTE — Progress Notes (Signed)
PULMONARY / CRITICAL CARE MEDICINE  Name: William Stark MRN: [id number] DOB: 17-Nov-1945    ADMISSION DATE:  09/15/2013 CONSULTATION DATE:  09/15/2013  REFERRING MD :  Alice Rieger   CHIEF COMPLAINT:  Hypotension and renal failure   INITIAL PRESENTATION: 69 y/o presented 9/4 with altered mental status, hypovolemia and acute renal failure.  STUDIES / EVENTS:  9/4  Renal US >>> no hydronephrosis  INTERVAL HISTORY:  RN reports concerns for seizure activity, pt awake, alert during events - facial twitching, L arm and ? LLE noted   VITAL SIGNS: Temp:  [100.6 F (38.1 C)-103.2 F (39.6 C)] 103.2 F (39.6 C) (09/08 0754) Pulse Rate:  [118-127] 127 (09/08 0754) Resp:  [15-42] 29 (09/08 0754) BP: (115-140)/(58-81) 139/70 mmHg (09/08 0754) SpO2:  [98 %-100 %] 98 % (09/08 0754)  INTAKE / OUTPUT: Intake/Output     09/07 0701 - 09/08 0700 09/08 0701 - 09/09 0700   P.O. 240    I.V. (mL/kg)     IV Piggyback     Total Intake(mL/kg) 240 (2.7)    Urine (mL/kg/hr) 1450 (0.7)    Stool 1 (0)    Total Output 1451     Net -1211          Urine Occurrence 2 x 1 x   Stool Occurrence 2 x 1 x    PHYSICAL EXAMINATION: General:  Well developed male in nad Neuro:  AAO,  moves all 4, intermittent short bursts of rhythmic twitching of L face, L arm / leg noted. HEENT:  Nose without purulence or d/c Cardiovascular:  s1s2 rrr, no m/r/g, tachy Lungs: resp's even/non-labored, lungs bilaterally clear anterior, posterior lower with faint crackles Abdomen:  Soft, non-tender  Skin:  Warm to touch, generalized erythema, LE with mild edema  LABS: CBC  Recent Labs Lab 09/16/13 0424 09/17/13 0345 09/19/13 0649  WBC 11.6* 11.8* 17.4*  HGB 11.6* 11.6* 11.7*  HCT 33.4* 32.8* 35.2*  PLT 253 224 179   Coag's  Recent Labs Lab 09/15/13 1517  INR 1.39   BMET  Recent Labs Lab 09/17/13 0345 09/18/13 0345 09/19/13 0649  NA 149* 147 150*  K 3.7 3.7 3.7  CL 114* 112 112  CO2 20 24 24   BUN 77* 45*  29*  CREATININE 3.64* 2.20* 1.79*  GLUCOSE 194* 172* 118*   Electrolytes  Recent Labs Lab 09/15/13 1211 09/15/13 1342  09/17/13 0345 09/18/13 0345 09/19/13 0649  CALCIUM 6.2*  --   < > 6.6* 7.0* 7.2*  MG  --  2.5  --   --   --   --   PHOS  --  5.8*  --   --   --   --   < > = values in this interval not displayed. Sepsis Markers  Recent Labs Lab 09/15/13 0902  LATICACIDVEN 2.67*   ABG  Recent Labs Lab 09/15/13 1051  PHART 7.145*  PCO2ART 18.7*  PO2ART 83.0   Liver Enzymes  Recent Labs Lab 09/16/13 0424 09/17/13 0345 09/18/13 0345  AST 315* 324* 138*  ALT 533* 586* 455*  ALKPHOS 339* 356* 385*  BILITOT 0.7 1.2 1.7*  ALBUMIN 2.0* 2.0* 2.0*   Cardiac Enzymes  Recent Labs Lab 09/15/13 1211 09/15/13 1754 09/16/13 0037  TROPONINI <0.30 <0.30 <0.30   Glucose  Recent Labs Lab 09/17/13 2130 09/18/13 0753 09/18/13 1116 09/18/13 1553 09/18/13 2152 09/19/13 0756  GLUCAP 168* 208* 209* 153* 124* 124*   IMAGING:  No results found.  ASSESSMENT / PLAN:  Cellulitis (? LE's) with presumed sepsis Fever - unclear etiology, ? Sepsis vs meds SIRS  Erythema - generalized, noted 9/8.  ? Fever related vs vanco  Zosyn/vanco 9/4 >> BC x2 9/8 >> C-Diff 9/8 >>  Plan: Continue abx for now F/u cultures. Assess C-Diff Follow fever trend S/P tylenol x 1 overnight 9/8 without response, hold further dosing Assess CXR in am   Focal Seizure - new facial twitching noted 9/8, ? Fever related vs benzo withdrawal.  No impairment of consciousness.   Plan: NPO Assess HIV, TSH CT Head EEG Ativan 0.5 mg BID + PRN seizure Monitor mental status closely, seizure precautions  Acute renal failure secondary to ?sepsis, hypovolemia, meds, improved.   Plan: -trend sr cr -monitor I/O  Elevated LFT's - presumed secondary to shock liver  Plan: -recheck LFT's in am -assess hepatitis panel   Hyperglycemia  Plan: SSI    Noe Gens, NP-C [facility] Pgr: [phone] or [phone]  Attending:  I have seen and examined the patient with nurse practitioner/resident and agree with the note above.   Not sure why encephalopathy and fever persist but I don't see much pneumonia, so he needs an LP given confusion, fevers.  Check EET CT head LP > fluoro guided Check ammonia  Roselie Awkward, MD [place] [facility] Pager: [phone] Cell: [phone] If no response, call [phone]

## 2013-09-19 NOTE — Progress Notes (Addendum)
Asked by eMD to re round on patient  Patient moved to ICU due to delirium and respiratory disttress. Since arrival to ICU he has been started on precedex and currently RASS -2 and calmer resp status  Exam  RASS -2 RR 30 but not paradoxical Vitals ok Exam - no wheeze  IV Access  - 2 x PIV    NEuro COnsult  - EEG: ativan related frontal burts v seizure related  LAbs PULMONARY  Recent Labs Lab 09/15/13 0858 09/15/13 1051  PHART  --  7.145*  PCO2ART  --  18.7*  PO2ART  --  83.0  HCO3 6.5* 6.6*  TCO2 7 7  O2SAT 74.0 94.0    CBC  Recent Labs Lab 09/16/13 0424 09/17/13 0345 09/19/13 0649  HGB 11.6* 11.6* 11.7*  HCT 33.4* 32.8* 35.2*  WBC 11.6* 11.8* 17.4*  PLT 253 224 179    COAGULATION  Recent Labs Lab 09/15/13 1517 09/19/13 1539  INR 1.39 1.39    CARDIAC   Recent Labs Lab 09/15/13 1211 09/15/13 1754 09/16/13 0037  TROPONINI <0.30 <0.30 <0.30   No results found for this basename: PROBNP,  in the last 168 hours   CHEMISTRY  Recent Labs Lab 09/15/13 1211 09/15/13 1342  09/16/13 0424 09/16/13 1100 09/17/13 0345 09/18/13 0345 09/19/13 0649  NA 137  --   < > 146 149* 149* 147 150*  K 6.5*  --   < > 4.9 4.4 3.7 3.7 3.7  CL 109  --   < > 116* 118* 114* 112 112  CO2 <7*  --   < > 13* 16* 20 24 24   GLUCOSE 157*  --   < > 159* 201* 194* 172* 118*  BUN 136*  --   < > 118* 107* 77* 45* 29*  CREATININE 9.78*  --   < > 7.32* 6.16* 3.64* 2.20* 1.79*  CALCIUM 6.2*  --   < > 6.6* 6.7* 6.6* 7.0* 7.2*  MG  --  2.5  --   --   --   --   --   --   PHOS  --  5.8*  --   --   --   --   --   --   < > = values in this interval not displayed. Estimated Creatinine Clearance: 43.4 ml/min (by C-G formula based on Cr of 1.79).   LIVER  Recent Labs Lab 09/15/13 0754 09/15/13 1517 09/16/13 0424 09/17/13 0345 09/18/13 0345 09/19/13 1539  AST 402*  --  315* 324* 138*  --   ALT 534*  --  533* 586* 455*  --   ALKPHOS 328*  --  339* 356* 385*  --    BILITOT 0.6  --  0.7 1.2 1.7*  --   PROT 4.5*  --  4.3* 4.3* 4.4*  --   ALBUMIN 2.2*  --  2.0* 2.0* 2.0*  --   INR  --  1.39  --   --   --  1.39     INFECTIOUS  Recent Labs Lab 09/15/13 0902  LATICACIDVEN 2.67*     ENDOCRINE CBG (last 3)   Recent Labs  09/19/13 1208 09/19/13 1555 09/19/13 2043  GLUCAP 166* 90 88    AMMONIA LEVEL  53 and normal     IMAGING x48h Ct Head Wo Contrast  09/19/2013   CLINICAL DATA:  Fever.  Seizure-like activity .  EXAM: CT HEAD WITHOUT CONTRAST  TECHNIQUE: Contiguous axial images were obtained from the  base of the skull through the vertex without intravenous contrast.  COMPARISON:  None.  FINDINGS: Motion artifact. No intra-axial or extra-axial pathologic fold blood collection. No mass lesion. No hydrocephalus. No hemorrhage. Orbits are unremarkable. Mucosal thickening noted in frontal and ethmoid sinuses consistent with mild sinusitis. No significant fluid collections in the mastoids. No acute bony abnormality.  IMPRESSION: 1. No acute intracranial abnormality. 2. Mild mucosal thickening noted in the frontal and ethmoidal sinuses consistent with mild sinusitis. Sinusitis may be chronic.   Electronically Signed   By: Marcello Moores  Register   On: 09/19/2013 18:29    A) Acute encephalopathy Possible Seizure Acute Respiratory Distress - improved and not in need of urgen intubatin after starting precedex  All in setting of admission renal failur and transaminitis - resolving   PLAN  - check abg, lft, inr, pct, lactate, bmet   - dc vanc in setting of renal failure  - eMD will try to figure out timing of LP tonight v tomorrow morning - intubation depends on this - dc tylenol due to increased lft  Dr. Brand Males, M.D., [facility].C.P Pulmonary and Critical Care Medicine Staff Physician [place] [facility] Pager: [phone], If no answer or between  15:00h - 7:00h: call [phone]  09/19/2013 10:02  PM   Critical care time 35 minutes

## 2013-09-19 NOTE — Progress Notes (Signed)
EEG completed, results pending. 

## 2013-09-19 NOTE — Progress Notes (Signed)
Pt unable to remain stiil for LP, MD made aware. New orders received. Will continue to monitor.

## 2013-09-19 NOTE — Progress Notes (Signed)
Dr. Lake Bells called this morning regarding the vitals sings seenbelow and patient noted to be having facial twitching that does not last long but is very frequent. Some slight twitching noted in arms and legs. No new orders but pt to be seen soon. Will continue to monitor.    09/19/13 0754  Vitals  Temp ! 103.2 F (39.6 C)  Temp src Oral  BP 139/70 mmHg  MAP (mmHg) 84  BP Location Right arm  BP Method Automatic  Patient Position (if appropriate) Lying  Pulse Rate ! 127  Pulse Rate Source Monitor  ECG Heart Rate ! 123  Cardiac Rhythm ST  Resp ! 29

## 2013-09-19 NOTE — Progress Notes (Signed)
Pt still remains both tachycardic, tachypnea, febrile and very confused. Md aware. No new orders at this time. Will continue to monitor.

## 2013-09-19 NOTE — Progress Notes (Signed)
This RN spk with Dr Stuart([phone]), who states that he will assess the patient during evening rounds.

## 2013-09-19 NOTE — Progress Notes (Signed)
eLink Physician-Brief Progress Note Patient Name: William Stark DOB: 1945-09-14 MRN: [id number]   Date of Service  09/19/2013  HPI/Events of Note   Delirium Unable to CT / LP   eICU Interventions   Start Precedex May need to intubate / sedate      Intervention Category Major Interventions: Delirium, psychosis, severe agitation - evaluation and management  Osiris Odriscoll 09/19/2013, 6:43 PM

## 2013-09-19 NOTE — Progress Notes (Signed)
Pt transferred to [id number] per order via bed. Report called by dayshift RN, no belongings at bedside. CCMD notified of pt transfer and also called pt spouse/Ruby with updated bed assignment

## 2013-09-19 NOTE — Procedures (Signed)
History: 60 [address] [place] with altered mental status likely in the setting of sepsis.  Sedation: Intermittent Ativan pushes  Technique: This is a 17 channel routine scalp EEG performed at the bedside with bipolar and monopolar montages arranged in accordance to the international 10/20 system of electrode placement. One channel was dedicated to EKG recording.    Background: The background is moderately disorganized predominantly theta, alpha and beta activities. There are intermittent runs of frontocentrally predominant sharply contoured theta activity lasting typically 1-3 seconds, though at times going for 10-15 seconds with some suggestion of evolution. This comes in bursts occurring every 5-20 seconds.  Photic stimulation: Physiologic driving is not performed  EEG Abnormalities: 1) recurrent bursts of sharply contoured frontocentrally predominant beta activity. 2) generalized regular slow activity  Clinical Interpretation: This EEG could be consistent with a moderate nonspecific generalized cerebral dysfunction with superimposed medication effect in the form of beta activity secondary previously administered Ativan. There is a possibility that the recurrent bursts of activity could be epileptiform in nature, but this is not definite by this study. Repeat study with Ativan administration may be helpful to differentiate.   Results of this study were discussed with primary team.  Roland Rack, MD [facility] [phone]  If 7pm- 7am, please page neurology on call as listed in [place].

## 2013-09-20 ENCOUNTER — Inpatient Hospital Stay (HOSPITAL_COMMUNITY): Payer: Medicare Other

## 2013-09-20 DIAGNOSIS — G9340 Encephalopathy, unspecified: Secondary | ICD-10-CM

## 2013-09-20 DIAGNOSIS — R279 Unspecified lack of coordination: Secondary | ICD-10-CM

## 2013-09-20 DIAGNOSIS — R278 Other lack of coordination: Secondary | ICD-10-CM

## 2013-09-20 LAB — PROCALCITONIN: Procalcitonin: 2.22 ng/mL

## 2013-09-20 LAB — URINALYSIS, ROUTINE W REFLEX MICROSCOPIC
Glucose, UA: NEGATIVE mg/dL
KETONES UR: 15 mg/dL — AB
Leukocytes, UA: NEGATIVE
Nitrite: NEGATIVE
PROTEIN: 100 mg/dL — AB
Specific Gravity, Urine: 1.026 (ref 1.005–1.030)
Urobilinogen, UA: 0.2 mg/dL (ref 0.0–1.0)
pH: 6 (ref 5.0–8.0)

## 2013-09-20 LAB — COMPREHENSIVE METABOLIC PANEL
ALT: 240 U/L — ABNORMAL HIGH (ref 0–53)
ANION GAP: 13 (ref 5–15)
AST: 111 U/L — AB (ref 0–37)
Albumin: 2 g/dL — ABNORMAL LOW (ref 3.5–5.2)
Alkaline Phosphatase: 448 U/L — ABNORMAL HIGH (ref 39–117)
BUN: 32 mg/dL — AB (ref 6–23)
CO2: 24 mEq/L (ref 19–32)
Calcium: 6.8 mg/dL — ABNORMAL LOW (ref 8.4–10.5)
Chloride: 116 mEq/L — ABNORMAL HIGH (ref 96–112)
Creatinine, Ser: 1.79 mg/dL — ABNORMAL HIGH (ref 0.50–1.35)
GFR calc Af Amer: 43 mL/min — ABNORMAL LOW (ref >90)
GFR calc non Af Amer: 37 mL/min — ABNORMAL LOW (ref >90)
Glucose, Bld: 125 mg/dL — ABNORMAL HIGH (ref 70–99)
POTASSIUM: 3.6 meq/L — AB (ref 3.7–5.3)
Sodium: 153 mEq/L — ABNORMAL HIGH (ref 137–147)
TOTAL PROTEIN: 4 g/dL — AB (ref 6.0–8.3)
Total Bilirubin: 1.3 mg/dL — ABNORMAL HIGH (ref 0.3–1.2)

## 2013-09-20 LAB — CBC
HCT: 32.4 % — ABNORMAL LOW (ref 39.0–52.0)
HEMOGLOBIN: 10.8 g/dL — AB (ref 13.0–17.0)
MCH: 29.4 pg (ref 26.0–34.0)
MCHC: 33.3 g/dL (ref 30.0–36.0)
MCV: 88.3 fL (ref 78.0–100.0)
Platelets: 153 10*3/uL (ref 150–400)
RBC: 3.67 MIL/uL — ABNORMAL LOW (ref 4.22–5.81)
RDW: 15.1 % (ref 11.5–15.5)
WBC: 24.6 10*3/uL — AB (ref 4.0–10.5)

## 2013-09-20 LAB — GLUCOSE, CAPILLARY
GLUCOSE-CAPILLARY: 140 mg/dL — AB (ref 70–99)
Glucose-Capillary: 129 mg/dL — ABNORMAL HIGH (ref 70–99)
Glucose-Capillary: 98 mg/dL (ref 70–99)
Glucose-Capillary: 111 mg/dL — ABNORMAL HIGH (ref 70–99)

## 2013-09-20 LAB — RPR

## 2013-09-20 LAB — URINE MICROSCOPIC-ADD ON

## 2013-09-20 MED ORDER — CETYLPYRIDINIUM CHLORIDE 0.05 % MT LIQD
7.0000 mL | Freq: Four times a day (QID) | OROMUCOSAL | Status: DC
  Administered 2013-09-20 – 2013-09-21 (×5): 7 mL via OROMUCOSAL

## 2013-09-20 MED ORDER — MIDAZOLAM HCL 2 MG/2ML IJ SOLN
INTRAMUSCULAR | Status: AC
  Administered 2013-09-20: 2 mg
  Filled 2013-09-20: qty 2

## 2013-09-20 MED ORDER — KCL IN DEXTROSE-NACL 20-5-0.45 MEQ/L-%-% IV SOLN
INTRAVENOUS | Status: DC
  Filled 2013-09-20 (×2): qty 1000

## 2013-09-20 MED ORDER — FENTANYL CITRATE 0.05 MG/ML IJ SOLN
50.0000 ug | Freq: Once | INTRAMUSCULAR | Status: AC
  Administered 2013-09-20: 100 ug via INTRAVENOUS

## 2013-09-20 MED ORDER — ENOXAPARIN SODIUM 40 MG/0.4ML ~~LOC~~ SOLN
40.0000 mg | SUBCUTANEOUS | Status: DC
Start: 2013-09-20 — End: 2013-09-21
  Administered 2013-09-20 – 2013-09-21 (×2): 40 mg via SUBCUTANEOUS
  Filled 2013-09-20 (×2): qty 0.4

## 2013-09-20 MED ORDER — MIDAZOLAM HCL 2 MG/2ML IJ SOLN
2.0000 mg | Freq: Once | INTRAMUSCULAR | Status: AC
  Administered 2013-09-20: 2 mg via INTRAVENOUS

## 2013-09-20 MED ORDER — POTASSIUM CL IN DEXTROSE 5% 20 MEQ/L IV SOLN
20.0000 meq | INTRAVENOUS | Status: DC
  Administered 2013-09-20 – 2013-09-21 (×2): 20 meq via INTRAVENOUS
  Filled 2013-09-20 (×4): qty 1000

## 2013-09-20 MED ORDER — SODIUM CHLORIDE 0.9 % IV BOLUS (SEPSIS)
500.0000 mL | Freq: Once | INTRAVENOUS | Status: AC
  Administered 2013-09-20: 500 mL via INTRAVENOUS

## 2013-09-20 MED ORDER — CHLORHEXIDINE GLUCONATE 0.12 % MT SOLN
15.0000 mL | Freq: Two times a day (BID) | OROMUCOSAL | Status: DC
  Administered 2013-09-20: 15 mL via OROMUCOSAL
  Filled 2013-09-20: qty 15

## 2013-09-20 MED ORDER — PANTOPRAZOLE SODIUM 40 MG IV SOLR
40.0000 mg | Freq: Every day | INTRAVENOUS | Status: DC
  Administered 2013-09-20: 40 mg via INTRAVENOUS
  Filled 2013-09-20 (×2): qty 40

## 2013-09-20 MED ORDER — FENTANYL CITRATE 0.05 MG/ML IJ SOLN: 12.5000 ug | INTRAMUSCULAR | Status: DC | PRN

## 2013-09-20 MED ORDER — INSULIN ASPART 100 UNIT/ML ~~LOC~~ SOLN
0.0000 [IU] | SUBCUTANEOUS | Status: DC
  Administered 2013-09-20 – 2013-09-21 (×4): 2 [IU] via SUBCUTANEOUS

## 2013-09-20 MED ORDER — CHLORHEXIDINE GLUCONATE 0.12 % MT SOLN: 15.0000 mL | Freq: Two times a day (BID) | OROMUCOSAL | Status: DC

## 2013-09-20 MED ORDER — HALOPERIDOL LACTATE 5 MG/ML IJ SOLN: 1.0000 mg | INTRAMUSCULAR | Status: DC | PRN

## 2013-09-20 MED ORDER — SODIUM CHLORIDE 0.9 % IV SOLN
0.0000 ug/h | INTRAVENOUS | Status: DC
  Administered 2013-09-20: 50 ug/h via INTRAVENOUS
  Filled 2013-09-20: qty 50

## 2013-09-20 MED ORDER — DEXTROSE 5 % IV SOLN
30.0000 ug/min | INTRAVENOUS | Status: DC
  Administered 2013-09-20: 50 ug/min via INTRAVENOUS
  Administered 2013-09-20 (×2): 70 ug/min via INTRAVENOUS
  Administered 2013-09-21: 60 ug/min via INTRAVENOUS
  Filled 2013-09-20 (×4): qty 4

## 2013-09-20 MED ORDER — FENTANYL BOLUS VIA INFUSION
25.0000 ug | INTRAVENOUS | Status: DC | PRN
  Filled 2013-09-20: qty 50

## 2013-09-20 MED ORDER — FENTANYL CITRATE 0.05 MG/ML IJ SOLN
100.0000 ug | Freq: Once | INTRAMUSCULAR | Status: AC
  Administered 2013-09-20: 100 ug via INTRAVENOUS

## 2013-09-20 MED ORDER — FENTANYL CITRATE 0.05 MG/ML IJ SOLN
INTRAMUSCULAR | Status: AC
  Administered 2013-09-20: 100 ug via INTRAVENOUS
  Filled 2013-09-20: qty 2

## 2013-09-20 MED ORDER — FENTANYL CITRATE 0.05 MG/ML IJ SOLN
INTRAMUSCULAR | Status: AC
  Administered 2013-09-20: 100 ug
  Filled 2013-09-20: qty 2

## 2013-09-20 MED ORDER — SODIUM CHLORIDE 0.9 % IV SOLN
INTRAVENOUS | Status: DC
  Administered 2013-09-19: 23:00:00 via INTRAVENOUS

## 2013-09-20 MED ORDER — ALPRAZOLAM 0.5 MG PO TABS
0.5000 mg | ORAL_TABLET | Freq: Three times a day (TID) | ORAL | Status: DC | PRN
  Administered 2013-09-20 – 2013-09-26 (×6): 0.5 mg via ORAL
  Filled 2013-09-20 (×6): qty 1

## 2013-09-20 MED ORDER — CETYLPYRIDINIUM CHLORIDE 0.05 % MT LIQD: 7.0000 mL | Freq: Four times a day (QID) | OROMUCOSAL | Status: DC

## 2013-09-20 NOTE — Procedures (Signed)
Staff MD   - personally staffed bedside procedure . Real time 2D ultrasound used for vein site selection, patency assessment, and needle entry. A A record of image was made but could not be submitted for filing due to malfunction of printing device  Dr. Brand Males, M.D., [facility].C.P Pulmonary and Critical Care Medicine Staff Physician [place] [facility] Pager: [phone], If no answer or between  15:00h - 7:00h: call [phone]  09/20/2013 10:32 AM

## 2013-09-20 NOTE — Procedures (Signed)
Extubation Procedure Note  Patient Details:   Name: William Stark DOB: 04/14/1945 MRN: [id number]   Airway Documentation:     Evaluation  O2 sats: stable throughout Complications: No apparent complications Patient did tolerate procedure well. Bilateral Breath Sounds: Clear Suctioning: Airway Yes PT was extubated to 3L N/C PT was able to tell me his name. Sats are stable   Darina Hartwell, Leonie Douglas 09/20/2013, 11:54 AM

## 2013-09-20 NOTE — Procedures (Signed)
Intubation Procedure Note MAKIH STEFANKO [id number] February 15, 1945  Procedure: Intubation Indications: Respiratory insufficiency  Procedure Details Consent: Unable to obtain consent because of altered level of consciousness. Time Out: Verified patient identification, verified procedure, site/side was marked, verified correct patient position, special equipment/implants available, medications/allergies/relevent history reviewed, required imaging and test results available.  Performed  Maximum sterile technique was used including cap, gloves, gown, hand hygiene and mask.  MAC and 4    Evaluation Hemodynamic Status: BP stable throughout; O2 sats: stable throughout Patient's Current Condition: stable Complications: No apparent complications Patient did tolerate procedure well. Chest X-ray ordered to verify placement.  CXR: pending.  Glidescope used and tube visualized passing through cords.   Montey Hora, [place] [facility] Pgr: [phone]  or [phone]

## 2013-09-20 NOTE — Procedures (Signed)
Central Venous Catheter Insertion Procedure Note William Stark [id number] [birth year]  Procedure: Insertion of Central Venous Catheter Indications: Assessment of intravascular volume, Drug and/or fluid administration and Frequent blood sampling  Procedure Details Consent: Unable to obtain consent because of altered level of consciousness. Time Out: Verified patient identification, verified procedure, site/side was marked, verified correct patient position, special equipment/implants available, medications/allergies/relevent history reviewed, required imaging and test results available.  Performed  Maximum sterile technique was used including antiseptics, cap, gloves, gown, hand hygiene, mask and sheet. Skin prep: Chlorhexidine; local anesthetic administered A antimicrobial bonded/coated triple lumen catheter was placed in the right internal jugular vein using the Seldinger technique.  Evaluation Blood flow good Complications: No apparent complications Patient did tolerate procedure well. Chest X-ray ordered to verify placement.  CXR: normal.  Procedure performed under direct ultrasound guidance for real time vessel cannulation.      William Stark, [place] [facility] Pgr: [phone]  or [phone]

## 2013-09-20 NOTE — Consult Note (Signed)
Reason for Consult: Encephalopathic state; rule out seizure activity.  HPI:                                                                                                                                          William Stark is an 68 y.o. male Mr. diabetes mellitus and hypertension, who was admitted on 23/55/7322 acute metabolic encephalopathy with acute renal failure, hepatic failure and metabolic acidosis. Serum creatinine was 10.7 on admission. Potassium was 6.3. Patient has been confused and agitated. He was noted by nursing staff to have focal twitching involving face and arms earlier today. Patient remained alert and interactive. An EEG was obtained which showed nonspecific generalized slowing, as well as recurrent bursts of activity that could not be ruled out as epileptiform in nature involving the frontal regions. Patient has no history of seizure disorder. His been started on Precedex for management of agitation. His been no recurrence of focal motor twitching reported. He is not on anticonvulsant medication. CT scan of his head earlier today showed no acute intracranial abnormality.  Past Medical History  Diagnosis Date  . Hypertension   . Diabetes mellitus without complication   . Cancer     Past Surgical History  Procedure Laterality Date  . Colon surgery    . Appendectomy    . Tonsillectomy    . Cholecystectomy      History reviewed. No pertinent family history.  Social History:  reports that he has been smoking.  He does not have any smokeless tobacco history on file. He reports that he does not drink alcohol or use illicit drugs.  No Known Allergies  MEDICATIONS:                                                                                                                     I have reviewed the patient's current medications.   ROS:  History obtained from unobtainable from patient due to mental status   Blood pressure 78/49, pulse 98, temperature 100.5 F (38.1 C), temperature source Oral, resp. rate 28, height 6' (1.829 m), weight 89.6 kg (197 lb 8.5 oz), SpO2 98.00%.  Neurologic Examination:                                                                                                      Patient was somnolent but could be aroused for brief periods. He was disoriented to time as well as place. He was not corporative with examination. No facial nor extremity muscle twitching was noted. Pupils were equal and reacted normally to light. Extraocular movements were intact the right lateral gaze. No facial weakness was noted. Patient moved extremities equally with no signs of focal weakness. Muscle tone was flaccid throughout. Patient also had fairly prominent asterixis with minimal extension bilaterally. Deep tendon reflexes were absent throughout. Plantar responses were mute bilaterally.  No results found for this basename: cbc, bmp, coags, chol, tri, ldl, hga1c    Results for orders placed during the hospital encounter of 09/15/13 (from the past 48 hour(s))  HEPATIC FUNCTION PANEL     Status: Abnormal   Collection Time    09/18/13  3:45 AM      Result Value Ref Range   Total Protein 4.4 (*) 6.0 - 8.3 g/dL   Albumin 2.0 (*) 3.5 - 5.2 g/dL   AST 138 (*) 0 - 37 U/L   ALT 455 (*) 0 - 53 U/L   Alkaline Phosphatase 385 (*) 39 - 117 U/L   Total Bilirubin 1.7 (*) 0.3 - 1.2 mg/dL   Bilirubin, Direct 1.1 (*) 0.0 - 0.3 mg/dL   Indirect Bilirubin 0.6  0.3 - 0.9 mg/dL  BASIC METABOLIC PANEL     Status: Abnormal   Collection Time    09/18/13  3:45 AM      Result Value Ref Range   Sodium 147  137 - 147 mEq/L   Potassium 3.7  3.7 - 5.3 mEq/L   Chloride 112  96 - 112 mEq/L   CO2 24  19 - 32 mEq/L   Glucose, Bld 172 (*) 70 - 99 mg/dL   BUN 45 (*) 6 - 23 mg/dL   Comment: DELTA CHECK NOTED   Creatinine, Ser 2.20 (*) 0.50  - 1.35 mg/dL   Comment: DELTA CHECK NOTED   Calcium 7.0 (*) 8.4 - 10.5 mg/dL   GFR calc non Af Amer 29 (*) >90 mL/min   GFR calc Af Amer 34 (*) >90 mL/min   Comment: (NOTE)     The eGFR has been calculated using the CKD EPI equation.     This calculation has not been validated in all clinical situations.     eGFR's persistently <90 mL/min signify possible Chronic Kidney     Disease.   Anion gap 11  5 - 15  GLUCOSE, CAPILLARY     Status: Abnormal   Collection Time    09/18/13  7:53 AM      Result Value Ref Range  Glucose-Capillary 208 (*) 70 - 99 mg/dL   Comment 1 Documented in Chart     Comment 2 Notify RN    GLUCOSE, CAPILLARY     Status: Abnormal   Collection Time    09/18/13 11:16 AM      Result Value Ref Range   Glucose-Capillary 209 (*) 70 - 99 mg/dL   Comment 1 Documented in Chart     Comment 2 Notify RN    GLUCOSE, CAPILLARY     Status: Abnormal   Collection Time    09/18/13  3:53 PM      Result Value Ref Range   Glucose-Capillary 153 (*) 70 - 99 mg/dL  GLUCOSE, CAPILLARY     Status: Abnormal   Collection Time    09/18/13  9:52 PM      Result Value Ref Range   Glucose-Capillary 124 (*) 70 - 99 mg/dL  BASIC METABOLIC PANEL     Status: Abnormal   Collection Time    09/19/13  6:49 AM      Result Value Ref Range   Sodium 150 (*) 137 - 147 mEq/L   Potassium 3.7  3.7 - 5.3 mEq/L   Chloride 112  96 - 112 mEq/L   CO2 24  19 - 32 mEq/L   Glucose, Bld 118 (*) 70 - 99 mg/dL   BUN 29 (*) 6 - 23 mg/dL   Creatinine, Ser 1.79 (*) 0.50 - 1.35 mg/dL   Calcium 7.2 (*) 8.4 - 10.5 mg/dL   GFR calc non Af Amer 37 (*) >90 mL/min   GFR calc Af Amer 43 (*) >90 mL/min   Comment: (NOTE)     The eGFR has been calculated using the CKD EPI equation.     This calculation has not been validated in all clinical situations.     eGFR's persistently <90 mL/min signify possible Chronic Kidney     Disease.   Anion gap 14  5 - 15  CBC     Status: Abnormal   Collection Time    09/19/13   6:49 AM      Result Value Ref Range   WBC 17.4 (*) 4.0 - 10.5 K/uL   RBC 3.96 (*) 4.22 - 5.81 MIL/uL   Hemoglobin 11.7 (*) 13.0 - 17.0 g/dL   HCT 35.2 (*) 39.0 - 52.0 %   MCV 88.9  78.0 - 100.0 fL   MCH 29.5  26.0 - 34.0 pg   MCHC 33.2  30.0 - 36.0 g/dL   RDW 14.9  11.5 - 15.5 %   Platelets 179  150 - 400 K/uL  GLUCOSE, CAPILLARY     Status: Abnormal   Collection Time    09/19/13  7:56 AM      Result Value Ref Range   Glucose-Capillary 124 (*) 70 - 99 mg/dL  CLOSTRIDIUM DIFFICILE BY PCR     Status: None   Collection Time    09/19/13  8:27 AM      Result Value Ref Range   C difficile by pcr NEGATIVE  NEGATIVE  HEPATITIS PANEL, ACUTE     Status: None   Collection Time    09/19/13 10:40 AM      Result Value Ref Range   Hepatitis B Surface Ag NEGATIVE  NEGATIVE   HCV Ab NEGATIVE  NEGATIVE   Hep A IgM NON REACTIVE  NON REACTIVE   Hep B C IgM NON REACTIVE  NON REACTIVE   Comment: (NOTE)     High  levels of Hepatitis B Core IgM antibody are detectable     during the acute stage of Hepatitis B. This antibody is used     to differentiate current from past HBV infection.     Performed at [facility]  HIV ANTIBODY (ROUTINE TESTING)     Status: None   Collection Time    09/19/13 10:40 AM      Result Value Ref Range   HIV 1&2 Ab, 4th Generation NONREACTIVE  NONREACTIVE   Comment: (NOTE)     A NONREACTIVE HIV Ag/Ab result does not exclude HIV infection since     the time frame for seroconversion is variable. If acute HIV infection     is suspected, a HIV-1 RNA Qualitative TMA test is recommended.     HIV-1/2 Antibody Diff         Not indicated.     HIV-1 RNA, Qual TMA           Not indicated.     PLEASE NOTE: This information has been disclosed to you from records     whose confidentiality may be protected by state law. If your state     requires such protection, then the state law prohibits you from making     any further disclosure of the information without the specific  written     consent of the person to whom it pertains, or as otherwise permitted     by law. A general authorization for the release of medical or other     information is NOT sufficient for this purpose.     The performance of this assay has not been clinically validated in     patients less than 55 years old.     Performed at [facility]  TSH     Status: None   Collection Time    09/19/13 10:40 AM      Result Value Ref Range   TSH 1.580  0.350 - 4.500 uIU/mL  GLUCOSE, CAPILLARY     Status: Abnormal   Collection Time    09/19/13 12:08 PM      Result Value Ref Range   Glucose-Capillary 166 (*) 70 - 99 mg/dL   Comment 1 Notify RN     Comment 2 Documented in Chart    AMMONIA     Status: None   Collection Time    09/19/13  3:20 PM      Result Value Ref Range   Ammonia 53  11 - 60 umol/L  PROTIME-INR     Status: Abnormal   Collection Time    09/19/13  3:39 PM      Result Value Ref Range   Prothrombin Time 17.1 (*) 11.6 - 15.2 seconds   INR 1.39  0.00 - 1.49  GLUCOSE, CAPILLARY     Status: None   Collection Time    09/19/13  3:55 PM      Result Value Ref Range   Glucose-Capillary 90  70 - 99 mg/dL   Comment 1 Notify RN     Comment 2 Documented in Chart    GLUCOSE, CAPILLARY     Status: None   Collection Time    09/19/13  8:43 PM      Result Value Ref Range   Glucose-Capillary 88  70 - 99 mg/dL  BLOOD GAS, ARTERIAL     Status: Abnormal   Collection Time    09/19/13 10:01 PM      Result Value Ref  Range   O2 Content 1.0     Delivery systems NASAL CANNULA     pH, Arterial 7.448  7.350 - 7.450   pCO2 arterial 36.1  35.0 - 45.0 mmHg   pO2, Arterial 71.4 (*) 80.0 - 100.0 mmHg   Bicarbonate 24.6 (*) 20.0 - 24.0 mEq/L   TCO2 25.7  0 - 100 mmol/L   Acid-Base Excess 1.0  0.0 - 2.0 mmol/L   O2 Saturation 93.0     Patient temperature 98.6     Collection site RIGHT RADIAL     Drawn by [phone]     Sample type ARTERIAL     Allens test (pass/fail) PASS  PASS  GLUCOSE,  CAPILLARY     Status: None   Collection Time    09/19/13 10:29 PM      Result Value Ref Range   Glucose-Capillary 96  70 - 99 mg/dL  PROTIME-INR     Status: Abnormal   Collection Time    09/19/13 11:00 PM      Result Value Ref Range   Prothrombin Time 17.1 (*) 11.6 - 15.2 seconds   INR 1.39  0.00 - 6.19  BASIC METABOLIC PANEL     Status: Abnormal   Collection Time    09/19/13 11:00 PM      Result Value Ref Range   Sodium 150 (*) 137 - 147 mEq/L   Potassium 3.4 (*) 3.7 - 5.3 mEq/L   Chloride 113 (*) 96 - 112 mEq/L   CO2 24  19 - 32 mEq/L   Glucose, Bld 93  70 - 99 mg/dL   BUN 30 (*) 6 - 23 mg/dL   Creatinine, Ser 1.83 (*) 0.50 - 1.35 mg/dL   Calcium 6.9 (*) 8.4 - 10.5 mg/dL   GFR calc non Af Amer 36 (*) >90 mL/min   GFR calc Af Amer 42 (*) >90 mL/min   Comment: (NOTE)     The eGFR has been calculated using the CKD EPI equation.     This calculation has not been validated in all clinical situations.     eGFR's persistently <90 mL/min signify possible Chronic Kidney     Disease.   Anion gap 13  5 - 15  LACTIC ACID, PLASMA     Status: Abnormal   Collection Time    09/19/13 11:00 PM      Result Value Ref Range   Lactic Acid, Venous 2.4 (*) 0.5 - 2.2 mmol/L  CBC     Status: Abnormal   Collection Time    09/19/13 11:00 PM      Result Value Ref Range   WBC 18.6 (*) 4.0 - 10.5 K/uL   RBC 3.70 (*) 4.22 - 5.81 MIL/uL   Hemoglobin 10.9 (*) 13.0 - 17.0 g/dL   HCT 32.4 (*) 39.0 - 52.0 %   MCV 87.6  78.0 - 100.0 fL   MCH 29.5  26.0 - 34.0 pg   MCHC 33.6  30.0 - 36.0 g/dL   RDW 14.8  11.5 - 15.5 %   Platelets 154  150 - 400 K/uL  TROPONIN I     Status: None   Collection Time    09/19/13 11:00 PM      Result Value Ref Range   Troponin I <0.30  <0.30 ng/mL   Comment:            Due to the release kinetics of cTnI,     a negative result within the first hours  of the onset of symptoms does not rule out     myocardial infarction with certainty.     If myocardial infarction  is still suspected,     repeat the test at appropriate intervals.  LIPASE, BLOOD     Status: Abnormal   Collection Time    09/19/13 11:00 PM      Result Value Ref Range   Lipase 202 (*) 11 - 59 U/L  HEPATIC FUNCTION PANEL     Status: Abnormal   Collection Time    09/19/13 11:00 PM      Result Value Ref Range   Total Protein 4.2 (*) 6.0 - 8.3 g/dL   Albumin 2.0 (*) 3.5 - 5.2 g/dL   AST 136 (*) 0 - 37 U/L   ALT 258 (*) 0 - 53 U/L   Alkaline Phosphatase 459 (*) 39 - 117 U/L   Total Bilirubin 1.6 (*) 0.3 - 1.2 mg/dL   Bilirubin, Direct 1.0 (*) 0.0 - 0.3 mg/dL   Indirect Bilirubin 0.6  0.3 - 0.9 mg/dL    Ct Head Wo Contrast  09/19/2013   CLINICAL DATA:  Fever.  Seizure-like activity .  EXAM: CT HEAD WITHOUT CONTRAST  TECHNIQUE: Contiguous axial images were obtained from the base of the skull through the vertex without intravenous contrast.  COMPARISON:  None.  FINDINGS: Motion artifact. No intra-axial or extra-axial pathologic fold blood collection. No mass lesion. No hydrocephalus. No hemorrhage. Orbits are unremarkable. Mucosal thickening noted in frontal and ethmoid sinuses consistent with mild sinusitis. No significant fluid collections in the mastoids. No acute bony abnormality.  IMPRESSION: 1. No acute intracranial abnormality. 2. Mild mucosal thickening noted in the frontal and ethmoidal sinuses consistent with mild sinusitis. Sinusitis may be chronic.   Electronically Signed   By: Marcello Moores  Register   On: 09/19/2013 18:29   Assessment/Plan: 68 year old man with altered mental status with confusion of dictation associated with encephalopathic state, primarily metabolic in etiology. No signs seizure activity was noted during this evaluation. New-onset focal seizures cannot be ruled out, however.  Recommendations: 1. Recommend repeat EEG study, routine adult, with administration of Ativan 2 mg IV if the repeat studies shows burst of potentially epileptiform activity, as seen in study on  09/19/2013. 2. Would not start an anti-epilepsy drug was patient has frank an unequivocal seizure activity or EEG shows resolution of bursts of epileptiform activity on EEG. 3. MRI of the brain without contrast if feasible.  We will continue to follow this patient with you.  C.R. Nicole Kindred, MD [facility] [phone]  09/20/2013, 12:54 AM

## 2013-09-20 NOTE — Progress Notes (Addendum)
PULMONARY / CRITICAL CARE MEDICINE  Name: William Stark MRN: [id number] DOB: [birth year]    ADMISSION DATE:  09/15/2013 CONSULTATION DATE:  09/15/2013  REFERRING MD :  Alice Rieger   CHIEF COMPLAINT:  Hypotension and renal failure   INITIAL PRESENTATION: 68 y/o presented 9/4 with altered mental status, hypovolemia and acute renal failure Cr 10.72, baseline 1.4). Started on Vanc/Zosyn for suspected cellulitis  STUDIES / EVENTS:  90/4  Renal US: no hydronephrosis 9/04 Renal Consult: AKI due to hypovolemic shock, worsened on an ACE-I and NSAIDS . Metabolic acidosis with elevated lactate. Sodium bicarbonate initiated 9/05 - 9/08 Renal function improving. Cr  9/06 Low grade fever 9/07 - 9/08 increasing fever and confusion 9/08 Facial twitching per RN. Concern for seizure 9/08 EEG: This EEG could be consistent with a moderate nonspecific generalized cerebral dysfunction with superimposed medication effect in the form of beta activity secondary previously administered Ativan. There is a possibility that the recurrent bursts of activity could be epileptiform in nature, but this is not definite by this study. Repeat study with Ativan administration may be helpful to differentiate 9/08 HIV negative 9/08 PM: Worsening AMS/agitation - improved with dexmedetomidine. Intubated for "respiratory distress".  9/08 CT head: NAD 9/09 early AM: Neurology consult - recommended repeat EEG with lorazepam administration and MRI brain 9/09 Passed SBT. Extubated. Encephalopathic. Asterixis noted. ? Delusional thinking. PRN Haldol ordered  Lines/Tubes: R IJ CVL 9/04 >>  ETT 9/08 >> 9/09  Microbiology: MRSA PCR 9/04 >> NEG Blood 9/04 >> NEG C diff 9/08 >> NEG Stool cx 9/09 >>    Antibiotics:  Vanc 9/04 >> 9/09 Zosyn 9/04 >> 9/09   SUBJ:   Passed SBT. Followed commands. Extubated and looks good post-extubation. Encephalopathic. Poorly oriented. Delusional thinking  VITAL SIGNS: Temp:  [100.1 F (37.8  C)-103.8 F (39.9 C)] 100.2 F (37.9 C) (09/09 1554) Pulse Rate:  [84-127] 102 (09/09 1300) Resp:  [14-46] 31 (09/09 1400) BP: (74-156)/(49-117) 98/62 mmHg (09/09 1400) SpO2:  [96 %-100 %] 99 % (09/09 1300) FiO2 (%):  [40 %-100 %] 40 % (09/09 1110) Weight:  [89.6 kg (197 lb 8.5 oz)] 89.6 kg (197 lb 8.5 oz) (09/08 2100)  INTAKE / OUTPUT: Intake/Output     09/08 0701 - 09/09 0700 09/09 0701 - 09/10 0700   P.O.     I.V. (mL/kg) 320.9 (3.6) 343.7 (3.8)   IV Piggyback 50 62.5   Total Intake(mL/kg) 370.9 (4.1) 406.2 (4.5)   Urine (mL/kg/hr) 510 (0.2) 715 (0.9)   Stool 200 (0.1)    Total Output 710 715   Net -339.1 -308.8        Urine Occurrence 1 x    Stool Occurrence 2 x     PHYSICAL EXAMINATION: General:  Confused but cooperative, NAD Neuro:  RASS 0. + F/C. + asterixis. MAES HEENT: WNL Cardiovascular:  Reg, no M Lungs: clear Abdomen:  Soft, +BS Ext: 1-2+ R>L LE edema  LABS: I have reviewed all of today's lab results. Relevant abnormalities are discussed in the A/P section  CXR: NACPD  ASSESSMENT: Acute respiratory failure, resolved Sinus tachycardia due to fever AKI, nonoliguric - resolving Metabolic acidosis, resolved ? LE Cellulitis - clinically improved High fever, unclear etiology Chronic persistent diarrhea Erythema - generalized, resolved Acute encephalopathy with asterixis Possible Seizure Elevated LFTs DM II with mild hyperglycemia Heavy smoker  Plan: Extubated this AM Supp O2 as needed to maintain SpO2 > 92% Monitor hemodynamics Monitor BMET intermittently Monitor I/Os Correct electrolytes as indicated NPO post  extubation for now Monitor LFTs intermittently Neuro following - eval and mgmt per them. Discussed with Dr Aram Beecham PRN Haloperidol ordered 9/09 for agitation DC abx 9/09 Stool culture ordered 9/09 Wife updated @ bedside  45 mins CCM time  Merton Border, MD ; [facility] [phone].  After 5:30 PM or weekends, call  [phone]

## 2013-09-20 NOTE — Progress Notes (Signed)
Pt extubated at 1145. HR 104 ; 3L nasal cannula 98%. Patient is alert and oriented.

## 2013-09-20 NOTE — Progress Notes (Signed)
UR Completed.  [name] [phone] 09/20/2013

## 2013-09-20 NOTE — Progress Notes (Signed)
160 cc Fentanyl wasted at bedside. Enis Slipper, RN , witnessed.

## 2013-09-20 NOTE — Procedures (Signed)
Staff MD niote   - personally supervised bedside procedure  Dr. Brand Males, M.D., [facility].C.P Pulmonary and Critical Care Medicine Staff Physician [place] [facility] Pager: [phone], If no answer or between  15:00h - 7:00h: call [phone]  09/20/2013 10:31 AM

## 2013-09-20 NOTE — Progress Notes (Signed)
Dr Nicole Kindred neurologist at [facility]

## 2013-09-21 ENCOUNTER — Inpatient Hospital Stay (HOSPITAL_COMMUNITY): Payer: Medicare Other

## 2013-09-21 DIAGNOSIS — R509 Fever, unspecified: Secondary | ICD-10-CM

## 2013-09-21 DIAGNOSIS — R609 Edema, unspecified: Secondary | ICD-10-CM

## 2013-09-21 LAB — URINALYSIS, ROUTINE W REFLEX MICROSCOPIC
Bilirubin Urine: NEGATIVE
GLUCOSE, UA: NEGATIVE mg/dL
KETONES UR: NEGATIVE mg/dL
Leukocytes, UA: NEGATIVE
Nitrite: NEGATIVE
PH: 6 (ref 5.0–8.0)
Protein, ur: 100 mg/dL — AB
Specific Gravity, Urine: 1.015 (ref 1.005–1.030)
Urobilinogen, UA: 0.2 mg/dL (ref 0.0–1.0)

## 2013-09-21 LAB — GRAM STAIN

## 2013-09-21 LAB — CBC
HEMATOCRIT: 33.7 % — AB (ref 39.0–52.0)
HEMOGLOBIN: 11.3 g/dL — AB (ref 13.0–17.0)
MCH: 29.7 pg (ref 26.0–34.0)
MCHC: 33.5 g/dL (ref 30.0–36.0)
MCV: 88.5 fL (ref 78.0–100.0)
Platelets: 127 10*3/uL — ABNORMAL LOW (ref 150–400)
RBC: 3.81 MIL/uL — AB (ref 4.22–5.81)
RDW: 15.3 % (ref 11.5–15.5)
WBC: 23 10*3/uL — ABNORMAL HIGH (ref 4.0–10.5)

## 2013-09-21 LAB — CULTURE, BLOOD (ROUTINE X 2)
CULTURE: NO GROWTH
Culture: NO GROWTH

## 2013-09-21 LAB — GLUCOSE, CAPILLARY
GLUCOSE-CAPILLARY: 112 mg/dL — AB (ref 70–99)
GLUCOSE-CAPILLARY: 131 mg/dL — AB (ref 70–99)
GLUCOSE-CAPILLARY: 139 mg/dL — AB (ref 70–99)
Glucose-Capillary: 142 mg/dL — ABNORMAL HIGH (ref 70–99)
Glucose-Capillary: 138 mg/dL — ABNORMAL HIGH (ref 70–99)
Glucose-Capillary: 148 mg/dL — ABNORMAL HIGH (ref 70–99)

## 2013-09-21 LAB — CSF CELL COUNT WITH DIFFERENTIAL
Lymphs, CSF: 96 % — ABNORMAL HIGH (ref 40–80)
Lymphs, CSF: 96 % — ABNORMAL HIGH (ref 40–80)
Monocyte-Macrophage-Spinal Fluid: 2 % — ABNORMAL LOW (ref 15–45)
Monocyte-Macrophage-Spinal Fluid: 3 % — ABNORMAL LOW (ref 15–45)
RBC COUNT CSF: 1 /mm3 — AB
RBC Count, CSF: 115 /mm3 — ABNORMAL HIGH
SEGMENTED NEUTROPHILS-CSF: 1 % (ref 0–6)
Segmented Neutrophils-CSF: 2 % (ref 0–6)
Tube #: 1
Tube #: 4
WBC CSF: 20 /mm3 — AB (ref 0–5)
WBC, CSF: 19 /mm3 (ref 0–5)

## 2013-09-21 LAB — COMPREHENSIVE METABOLIC PANEL
ALT: 183 U/L — AB (ref 0–53)
ANION GAP: 10 (ref 5–15)
AST: 84 U/L — ABNORMAL HIGH (ref 0–37)
Albumin: 1.9 g/dL — ABNORMAL LOW (ref 3.5–5.2)
Alkaline Phosphatase: 450 U/L — ABNORMAL HIGH (ref 39–117)
BUN: 33 mg/dL — AB (ref 6–23)
CALCIUM: 6.6 mg/dL — AB (ref 8.4–10.5)
CO2: 25 meq/L (ref 19–32)
CREATININE: 1.99 mg/dL — AB (ref 0.50–1.35)
Chloride: 115 mEq/L — ABNORMAL HIGH (ref 96–112)
GFR calc non Af Amer: 33 mL/min — ABNORMAL LOW (ref >90)
GFR, EST AFRICAN AMERICAN: 38 mL/min — AB (ref >90)
Glucose, Bld: 166 mg/dL — ABNORMAL HIGH (ref 70–99)
Potassium: 3.5 mEq/L — ABNORMAL LOW (ref 3.7–5.3)
Sodium: 150 mEq/L — ABNORMAL HIGH (ref 137–147)
Total Bilirubin: 1 mg/dL (ref 0.3–1.2)
Total Protein: 3.9 g/dL — ABNORMAL LOW (ref 6.0–8.3)

## 2013-09-21 LAB — URINE MICROSCOPIC-ADD ON

## 2013-09-21 LAB — EXPECTORATED SPUTUM ASSESSMENT W REFEX TO RESP CULTURE

## 2013-09-21 LAB — PROTEIN AND GLUCOSE, CSF
GLUCOSE CSF: 65 mg/dL (ref 43–76)
Total  Protein, CSF: 95 mg/dL — ABNORMAL HIGH (ref 15–45)

## 2013-09-21 LAB — PROCALCITONIN: Procalcitonin: 2.41 ng/mL

## 2013-09-21 LAB — EXPECTORATED SPUTUM ASSESSMENT W GRAM STAIN, RFLX TO RESP C

## 2013-09-21 MED ORDER — GADOBENATE DIMEGLUMINE 529 MG/ML IV SOLN
10.0000 mL | Freq: Once | INTRAVENOUS | Status: AC | PRN
  Administered 2013-09-21: 10 mL via INTRAVENOUS

## 2013-09-21 MED ORDER — DEXTROSE 5 % IV SOLN
10.0000 mg/kg | Freq: Two times a day (BID) | INTRAVENOUS | Status: DC
  Administered 2013-09-21 – 2013-09-23 (×4): 775 mg via INTRAVENOUS
  Filled 2013-09-21 (×6): qty 15.5

## 2013-09-21 MED ORDER — HEPARIN (PORCINE) IN NACL 100-0.45 UNIT/ML-% IJ SOLN
1200.0000 [IU]/h | INTRAMUSCULAR | Status: DC
  Administered 2013-09-21: 1200 [IU]/h via INTRAVENOUS
  Filled 2013-09-21 (×2): qty 250

## 2013-09-21 MED ORDER — CETYLPYRIDINIUM CHLORIDE 0.05 % MT LIQD
7.0000 mL | Freq: Two times a day (BID) | OROMUCOSAL | Status: DC
  Administered 2013-09-21 – 2013-09-22 (×3): 7 mL via OROMUCOSAL

## 2013-09-21 MED ORDER — INSULIN ASPART 100 UNIT/ML ~~LOC~~ SOLN: 0.0000 [IU] | Freq: Every day | SUBCUTANEOUS | Status: DC

## 2013-09-21 MED ORDER — INSULIN ASPART 100 UNIT/ML ~~LOC~~ SOLN
0.0000 [IU] | Freq: Three times a day (TID) | SUBCUTANEOUS | Status: DC
  Administered 2013-09-21: 2 [IU] via SUBCUTANEOUS
  Administered 2013-09-22: 5 [IU] via SUBCUTANEOUS
  Administered 2013-09-22: 2 [IU] via SUBCUTANEOUS
  Administered 2013-09-23: 3 [IU] via SUBCUTANEOUS
  Administered 2013-09-23: 2 [IU] via SUBCUTANEOUS
  Administered 2013-09-23: 3 [IU] via SUBCUTANEOUS
  Administered 2013-09-25 (×2): 2 [IU] via SUBCUTANEOUS
  Administered 2013-09-26: 3 [IU] via SUBCUTANEOUS
  Administered 2013-09-26 – 2013-09-27 (×2): 2 [IU] via SUBCUTANEOUS
  Administered 2013-09-27: 5 [IU] via SUBCUTANEOUS
  Administered 2013-09-28: 2 [IU] via SUBCUTANEOUS

## 2013-09-21 MED ORDER — POTASSIUM CL IN DEXTROSE 5% 20 MEQ/L IV SOLN
20.0000 meq | INTRAVENOUS | Status: DC
  Administered 2013-09-21: 20 meq via INTRAVENOUS
  Filled 2013-09-21 (×4): qty 1000

## 2013-09-21 MED ORDER — INSULIN ASPART 100 UNIT/ML ~~LOC~~ SOLN
3.0000 [IU] | Freq: Three times a day (TID) | SUBCUTANEOUS | Status: DC
  Administered 2013-09-21 – 2013-09-22 (×3): 3 [IU] via SUBCUTANEOUS

## 2013-09-21 NOTE — Progress Notes (Signed)
[place] Progress Note Patient Name: William Stark DOB: 1945-07-16 MRN: [id number]   Date of Service  09/21/2013  HPI/Events of Note  LP results >>> WBC with lymphocytic predominance   eICU Interventions  Acyclovir started per Pharmacy HSV PCR pending      Intervention Category Intermediate Interventions: Other:  Doree Fudge 09/21/2013, 8:13 PM

## 2013-09-21 NOTE — Consult Note (Addendum)
[name]  Date of Admission:  09/15/2013  Date of Consult:  09/21/2013  Reason for Consult: fever Referring Physician: Alva Garnet  Impression/Recommendation Fever Leukocytosis Encepholopathy  Would check HIV RNA check RPR Check LP for routine, TB, fungus, state arbovirus panel Check RMSF and Ehrlichia MRI head ANA, ANCA Consider starting doxy Not clear how much of his presenting illness was due to bactrim toxicity. and now hospital acquired infection? Thank you so much for this interesting consult,   Bobby Rumpf (pager) [phone] [email or web address]  William Stark is an 68 y.o. male.  HPI:  68 yo M with hx chronic diarrhea after colectomy for colon CA(2006), admission on 9-4 with mental status change. He had recently been treated with bactrim and cefuroxime for 1 week pta  For cellulitis of RLE. He then developed poor po, dizzyness, weakness. He was adm with ARF, hyperkalemia (6.3), elvated LFTs, hypotension despite 4L of fluid. His pH was 7.145. Lactic acid was 2.67.  Prior to admission he was also on ACE-I, metformin.   He was started on vanco/zosyn. Bicarbonate drip.  9-7 temp 100.6. By 9-8 he had temp to 103.2 WBC 17.4.  His LFTs have been trending down, his Cr has been improving.  EEG: This EEG could be consistent with a moderate nonspecific generalized cerebral dysfunction with superimposed medication effect in the form of beta activity secondary previously administered Ativan. In hospital he has had continued agitation and has required precedex. CT of head was (-). By 9-9 he required intubation.     Past Medical History  Diagnosis Date  . Hypertension   . Diabetes mellitus without complication   . Cancer     Past Surgical History  Procedure Laterality Date  . Colon surgery    . Appendectomy    . Tonsillectomy    . Cholecystectomy       No Known Allergies  Medications:  Scheduled: . antiseptic oral rinse  7 mL  Mouth Rinse BID  . enoxaparin (LOVENOX) injection  40 mg Subcutaneous Q24H  . insulin aspart  0-15 Units Subcutaneous 6 times per day    Abtx:  Anti-infectives   Start     Dose/Rate Route Frequency Ordered Stop   09/19/13 2200  vancomycin (VANCOCIN) IVPB 750 mg/150 ml premix  Status:  Discontinued     750 mg 150 mL/hr over 60 Minutes Intravenous Every 12 hours 09/19/13 1043 09/19/13 2202   09/19/13 1000  vancomycin (VANCOCIN) IVPB 1000 mg/200 mL premix  Status:  Discontinued     1,000 mg 200 mL/hr over 60 Minutes Intravenous Every 24 hours 09/18/13 1216 09/19/13 1043   09/17/13 1100  piperacillin-tazobactam (ZOSYN) IVPB 3.375 g  Status:  Discontinued     3.375 g 12.5 mL/hr over 240 Minutes Intravenous 3 times per day 09/17/13 0922 09/20/13 1217   09/17/13 1000  vancomycin (VANCOCIN) IVPB 750 mg/150 ml premix  Status:  Discontinued     750 mg 150 mL/hr over 60 Minutes Intravenous Every 24 hours 09/17/13 0922 09/18/13 1216   09/15/13 1700  piperacillin-tazobactam (ZOSYN) IVPB 2.25 g  Status:  Discontinued     2.25 g 100 mL/hr over 30 Minutes Intravenous Every 6 hours 09/15/13 1354 09/17/13 0922   09/15/13 1000  vancomycin (VANCOCIN) 1,500 mg in sodium chloride 0.9 % 500 mL IVPB     1,500 mg 250 mL/hr over 120 Minutes Intravenous  Once 09/15/13 0959 09/15/13 1245   09/15/13 1000  piperacillin-tazobactam (ZOSYN) IVPB 3.375 g  3.375 g 100 mL/hr over 30 Minutes Intravenous  Once 09/15/13 0959 09/15/13 1057      Total days of antibiotics off day 2 9-4 vanco/ceftriaxone 9-9          Social History:  reports that he has been smoking.  He does not have any smokeless tobacco history on file. He reports that he does not drink alcohol or use illicit drugs. 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 Per pt has 2 monkeys home. Denies insect bites.   History reviewed. No pertinent family history.  General ROS: see hPI.   Blood pressure 91/64, pulse 118, temperature 99.6 F (37.6 C),  temperature source Oral, resp. rate 21, height 6' (1.829 m), weight 89.6 kg (197 lb 8.5 oz), SpO2 98.00%. General appearance: delirious and no distress Eyes: negative findings: pupils equal, round, reactive to light and accomodation Throat: normal findings: oropharynx pink & moist without lesions or evidence of thrush Neck: no adenopathy and supple, symmetrical, trachea midline Lungs: clear to auscultation bilaterally Heart: regular rate and rhythm Abdomen: normal findings: bowel sounds normal and soft, non-tender Extremities: edema anasarca. diffuse erythema.    Results for orders placed during the hospital encounter of 09/15/13 (from the past 48 hour(s))  AMMONIA     Status: None   Collection Time    09/19/13  3:20 PM      Result Value Ref Range   Ammonia 53  11 - 60 umol/L  PROTIME-INR     Status: Abnormal   Collection Time    09/19/13  3:39 PM      Result Value Ref Range   Prothrombin Time 17.1 (*) 11.6 - 15.2 seconds   INR 1.39  0.00 - 1.49  GLUCOSE, CAPILLARY     Status: None   Collection Time    09/19/13  3:55 PM      Result Value Ref Range   Glucose-Capillary 90  70 - 99 mg/dL   Comment 1 Notify RN     Comment 2 Documented in Chart    RPR     Status: None   Collection Time    09/19/13  7:43 PM      Result Value Ref Range   RPR NON REAC  NON REAC   Comment: Performed at [place], CAPILLARY     Status: None   Collection Time    09/19/13  8:43 PM      Result Value Ref Range   Glucose-Capillary 88  70 - 99 mg/dL  BLOOD GAS, ARTERIAL     Status: Abnormal   Collection Time    09/19/13 10:01 PM      Result Value Ref Range   O2 Content 1.0     Delivery systems NASAL CANNULA     pH, Arterial 7.448  7.350 - 7.450   pCO2 arterial 36.1  35.0 - 45.0 mmHg   pO2, Arterial 71.4 (*) 80.0 - 100.0 mmHg   Bicarbonate 24.6 (*) 20.0 - 24.0 mEq/L   TCO2 25.7  0 - 100 mmol/L   Acid-Base Excess 1.0  0.0 - 2.0 mmol/L   O2 Saturation 93.0     Patient  temperature 98.6     Collection site RIGHT RADIAL     Drawn by [phone]     Sample type ARTERIAL     Allens test (pass/fail) PASS  PASS  GLUCOSE, CAPILLARY     Status: None   Collection Time    09/19/13 10:29 PM  Result Value Ref Range   Glucose-Capillary 96  70 - 99 mg/dL  PROTIME-INR     Status: Abnormal   Collection Time    09/19/13 11:00 PM      Result Value Ref Range   Prothrombin Time 17.1 (*) 11.6 - 15.2 seconds   INR 1.39  0.00 - 0.25  BASIC METABOLIC PANEL     Status: Abnormal   Collection Time    09/19/13 11:00 PM      Result Value Ref Range   Sodium 150 (*) 137 - 147 mEq/L   Potassium 3.4 (*) 3.7 - 5.3 mEq/L   Chloride 113 (*) 96 - 112 mEq/L   CO2 24  19 - 32 mEq/L   Glucose, Bld 93  70 - 99 mg/dL   BUN 30 (*) 6 - 23 mg/dL   Creatinine, Ser 1.83 (*) 0.50 - 1.35 mg/dL   Calcium 6.9 (*) 8.4 - 10.5 mg/dL   GFR calc non Af Amer 36 (*) >90 mL/min   GFR calc Af Amer 42 (*) >90 mL/min   Comment: (NOTE)     The eGFR has been calculated using the CKD EPI equation.     This calculation has not been validated in all clinical situations.     eGFR's persistently <90 mL/min signify possible Chronic Kidney     Disease.   Anion gap 13  5 - 15  LACTIC ACID, PLASMA     Status: Abnormal   Collection Time    09/19/13 11:00 PM      Result Value Ref Range   Lactic Acid, Venous 2.4 (*) 0.5 - 2.2 mmol/L  CBC     Status: Abnormal   Collection Time    09/19/13 11:00 PM      Result Value Ref Range   WBC 18.6 (*) 4.0 - 10.5 K/uL   RBC 3.70 (*) 4.22 - 5.81 MIL/uL   Hemoglobin 10.9 (*) 13.0 - 17.0 g/dL   HCT 32.4 (*) 39.0 - 52.0 %   MCV 87.6  78.0 - 100.0 fL   MCH 29.5  26.0 - 34.0 pg   MCHC 33.6  30.0 - 36.0 g/dL   RDW 14.8  11.5 - 15.5 %   Platelets 154  150 - 400 K/uL  TROPONIN I     Status: None   Collection Time    09/19/13 11:00 PM      Result Value Ref Range   Troponin I <0.30  <0.30 ng/mL   Comment:            Due to the release kinetics of cTnI,     a negative  result within the first hours     of the onset of symptoms does not rule out     myocardial infarction with certainty.     If myocardial infarction is still suspected,     repeat the test at appropriate intervals.  LIPASE, BLOOD     Status: Abnormal   Collection Time    09/19/13 11:00 PM      Result Value Ref Range   Lipase 202 (*) 11 - 59 U/L  HEPATIC FUNCTION PANEL     Status: Abnormal   Collection Time    09/19/13 11:00 PM      Result Value Ref Range   Total Protein 4.2 (*) 6.0 - 8.3 g/dL   Albumin 2.0 (*) 3.5 - 5.2 g/dL   AST 136 (*) 0 - 37 U/L   ALT 258 (*) 0 - 53  U/L   Alkaline Phosphatase 459 (*) 39 - 117 U/L   Total Bilirubin 1.6 (*) 0.3 - 1.2 mg/dL   Bilirubin, Direct 1.0 (*) 0.0 - 0.3 mg/dL   Indirect Bilirubin 0.6  0.3 - 0.9 mg/dL  URINALYSIS, ROUTINE W REFLEX MICROSCOPIC     Status: Abnormal   Collection Time    09/20/13  2:31 AM      Result Value Ref Range   Color, Urine AMBER (*) YELLOW   Comment: BIOCHEMICALS MAY BE AFFECTED BY COLOR   APPearance CLOUDY (*) CLEAR   Specific Gravity, Urine 1.026  1.005 - 1.030   pH 6.0  5.0 - 8.0   Glucose, UA NEGATIVE  NEGATIVE mg/dL   Hgb urine dipstick TRACE (*) NEGATIVE   Bilirubin Urine SMALL (*) NEGATIVE   Ketones, ur 15 (*) NEGATIVE mg/dL   Protein, ur 100 (*) NEGATIVE mg/dL   Urobilinogen, UA 0.2  0.0 - 1.0 mg/dL   Nitrite NEGATIVE  NEGATIVE   Leukocytes, UA NEGATIVE  NEGATIVE  URINE MICROSCOPIC-ADD ON     Status: Abnormal   Collection Time    09/20/13  2:31 AM      Result Value Ref Range   Squamous Epithelial / LPF RARE  RARE   WBC, UA 0-2  <3 WBC/hpf   RBC / HPF 0-2  <3 RBC/hpf   Bacteria, UA MANY (*) RARE  COMPREHENSIVE METABOLIC PANEL     Status: Abnormal   Collection Time    09/20/13  5:00 AM      Result Value Ref Range   Sodium 153 (*) 137 - 147 mEq/L   Potassium 3.6 (*) 3.7 - 5.3 mEq/L   Chloride 116 (*) 96 - 112 mEq/L   CO2 24  19 - 32 mEq/L   Glucose, Bld 125 (*) 70 - 99 mg/dL   BUN 32 (*) 6 - 23  mg/dL   Creatinine, Ser 1.79 (*) 0.50 - 1.35 mg/dL   Calcium 6.8 (*) 8.4 - 10.5 mg/dL   Total Protein 4.0 (*) 6.0 - 8.3 g/dL   Albumin 2.0 (*) 3.5 - 5.2 g/dL   AST 111 (*) 0 - 37 U/L   ALT 240 (*) 0 - 53 U/L   Alkaline Phosphatase 448 (*) 39 - 117 U/L   Total Bilirubin 1.3 (*) 0.3 - 1.2 mg/dL   GFR calc non Af Amer 37 (*) >90 mL/min   GFR calc Af Amer 43 (*) >90 mL/min   Comment: (NOTE)     The eGFR has been calculated using the CKD EPI equation.     This calculation has not been validated in all clinical situations.     eGFR's persistently <90 mL/min signify possible Chronic Kidney     Disease.   Anion gap 13  5 - 15  CBC     Status: Abnormal   Collection Time    09/20/13  5:00 AM      Result Value Ref Range   WBC 24.6 (*) 4.0 - 10.5 K/uL   RBC 3.67 (*) 4.22 - 5.81 MIL/uL   Hemoglobin 10.8 (*) 13.0 - 17.0 g/dL   HCT 32.4 (*) 39.0 - 52.0 %   MCV 88.3  78.0 - 100.0 fL   MCH 29.4  26.0 - 34.0 pg   MCHC 33.3  30.0 - 36.0 g/dL   RDW 15.1  11.5 - 15.5 %   Platelets 153  150 - 400 K/uL  GLUCOSE, CAPILLARY     Status: Abnormal   Collection  Time    09/20/13  8:00 AM      Result Value Ref Range   Glucose-Capillary 140 (*) 70 - 99 mg/dL  PROCALCITONIN     Status: None   Collection Time    09/20/13 12:15 PM      Result Value Ref Range   Procalcitonin 2.22     Comment:            Interpretation:     PCT > 2 ng/mL:     Systemic infection (sepsis) is likely,     unless other causes are known.     (NOTE)             ICU PCT Algorithm               Non ICU PCT Algorithm        ----------------------------     ------------------------------             PCT < 0.25 ng/mL                 PCT < 0.1 ng/mL         Stopping of antibiotics            Stopping of antibiotics           strongly encouraged.               strongly encouraged.        ----------------------------     ------------------------------           PCT level decrease by               PCT < 0.25 ng/mL           >= 80%  from peak PCT           OR PCT 0.25 - 0.5 ng/mL          Stopping of antibiotics                                                 encouraged.         Stopping of antibiotics               encouraged.        ----------------------------     ------------------------------           PCT level decrease by              PCT >= 0.25 ng/mL           < 80% from peak PCT            AND PCT >= 0.5 ng/mL            Continuing antibiotics                                                  encouraged.           Continuing antibiotics                encouraged.        ----------------------------     ------------------------------         PCT level increase compared  PCT > 0.5 ng/mL             with peak PCT AND              PCT >= 0.5 ng/mL             Escalation of antibiotics                                              strongly encouraged.          Escalation of antibiotics            strongly encouraged.  GLUCOSE, CAPILLARY     Status: Abnormal   Collection Time    09/20/13 12:50 PM      Result Value Ref Range   Glucose-Capillary 129 (*) 70 - 99 mg/dL  GLUCOSE, CAPILLARY     Status: None   Collection Time    09/20/13  3:37 PM      Result Value Ref Range   Glucose-Capillary 98  70 - 99 mg/dL  GLUCOSE, CAPILLARY     Status: Abnormal   Collection Time    09/20/13  7:12 PM      Result Value Ref Range   Glucose-Capillary 111 (*) 70 - 99 mg/dL  GLUCOSE, CAPILLARY     Status: Abnormal   Collection Time    09/21/13 12:07 AM      Result Value Ref Range   Glucose-Capillary 131 (*) 70 - 99 mg/dL  GLUCOSE, CAPILLARY     Status: Abnormal   Collection Time    09/21/13  4:12 AM      Result Value Ref Range   Glucose-Capillary 142 (*) 70 - 99 mg/dL   Comment 1 Documented in Chart     Comment 2 Notify RN    PROCALCITONIN     Status: None   Collection Time    09/21/13  4:20 AM      Result Value Ref Range   Procalcitonin 2.41     Comment:            Interpretation:     PCT > 2 ng/mL:      Systemic infection (sepsis) is likely,     unless other causes are known.     (NOTE)             ICU PCT Algorithm               Non ICU PCT Algorithm        ----------------------------     ------------------------------             PCT < 0.25 ng/mL                 PCT < 0.1 ng/mL         Stopping of antibiotics            Stopping of antibiotics           strongly encouraged.               strongly encouraged.        ----------------------------     ------------------------------           PCT level decrease by               PCT < 0.25 ng/mL           >=  80% from peak PCT           OR PCT 0.25 - 0.5 ng/mL          Stopping of antibiotics                                                 encouraged.         Stopping of antibiotics               encouraged.        ----------------------------     ------------------------------           PCT level decrease by              PCT >= 0.25 ng/mL           < 80% from peak PCT            AND PCT >= 0.5 ng/mL            Continuing antibiotics                                                  encouraged.           Continuing antibiotics                encouraged.        ----------------------------     ------------------------------         PCT level increase compared          PCT > 0.5 ng/mL             with peak PCT AND              PCT >= 0.5 ng/mL             Escalation of antibiotics                                              strongly encouraged.          Escalation of antibiotics            strongly encouraged.  CBC     Status: Abnormal   Collection Time    09/21/13  4:20 AM      Result Value Ref Range   WBC 23.0 (*) 4.0 - 10.5 K/uL   RBC 3.81 (*) 4.22 - 5.81 MIL/uL   Hemoglobin 11.3 (*) 13.0 - 17.0 g/dL   HCT 33.7 (*) 39.0 - 52.0 %   MCV 88.5  78.0 - 100.0 fL   MCH 29.7  26.0 - 34.0 pg   MCHC 33.5  30.0 - 36.0 g/dL   RDW 15.3  11.5 - 15.5 %   Platelets 127 (*) 150 - 400 K/uL  COMPREHENSIVE METABOLIC PANEL     Status: Abnormal    Collection Time    09/21/13  4:20 AM      Result Value Ref Range   Sodium 150 (*) 137 - 147 mEq/L   Potassium 3.5 (*) 3.7 - 5.3 mEq/L   Chloride 115 (*) 96 - 112 mEq/L  CO2 25  19 - 32 mEq/L   Glucose, Bld 166 (*) 70 - 99 mg/dL   BUN 33 (*) 6 - 23 mg/dL   Creatinine, Ser 1.99 (*) 0.50 - 1.35 mg/dL   Calcium 6.6 (*) 8.4 - 10.5 mg/dL   Total Protein 3.9 (*) 6.0 - 8.3 g/dL   Albumin 1.9 (*) 3.5 - 5.2 g/dL   AST 84 (*) 0 - 37 U/L   ALT 183 (*) 0 - 53 U/L   Alkaline Phosphatase 450 (*) 39 - 117 U/L   Total Bilirubin 1.0  0.3 - 1.2 mg/dL   GFR calc non Af Amer 33 (*) >90 mL/min   GFR calc Af Amer 38 (*) >90 mL/min   Comment: (NOTE)     The eGFR has been calculated using the CKD EPI equation.     This calculation has not been validated in all clinical situations.     eGFR's persistently <90 mL/min signify possible Chronic Kidney     Disease.   Anion gap 10  5 - 15  GLUCOSE, CAPILLARY     Status: Abnormal   Collection Time    09/21/13  7:46 AM      Result Value Ref Range   Glucose-Capillary 139 (*) 70 - 99 mg/dL  URINALYSIS, ROUTINE W REFLEX MICROSCOPIC     Status: Abnormal   Collection Time    09/21/13 11:41 AM      Result Value Ref Range   Color, Urine YELLOW  YELLOW   APPearance CLEAR  CLEAR   Specific Gravity, Urine 1.015  1.005 - 1.030   pH 6.0  5.0 - 8.0   Glucose, UA NEGATIVE  NEGATIVE mg/dL   Hgb urine dipstick MODERATE (*) NEGATIVE   Bilirubin Urine NEGATIVE  NEGATIVE   Ketones, ur NEGATIVE  NEGATIVE mg/dL   Protein, ur 100 (*) NEGATIVE mg/dL   Urobilinogen, UA 0.2  0.0 - 1.0 mg/dL   Nitrite NEGATIVE  NEGATIVE   Leukocytes, UA NEGATIVE  NEGATIVE  CULTURE, EXPECTORATED SPUTUM-ASSESSMENT     Status: None   Collection Time    09/21/13 11:41 AM      Result Value Ref Range   Specimen Description SPUTUM     Special Requests NONE     Sputum evaluation       Value: THIS SPECIMEN IS ACCEPTABLE. RESPIRATORY CULTURE REPORT TO FOLLOW.   Report Status 09/21/2013 FINAL      URINE MICROSCOPIC-ADD ON     Status: None   Collection Time    09/21/13 11:41 AM      Result Value Ref Range   WBC, UA 0-2  <3 WBC/hpf   RBC / HPF 0-2  <3 RBC/hpf   Bacteria, UA RARE  RARE   Urine-Other LESS THAN 10 mL OF URINE SUBMITTED     Comment: MICROSCOPIC EXAM PERFORMED ON UNCONCENTRATED URINE  GLUCOSE, CAPILLARY     Status: Abnormal   Collection Time    09/21/13 12:25 PM      Result Value Ref Range   Glucose-Capillary 112 (*) 70 - 99 mg/dL      Component Value Date/Time   SDES SPUTUM 09/21/2013 1141   SPECREQUEST NONE 09/21/2013 1141   CULT  Value: NO GROWTH 5 DAYS Performed at [facility] 09/15/2013 1030   REPTSTATUS 09/21/2013 FINAL 09/21/2013 1141   Ct Head Wo Contrast  09/19/2013   CLINICAL DATA:  Fever.  Seizure-like activity .  EXAM: CT HEAD WITHOUT CONTRAST  TECHNIQUE: Contiguous axial images were  obtained from the base of the skull through the vertex without intravenous contrast.  COMPARISON:  None.  FINDINGS: Motion artifact. No intra-axial or extra-axial pathologic fold blood collection. No mass lesion. No hydrocephalus. No hemorrhage. Orbits are unremarkable. Mucosal thickening noted in frontal and ethmoid sinuses consistent with mild sinusitis. No significant fluid collections in the mastoids. No acute bony abnormality.  IMPRESSION: 1. No acute intracranial abnormality. 2. Mild mucosal thickening noted in the frontal and ethmoidal sinuses consistent with mild sinusitis. Sinusitis may be chronic.   Electronically Signed   By: Marcello Moores  Register   On: 09/19/2013 18:29   Dg Chest Port 1 View  09/20/2013   CLINICAL DATA:  Central line placement.  EXAM: PORTABLE CHEST - 1 VIEW  COMPARISON:  09/20/2013  FINDINGS: Endotracheal tube tip measures 3 cm above the carinal. Interval placement of a right central venous catheter. Tip localizes over the cavoatrial junction. No pneumothorax. Shallow inspiration. Atelectasis in the left lung base. Normal heart size and pulmonary  vascularity.  IMPRESSION: Appliances appear in satisfactory position. No pneumothorax. Shallow inspiration with atelectasis in the left lung base.   Electronically Signed   By: Lucienne Capers M.D.   On: 09/20/2013 03:15   Dg Chest Port 1 View  09/20/2013   CLINICAL DATA:  Endotracheal tube placement. Attempted central line placement on left side.  EXAM: PORTABLE CHEST - 1 VIEW  COMPARISON:  09/15/2013  FINDINGS: Endotracheal tube placed with tip measuring 3 cm above the carina. Shallow inspiration. Heart size and pulmonary vascularity are normal. Linear atelectasis in the lung bases. No pneumothorax. No blunting of costophrenic angles.  IMPRESSION: Appliances appear in satisfactory location. No pneumothorax. Shallow inspiration with atelectasis in the lung bases.   Electronically Signed   By: Lucienne Capers M.D.   On: 09/20/2013 02:46   Recent Results (from the past 240 hour(s))  CULTURE, BLOOD (ROUTINE X 2)     Status: None   Collection Time    09/15/13 10:15 AM      Result Value Ref Range Status   Specimen Description BLOOD LEFT HAND   Final   Special Requests BOTTLES DRAWN AEROBIC AND ANAEROBIC 10CC   Final   Culture  Setup Time     Final   Value: 09/15/2013 14:27     Performed at [facility]   Culture     Final   Value: NO GROWTH 5 DAYS     Performed at [facility]   Report Status 09/21/2013 FINAL   Final  CULTURE, BLOOD (ROUTINE X 2)     Status: None   Collection Time    09/15/13 10:30 AM      Result Value Ref Range Status   Specimen Description BLOOD LEFT HAND   Final   Special Requests BOTTLES DRAWN AEROBIC AND ANAEROBIC 10CC   Final   Culture  Setup Time     Final   Value: 09/15/2013 14:28     Performed at [facility]   Culture     Final   Value: NO GROWTH 5 DAYS     Performed at [facility]   Report Status 09/21/2013 FINAL   Final  MRSA PCR SCREENING     Status: None   Collection Time    09/15/13  4:45 PM      Result Value Ref Range  Status   MRSA by PCR NEGATIVE  NEGATIVE Final   Comment:  The GeneXpert MRSA Assay (FDA     approved for NASAL specimens     only), is one component of a     comprehensive MRSA colonization     surveillance program. It is not     intended to diagnose MRSA     infection nor to guide or     monitor treatment for     MRSA infections.  CLOSTRIDIUM DIFFICILE BY PCR     Status: None   Collection Time    09/19/13  8:27 AM      Result Value Ref Range Status   C difficile by pcr NEGATIVE  NEGATIVE Final  CULTURE, EXPECTORATED SPUTUM-ASSESSMENT     Status: None   Collection Time    09/21/13 11:41 AM      Result Value Ref Range Status   Specimen Description SPUTUM   Final   Special Requests NONE   Final   Sputum evaluation     Final   Value: THIS SPECIMEN IS ACCEPTABLE. RESPIRATORY CULTURE REPORT TO FOLLOW.   Report Status 09/21/2013 FINAL   Final      09/21/2013, 1:51 PM     LOS: 6 days

## 2013-09-21 NOTE — Progress Notes (Signed)
NEURO HOSPITALIST PROGRESS NOTE   SUBJECTIVE:                                                                                                                        Stated that he feels " very cold" but offers no neurological complains. Febrile with a high white count but without evident source of infection. Antibiotics stopped. No further seizure like activity noted. Leukocytosis 23,000.   OBJECTIVE:                                                                                                                           Vital signs in last 24 hours: Temp:  [100.1 F (37.8 C)-103 F (39.4 C)] 101.5 F (38.6 C) (09/10 0600) Pulse Rate:  [65-139] 139 (09/10 1100) Resp:  [17-41] 21 (09/10 1100) BP: (89-141)/(50-116) 100/69 mmHg (09/10 1100) SpO2:  [95 %-100 %] 100 % (09/10 1100)  Intake/Output from previous day: 09/09 0701 - 09/10 0700 In: 2000.1 [I.V.:1937.6; IV Piggyback:62.5] Out: 2120 [Urine:1470; Stool:650] Intake/Output this shift: Total I/O In: 430.2 [I.V.:430.2] Out: 265 [Urine:265] Nutritional status:    Past Medical History  Diagnosis Date  . Hypertension   . Diabetes mellitus without complication   . Cancer     Neurologic Exam:  General: Mental Status: Alert, oriented, thought content appropriate.  Speech fluent without evidence of aphasia.  Able to follow 3 step commands without difficulty. Cranial Nerves: II: Discs flat bilaterally; Visual fields grossly normal, pupils equal, round, reactive to light and accommodation III,IV, VI: ptosis not present, extra-ocular motions intact bilaterally V,VII: smile symmetric, facial light touch sensation normal bilaterally VIII: hearing normal bilaterally IX,X: gag reflex present XI: bilateral shoulder shrug XII: midline tongue extension without atrophy or fasciculations  Motor: Right : Upper extremity   5/5    Left:     Upper extremity   5/5  Lower extremity   5/5     Lower  extremity   5/5 Tone and bulk:normal tone throughout; no atrophy noted Sensory: Pinprick and light touch intact throughout, bilaterally Deep Tendon Reflexes:  Right: Upper Extremity   Left: Upper extremity   biceps (C-5 to C-6) 2/4   biceps (C-5 to C-6) 2/4 tricep (C7) 2/4    triceps (  C7) 2/4 Brachioradialis (C6) 2/4  Brachioradialis (C6) 2/4  Lower Extremity Lower Extremity  quadriceps (L-2 to L-4) 2/4   quadriceps (L-2 to L-4) 2/4 Achilles (S1) 2/4   Achilles (S1) 2/4  Plantars: Right: downgoing   Left: downgoing Cerebellar: normal finger-to-nose,  normal heel-to-shin test Gait:  No tested CV: pulses palpable throughout    Lab Results: No results found for this basename: cbc, bmp, coags, chol, tri, ldl, hga1c   Lipid Panel No results found for this basename: CHOL, TRIG, HDL, CHOLHDL, VLDL, LDLCALC,  in the last 72 hours  Studies/Results: Ct Head Wo Contrast  09/19/2013   CLINICAL DATA:  Fever.  Seizure-like activity .  EXAM: CT HEAD WITHOUT CONTRAST  TECHNIQUE: Contiguous axial images were obtained from the base of the skull through the vertex without intravenous contrast.  COMPARISON:  None.  FINDINGS: Motion artifact. No intra-axial or extra-axial pathologic fold blood collection. No mass lesion. No hydrocephalus. No hemorrhage. Orbits are unremarkable. Mucosal thickening noted in frontal and ethmoid sinuses consistent with mild sinusitis. No significant fluid collections in the mastoids. No acute bony abnormality.  IMPRESSION: 1. No acute intracranial abnormality. 2. Mild mucosal thickening noted in the frontal and ethmoidal sinuses consistent with mild sinusitis. Sinusitis may be chronic.   Electronically Signed   By: Marcello Moores  Register   On: 09/19/2013 18:29   Dg Chest Port 1 View  09/20/2013   CLINICAL DATA:  Central line placement.  EXAM: PORTABLE CHEST - 1 VIEW  COMPARISON:  09/20/2013  FINDINGS: Endotracheal tube tip measures 3 cm above the carinal. Interval placement of a  right central venous catheter. Tip localizes over the cavoatrial junction. No pneumothorax. Shallow inspiration. Atelectasis in the left lung base. Normal heart size and pulmonary vascularity.  IMPRESSION: Appliances appear in satisfactory position. No pneumothorax. Shallow inspiration with atelectasis in the left lung base.   Electronically Signed   By: Lucienne Capers M.D.   On: 09/20/2013 03:15   Dg Chest Port 1 View  09/20/2013   CLINICAL DATA:  Endotracheal tube placement. Attempted central line placement on left side.  EXAM: PORTABLE CHEST - 1 VIEW  COMPARISON:  09/15/2013  FINDINGS: Endotracheal tube placed with tip measuring 3 cm above the carina. Shallow inspiration. Heart size and pulmonary vascularity are normal. Linear atelectasis in the lung bases. No pneumothorax. No blunting of costophrenic angles.  IMPRESSION: Appliances appear in satisfactory location. No pneumothorax. Shallow inspiration with atelectasis in the lung bases.   Electronically Signed   By: Lucienne Capers M.D.   On: 09/20/2013 02:46    MEDICATIONS                                                                                                                        Scheduled: . antiseptic oral rinse  7 mL Mouth Rinse BID  . enoxaparin (LOVENOX) injection  40 mg Subcutaneous Q24H  . insulin aspart  0-15 Units Subcutaneous 6 times per day    ASSESSMENT/PLAN:  68 y/o admitted to [facility] with acute confusional state and concerns for seizure. His mental state is quite appropriate at this moment, and he has not lateralizing signs but fever with leukocytosis, encephalopathy and possible seizure on admission concerning for CNS infection and thus suggest LP. MRI brain if feasible. Will follow up.     Dorian Pod, MD [facility] [phone]  09/21/2013, 12:24 PM

## 2013-09-21 NOTE — Procedures (Signed)
Lumbar Puncture Procedure Note  Pre-operative Diagnosis: fever unclear etiology, r/o enceph  Post-operative Diagnosis: r/o enceph  Indications: Diagnostic  Procedure Details   Consent: Informed consent was obtained. Risks of the procedure were discussed including: infection, bleeding, pain and headache.  The patient was positioned under sterile conditions.Chrloraprep  and sterile drapes were utilized. A spinal needle was inserted at the L3-L4 interspace.  Spinal fluid was obtained and sent to the laboratory.  Findings 6 mL of clear spinal fluid was obtained. Opening Pressure: 7 cm H2O pressure. Closing Pressure: wnl cm H2O pressure.  Complications:  None; patient tolerated the procedure well.        Condition: stable  Plan Bed rest for 5 hours. Tylenol 650 mg for pain.   Clean tap , no blood  Lavon Paganini. Titus Mould, MD, [place] Pgr: [facility]

## 2013-09-21 NOTE — Progress Notes (Signed)
[facility] Progress Note Patient Name: William Stark DOB: 10/15/1945 MRN: [id number]   Date of Service  09/21/2013  HPI/Events of Note  Venous Doppler LE >>> preliminary - thrombus L posterior tibial vein. Recent LP by DF  eICU Interventions  Discussed with DF Heparin gtt, NO bolus, start 21:00 ( 4 hours after LP )     Intervention Category Intermediate Interventions: Other:  Dung Salinger 09/21/2013, 6:18 PM

## 2013-09-21 NOTE — Care Management Note (Signed)
    Page 1 of 1   09/21/2013     12:00:04 PM CARE MANAGEMENT NOTE 09/21/2013  Patient:  William Stark, William Stark   Account Number:  [id number]  Date Initiated:  09/18/2013  Documentation initiated by:  Ricki Miller  Subjective/Objective Assessment:   68 yr old male admitted with Severe sepsis.     Action/Plan:   Anticipated DC Date:  09/27/2013   Anticipated DC Plan:  [place]  CM consult      Choice offered to / List presented to:             Status of service:  In process, will continue to follow Medicare Important Message given?  YES (If response is "NO", the following Medicare IM given date fields will be blank) Date Medicare IM given:  09/18/2013 Medicare IM given by:  Ricki Miller Date Additional Medicare IM given:  09/21/2013 Additional Medicare IM given by:  [place]  Discharge Disposition:    Per UR Regulation:  Reviewed for med. necessity/level of care/duration of stay  If discussed at [place] of Stay Meetings, dates discussed:    Comments:  ContactTremel, Setters Spouse [phone]  09-21-13 12noon - Luz Lex, RNBSN [id number] Extubated - continues with temp  - unknown source. Multiple testing for source.  ID consulting.  09-20-13 - 8:30am Luz Lex, RNBSN [phone] Agitation - twitching.  Moved to ICU - precedex drip and intubated.  Neuro plan for LP.

## 2013-09-21 NOTE — Progress Notes (Signed)
*  Preliminary Results* Bilateral lower extremity venous duplex completed. The right lower extremity is negative for deep vein thrombosis. The left lower extremity is positive for deep vein thrombosis involving the left posterior tibial veins. There is no evidence of Baker's cyst bilaterally.  09/21/2013  Maudry Mayhew, RVT, RDCS, RDMS

## 2013-09-21 NOTE — Progress Notes (Addendum)
ANTICOAGULATION / ANTIBIOTIC CONSULT NOTE - Initial Consult  Pharmacy Consult:  Heparin / Acyclovir Indication:  New LLE DVT / CNS infection  No Known Allergies  Patient Measurements: Height: 6' (182.9 cm) Weight: 197 lb 8.5 oz (89.6 kg) IBW/kg (Calculated) : 77.6 Heparin Dosing Weight: 90 kg  Vital Signs: Temp: 98.8 F (37.1 C) (09/10 1500) Temp src: Oral (09/10 1500) BP: 112/77 mmHg (09/10 1415) Pulse Rate: 116 (09/10 1400)  Labs:  Recent Labs  09/19/13 1539 09/19/13 2300 09/20/13 0500 09/21/13 0420  HGB  --  10.9* 10.8* 11.3*  HCT  --  32.4* 32.4* 33.7*  PLT  --  154 153 127*  LABPROT 17.1* 17.1*  --   --   INR 1.39 1.39  --   --   CREATININE  --  1.83* 1.79* 1.99*  TROPONINI  --  <0.30  --   --     Estimated Creatinine Clearance: 39 ml/min (by C-G formula based on Cr of 1.99).   Medical History: Past Medical History  Diagnosis Date  . Hypertension   . Diabetes mellitus without complication   . Cancer       Assessment: 9 YOM known to pharmacy from previous antibiotics dosing.  Patient to start IV heparin tonight for new LLE DVT.  He is s/p LP this afternoon and MD specified to start IV heparin at 2100 without a bolus.  Aware SQ Lovenox has been discontinued, patient's LFTs are elevated, and his platelet count is slightly low.   Goal of Therapy:  Heparin level 0.3-0.7 units/ml Monitor platelets by anticoagulation protocol: Yes    Plan:  - At 2100 tonight, start IV heparin gtt at 1200 units/hr, no bolus - Check 8 hr HL to ensure Lovenox is completely cleared - Daily HL / CBC - Monitor closely for s/sx of bleeding - F/U start oral AC when possible    Felica Chargois D. Mina Marble, PharmD, BCPS Pager:  [phone] 09/21/2013, 5:39 PM    ==========================================   Addendum: - patient to start acyclovir for CNS infection - renal impairment  Vanc 9/4 >> 9/8 Zosyn 9/4 >>  9/9 Acyclovir 9/10 >>  9/4 Bld. x 2 - neg 9/8 Cdiff - neg 9/9  Stool cx -  9/10 CSF cx -  9/10 BCx x2 -    Plan: - Acyclovir 775mg  IV Q12H (~10 mg/kg/dose based on IBW) - Monitor renal fxn, clinical course - F/U LP result    Keiaira Donlan D. Mina Marble, PharmD, BCPS Pager:  [phone] 09/21/2013, 8:11 PM

## 2013-09-21 NOTE — Progress Notes (Signed)
PULMONARY / CRITICAL CARE MEDICINE  Name: William Stark MRN: [id number] DOB: 03-Sep-1945    ADMISSION DATE:  09/15/2013 CONSULTATION DATE:  09/15/2013  REFERRING MD :  Zada Girt   CHIEF COMPLAINT:  Hypotension and renal failure   INITIAL PRESENTATION: 68 y/o presented 9/4 with altered mental status, hypovolemia and acute renal failure Cr 10.72, baseline 1.4). Started on Vanc/Zosyn for suspected cellulitis  STUDIES / EVENTS:  90/4  Renal US: no hydronephrosis 9/04 Renal Consult: AKI due to hypovolemic shock, worsened on an ACE-I and NSAIDS . Metabolic acidosis with elevated lactate. Sodium bicarbonate initiated 9/05 - 9/08 Renal function improving. Cr  9/06 Low grade fever 9/07 - 9/08 increasing fever and confusion 9/08 Facial twitching per RN. Concern for seizure 9/08 EEG: This EEG could be consistent with a moderate nonspecific generalized cerebral dysfunction with superimposed medication effect in the form of beta activity secondary previously administered Ativan. There is a possibility that the recurrent bursts of activity could be epileptiform in nature, but this is not definite by this study. Repeat study with Ativan administration may be helpful to differentiate 9/08 HIV negative 9/08 PM: Worsening AMS/agitation - improved with dexmedetomidine. Intubated for "respiratory distress".  9/08 CT head: NAD 9/09 early AM: Neurology consult - recommended repeat EEG with lorazepam administration and MRI brain 9/09 Passed SBT. Extubated. Encephalopathic. Asterixis noted. ? Delusional thinking. PRN Haldol ordered 9/10 Tolerating extubation. Cooperative. Oriented but personality is slightly bizarre (unclear if baseline). Occasional facial twitching. No convulsive episodes. Fevers persist. ID consult requested for fever of unclear etiology.  9/10 LE venous duplex:  9/10 MRI brain:  9/10 LP ([facility]instein):   Lines/Tubes: R IJ CVL 9/04 >>  ETT 9/08 >> 9/09  Microbiology: MRSA PCR 9/04 >>  NEG HIV 9/08 >> nonreactive Blood 9/04 >> NEG C diff 9/08 >> NEG Stool cx 9/09 >>  Urine 9/10 >>  Resp 9/10 >>  Blood 9/10 >>  RMSF IgG, IgM 9/10 >>  Ehrlichia ab 9/10 >>  HIV quant 9/10 >>  RPR 9/10 >>   PCT 9/09: 2.22,  9/10: 2.41,   9/11:    Antibiotics:  Vanc 9/04 >> 9/09 Zosyn 9/04 >> 9/09   SUBJ:   Tolerating extubation. Cooperative. Oriented but personality is slightly bizarre (unclear if baseline). Occasional facial twitching. No convulsive episodes. Fevers persist. ID consult requested for fever of unclear etiology.   VITAL SIGNS: Temp:  [99.6 F (37.6 C)-103 F (39.4 C)] 100.7 F (38.2 C) (09/10 1400) Pulse Rate:  [65-139] 116 (09/10 1400) Resp:  [17-41] 21 (09/10 1130) BP: (89-141)/(50-116) 112/77 mmHg (09/10 1415) SpO2:  [68 %-100 %] 100 % (09/10 1400)  INTAKE / OUTPUT: Intake/Output     09/09 0701 - 09/10 0700 09/10 0701 - 09/11 0700   I.V. (mL/kg) 1937.6 (21.6) 685.2 (7.6)   IV Piggyback 62.5    Total Intake(mL/kg) 2000.1 (22.3) 685.2 (7.6)   Urine (mL/kg/hr) 1470 (0.7) 370 (0.5)   Stool 650 (0.3) 200 (0.3)   Total Output 2120 570   Net -120 +115.2         PHYSICAL EXAMINATION: General:  Cooperative, NAD Neuro:  Occasional facial twitching, RASS 0. + F/C. Asterixis improved. MAEs HEENT: WNL Cardiovascular:  Reg, no M Lungs: clear Abdomen:  Soft, +BS Ext: 1-2+ R>L LE edema  LABS: I have reviewed all of today's lab results. Relevant abnormalities are discussed in the A/P section  CXR: NACPD  ASSESSMENT: Acute respiratory failure, resolved Sinus tachycardia due to fever AKI, nonoliguric - Cr  rising.  Persistent hypernatremia Metabolic acidosis, resolved ? LE Cellulitis - resolved High fever, unclear etiology Chronic persistent diarrhea Erythema - generalized, resolved Acute encephalopathy, improving to resolved Possible seizure 9/09 - none further Elevated LFTs, transaminases improving. Alk phos remains elevated. Abd exam  unremarkable DM II, controlled Heavy smoker PTA  Plan: Supp O2 as needed to maintain SpO2 > 92% BDs as needed Monitor hemodynamics Monitor BMET intermittently Monitor I/Os Correct electrolytes as indicated Increase free water (D5W) Advance diet Monitor LFTs intermittently Neuro following - MRI, LP planned ID consult reviewed Called to ensure that venous duplex studies ordered 9/09 are performed today  40 mins CCM time  Merton Border, MD ; [facility] service Mobile [phone].  After 5:30 PM or weekends, call [phone]

## 2013-09-21 NOTE — Significant Event (Signed)
CRITICAL VALUE ALERT  Critical value received:  Spinal Fluid Tube #1 WBC 19 Tube #4 WBC 20   Date of notification:  09/21/13   Time of notification:  2000  Critical value read back: yes   Nurse who received alert: Allene Pyo   MD notified (1st page):  Dr. Oliver Pila  Time of first page:  2000  MD notified (2nd page):  Time of second page:  Responding MD:  Dr. Oliver Pila  Time MD responded: 2000

## 2013-09-22 ENCOUNTER — Inpatient Hospital Stay (HOSPITAL_COMMUNITY): Payer: Medicare Other

## 2013-09-22 DIAGNOSIS — D696 Thrombocytopenia, unspecified: Secondary | ICD-10-CM

## 2013-09-22 DIAGNOSIS — D72829 Elevated white blood cell count, unspecified: Secondary | ICD-10-CM

## 2013-09-22 DIAGNOSIS — R509 Fever, unspecified: Secondary | ICD-10-CM

## 2013-09-22 DIAGNOSIS — I82409 Acute embolism and thrombosis of unspecified deep veins of unspecified lower extremity: Secondary | ICD-10-CM

## 2013-09-22 LAB — APTT
APTT: 125 s — AB (ref 24–37)
aPTT: 197 seconds — ABNORMAL HIGH (ref 24–37)
aPTT: 76 seconds — ABNORMAL HIGH (ref 24–37)

## 2013-09-22 LAB — GLUCOSE, CAPILLARY
GLUCOSE-CAPILLARY: 148 mg/dL — AB (ref 70–99)
Glucose-Capillary: 211 mg/dL — ABNORMAL HIGH (ref 70–99)
Glucose-Capillary: 90 mg/dL (ref 70–99)
Glucose-Capillary: 103 mg/dL — ABNORMAL HIGH (ref 70–99)

## 2013-09-22 LAB — RPR

## 2013-09-22 LAB — HERPES SIMPLEX VIRUS(HSV) DNA BY PCR
HSV 1 DNA: NOT DETECTED
HSV 2 DNA: NOT DETECTED

## 2013-09-22 LAB — COMPREHENSIVE METABOLIC PANEL
ALT: 138 U/L — AB (ref 0–53)
AST: 93 U/L — ABNORMAL HIGH (ref 0–37)
Albumin: 1.7 g/dL — ABNORMAL LOW (ref 3.5–5.2)
Alkaline Phosphatase: 343 U/L — ABNORMAL HIGH (ref 39–117)
Anion gap: 9 (ref 5–15)
BUN: 29 mg/dL — AB (ref 6–23)
CALCIUM: 6.5 mg/dL — AB (ref 8.4–10.5)
CO2: 23 meq/L (ref 19–32)
Chloride: 110 mEq/L (ref 96–112)
Creatinine, Ser: 2.01 mg/dL — ABNORMAL HIGH (ref 0.50–1.35)
GFR, EST AFRICAN AMERICAN: 38 mL/min — AB (ref >90)
GFR, EST NON AFRICAN AMERICAN: 32 mL/min — AB (ref >90)
GLUCOSE: 228 mg/dL — AB (ref 70–99)
Potassium: 3.9 mEq/L (ref 3.7–5.3)
Sodium: 142 mEq/L (ref 137–147)
Total Bilirubin: 0.8 mg/dL (ref 0.3–1.2)
Total Protein: 3.5 g/dL — ABNORMAL LOW (ref 6.0–8.3)

## 2013-09-22 LAB — CBC
HEMATOCRIT: 29.2 % — AB (ref 39.0–52.0)
HEMOGLOBIN: 9.7 g/dL — AB (ref 13.0–17.0)
MCH: 29.9 pg (ref 26.0–34.0)
MCHC: 33.2 g/dL (ref 30.0–36.0)
MCV: 90.1 fL (ref 78.0–100.0)
Platelets: 84 10*3/uL — ABNORMAL LOW (ref 150–400)
RBC: 3.24 MIL/uL — AB (ref 4.22–5.81)
RDW: 15.2 % (ref 11.5–15.5)
WBC: 18.8 10*3/uL — ABNORMAL HIGH (ref 4.0–10.5)

## 2013-09-22 LAB — ROCKY MTN SPOTTED FVR AB, IGG-BLOOD: RMSF IgG: 0.17 IV

## 2013-09-22 LAB — HIV-1 RNA QUANT-NO REFLEX-BLD
HIV 1 RNA Quant: <20 copies/mL (ref <20)
HIV-1 RNA Quant, Log: <1.3 {Log} (ref <1.30)

## 2013-09-22 LAB — PROCALCITONIN: PROCALCITONIN: 2.16 ng/mL

## 2013-09-22 LAB — PATHOLOGIST SMEAR REVIEW

## 2013-09-22 LAB — ROCKY MTN SPOTTED FVR AB, IGM-BLOOD: RMSF IgM: 0.09 IV (ref 0.00–0.89)

## 2013-09-22 LAB — ANA: ANA: NEGATIVE

## 2013-09-22 LAB — HEPARIN LEVEL (UNFRACTIONATED): HEPARIN UNFRACTIONATED: 0.62 [IU]/mL (ref 0.30–0.70)

## 2013-09-22 LAB — ANCA SCREEN W REFLEX TITER
Atypical p-ANCA Screen: NEGATIVE
C-ANCA SCREEN: NEGATIVE
P-ANCA SCREEN: NEGATIVE

## 2013-09-22 MED ORDER — CETYLPYRIDINIUM CHLORIDE 0.05 % MT LIQD
7.0000 mL | Freq: Two times a day (BID) | OROMUCOSAL | Status: DC
  Administered 2013-09-22 – 2013-09-28 (×10): 7 mL via OROMUCOSAL

## 2013-09-22 MED ORDER — INSULIN GLARGINE 100 UNIT/ML ~~LOC~~ SOLN
10.0000 [IU] | Freq: Every day | SUBCUTANEOUS | Status: DC
  Administered 2013-09-22 – 2013-09-25 (×4): 10 [IU] via SUBCUTANEOUS
  Filled 2013-09-22 (×5): qty 0.1

## 2013-09-22 MED ORDER — SODIUM CHLORIDE 0.9 % IV SOLN
0.0500 mg/kg/h | INTRAVENOUS | Status: DC
  Administered 2013-09-24: 0.05 mg/kg/h via INTRAVENOUS
  Filled 2013-09-22 (×2): qty 250

## 2013-09-22 MED ORDER — SODIUM CHLORIDE 0.9 % IV SOLN
0.1000 mg/kg/h | INTRAVENOUS | Status: DC
  Administered 2013-09-22: 0.1 mg/kg/h via INTRAVENOUS
  Filled 2013-09-22: qty 250

## 2013-09-22 MED ORDER — POTASSIUM CL IN DEXTROSE 5% 20 MEQ/L IV SOLN
20.0000 meq | INTRAVENOUS | Status: DC
  Administered 2013-09-22 – 2013-09-25 (×4): 20 meq via INTRAVENOUS
  Filled 2013-09-22 (×6): qty 1000

## 2013-09-22 MED ORDER — SODIUM CHLORIDE 0.9 % IJ SOLN
10.0000 mL | INTRAMUSCULAR | Status: DC | PRN
  Administered 2013-09-22: 20 mL
  Administered 2013-09-23 – 2013-09-24 (×2): 10 mL
  Administered 2013-09-25: 20 mL
  Administered 2013-09-27 – 2013-09-28 (×2): 10 mL

## 2013-09-22 MED ORDER — METOPROLOL TARTRATE 1 MG/ML IV SOLN
2.5000 mg | INTRAVENOUS | Status: DC | PRN
  Administered 2013-09-22: 2.5 mg via INTRAVENOUS
  Filled 2013-09-22: qty 5

## 2013-09-22 NOTE — Progress Notes (Signed)
ANTICOAGULATION CONSULT NOTE - Follow Up Consult  Pharmacy Consult for bivalirudin Indication: r/o HIT  No Known Allergies  Patient Measurements: Height: 6' (182.9 cm) Weight: 197 lb 8.5 oz (89.6 kg) IBW/kg (Calculated) : 77.6   Vital Signs: Temp: 98.9 F (37.2 C) (09/11 0824) Temp src: Oral (09/11 0824) BP: 98/64 mmHg (09/11 0800) Pulse Rate: 110 (09/11 0800)  Labs:  Recent Labs  09/19/13 1539  09/19/13 2300 09/20/13 0500 09/21/13 0420 09/22/13 0430  HGB  --   < > 10.9* 10.8* 11.3* 9.7*  HCT  --   < > 32.4* 32.4* 33.7* 29.2*  PLT  --   < > 154 153 127* 84*  LABPROT 17.1*  --  17.1*  --   --   --   INR 1.39  --  1.39  --   --   --   HEPARINUNFRC  --   --   --   --   --  0.62  CREATININE  --   < > 1.83* 1.79* 1.99* 2.01*  TROPONINI  --   --  <0.30  --   --   --   < > = values in this interval not displayed.  Estimated Creatinine Clearance: 38.6 ml/min (by C-G formula based on Cr of 2.01).   Medications:  Scheduled:  . acyclovir  10 mg/kg (Ideal) Intravenous Q12H  . antiseptic oral rinse  7 mL Mouth Rinse BID  . insulin aspart  0-15 Units Subcutaneous TID WC  . insulin aspart  0-5 Units Subcutaneous QHS  . insulin aspart  3 Units Subcutaneous TID WC    Assessment: 68 yo male on heparin for new LLE DVT on heparin. Platelet count is down to 84 heparin sq started on 9/4 (platelet count at that time was 242).   With new DVT and platelet frop for r/o HIT but other causes are also present.  AST/ALT= 93/138 (trend down), SCr= 1.01 and CrCl ~40. A HIT panel has been ordered. Pharmacy has been consulted to dose bivalirudin.  Goal of Therapy:  APTT= 50-85 Monitor platelets by anticoagulation protocol: Yes   Plan:  -Start bivalirudin at 0.1 mg/kg/hr -aPTT in 2 hours and daily -Will follow HIT panel results  Hildred Laser, Pharm D 09/22/2013 9:01 AM

## 2013-09-22 NOTE — Progress Notes (Signed)
ANTICOAGULATION CONSULT NOTE - Follow Up Consult  Pharmacy Consult for heparin Indication: DVT  Labs:  Recent Labs  09/19/13 1539 09/19/13 2300 09/20/13 0500 09/21/13 0420 09/22/13 0430  HGB  --  10.9* 10.8* 11.3* 9.7*  HCT  --  32.4* 32.4* 33.7* 29.2*  PLT  --  154 153 127* PENDING  LABPROT 17.1* 17.1*  --   --   --   INR 1.39 1.39  --   --   --   HEPARINUNFRC  --   --   --   --  0.62  CREATININE  --  1.83* 1.79* 1.99* 2.01*  TROPONINI  --  <0.30  --   --   --     Assessment/Plan:  68yo male therapeutic on heparin with initial dosing for DVT. Will continue gtt at current rate and confirm stable with additional level.   Wynona Neat, PharmD, BCPS  09/22/2013,5:52 AM

## 2013-09-22 NOTE — Progress Notes (Signed)
PULMONARY / CRITICAL CARE MEDICINE  Name: William Stark MRN: [id number] DOB: 1945-12-15    ADMISSION DATE:  09/15/2013 CONSULTATION DATE:  09/15/2013  REFERRING MD :  Alice Rieger   CHIEF COMPLAINT:  Hypotension and renal failure   INITIAL PRESENTATION: 68 y/o presented 9/4 with altered mental status, hypovolemia and acute renal failure Cr 10.72, baseline 1.4. Started on Vanc/Zosyn for suspected cellulitis  STUDIES / EVENTS:  9/04  Renal US: no hydronephrosis 9/04 Renal Consult: AKI due to hypovolemic shock, worsened on an ACE-I and NSAIDS . Metabolic acidosis with elevated lactate. Sodium bicarbonate initiated 9/05 - 9/08 Renal function improving. Cr  9/06 Low grade fever 9/07 - 9/08 increasing fever and confusion 9/08 Facial twitching per RN. Concern for seizure 9/08 EEG: This EEG could be consistent with a moderate nonspecific generalized cerebral dysfunction with superimposed medication effect in the form of beta activity secondary previously administered Ativan. There is a possibility that the recurrent bursts of activity could be epileptiform in nature, but this is not definite by this study. Repeat study with Ativan administration may be helpful to differentiate 9/08 HIV negative 9/08 PM: Worsening AMS/agitation - improved with dexmedetomidine. Intubated for "respiratory distress".  9/08 CT head: NAD 9/09 early AM: Neurology consult - recommended repeat EEG with lorazepam administration and MRI brain 9/09 Passed SBT. Extubated. Encephalopathic. Asterixis noted. ? Delusional thinking. PRN Haldol ordered 9/10 Tolerating extubation. Cooperative. Oriented but personality is slightly bizarre (unclear if baseline). Occasional facial twitching. No convulsive episodes. Fevers persist. ID consult requested for fever of unclear etiology.  9/10 LE venous duplex: consistent with acute deep vein thrombosis involving the left posterial tibial vein 9/10 MRI brain: NAD 9/10 LP ([facility]instein): 20 WBC,  96% lymphs, protein 95, glu 65, GS negative 9/11 Transfer to med-surg ordered. TRH to assume care as of AM 9/12 and PCCM to sign off  Lines/Tubes: R IJ CVL 9/04 >> (left in due to poor peripheral targets) ETT 9/08 >> 9/09  Microbiology: MRSA PCR 9/04 >> NEG HIV 9/08 >> nonreactive Blood 9/04 >> NEG C diff 9/08 >> NEG Stool cx 9/09 >> NEG  Resp 9/10 >>  Blood 9/10 >>  CSF 9/10 >>  CSF HSV Ag 9/10 >>  RMSF IgG, IgM 7/34 >>  Ehrlichia ab 2/87 >>  HIV quant 9/10 >>  RPR 9/10 >>   PCT 9/09: 2.22,  9/10: 2.41,   9/11:    Antibiotics:  Vanc 9/04 >> 9/09 Zosyn 9/04 >> 9/09 Acyclovir 9/10 >>   SUBJ:   No distress. Remains moderately confused and poorly oriented. Cooperative  VITAL SIGNS: Temp:  [98.8 F (37.1 C)-100.1 F (37.8 C)] 98.8 F (37.1 C) (09/11 1240) Pulse Rate:  [102-120] 118 (09/11 1400) Resp:  [20-40] 40 (09/11 1400) BP: (79-111)/(49-73) 90/64 mmHg (09/11 1400) SpO2:  [80 %-100 %] 96 % (09/11 1400)  INTAKE / OUTPUT: Intake/Output     09/10 0701 - 09/11 0700 09/11 0701 - 09/12 0700   P.O. 300 440   I.V. (mL/kg) 2855 (31.9) 391.7 (4.4)   IV Piggyback 165.5 165.5   Total Intake(mL/kg) 3320.5 (37.1) 997.2 (11.1)   Urine (mL/kg/hr) 990 (0.5) 245 (0.4)   Stool 575 (0.3)    Total Output 1565 245   Net +1755.5 +752.2         PHYSICAL EXAMINATION: General:  Cooperative, NAD Neuro:  RASS 0. + F/C. Mild asterixis. MAEs HEENT: WNL Cardiovascular:  Reg, no M Lungs: clear Abdomen:  Soft, +BS Ext: 1-2+ R>L LE edema  LABS: I have reviewed all of today's lab results. 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 Abd exam unremarkable DM II, controlled New finding of LLE DVT 9/10 - heparin initiated 9/10 Thrombocytopenia - heparin changed to bivalirudin 9/11  HIT panel sent  Initiate warfarin therapy if panel negative or when plts > 150k Heavy smoker PTA  Plan: Supp O2 as needed to maintain SpO2 > 92% BDs as needed Monitor hemodynamics Monitor BMET intermittently Monitor I/Os Correct electrolytes as indicated Decrease (but cont) free water (D5W) Cont diet Monitor LFTs intermittently Neuro following  ID following Cont acyclovir for now pending HSV Ag result Cont bivalirudin. If HIT +, transition to warfarin once plt > 150 k. If HIT -, may start warfarin anytime  Pharmacy helping manage  Transfer to [facility] 9/11 ordered. TRH to assume 9/12 and PCCM to sign off.    Merton Border, MD ; [facility] [phone].  After 5:30 PM or weekends, call [phone]

## 2013-09-22 NOTE — Progress Notes (Signed)
INFECTIOUS DISEASE PROGRESS NOTE  ID: William Stark is a 68 y.o. male with  Principal Problem:   Severe sepsis Active Problems:   Hypovolemic shock   Diabetes 1.5, managed as type 2   Acute renal injury   Hyperkalemia   Elevated liver enzymes   Increased anion gap metabolic acidosis   Hypoglycemia   Cellulitis of right foot   Acute renal failure   Acute encephalopathy   Sepsis   Encephalopathy acute   Asterixis   Intermittent fever of unknown origin  Subjective: Awakens easily.   Abtx:  Anti-infectives   Start     Dose/Rate Route Frequency Ordered Stop   09/21/13 2100  acyclovir (ZOVIRAX) 775 mg in dextrose 5 % 150 mL IVPB     10 mg/kg  77.6 kg (Ideal) 165.5 mL/hr over 60 Minutes Intravenous Every 12 hours 09/21/13 2011     09/19/13 2200  vancomycin (VANCOCIN) IVPB 750 mg/150 ml premix  Status:  Discontinued     750 mg 150 mL/hr over 60 Minutes Intravenous Every 12 hours 09/19/13 1043 09/19/13 2202   09/19/13 1000  vancomycin (VANCOCIN) IVPB 1000 mg/200 mL premix  Status:  Discontinued     1,000 mg 200 mL/hr over 60 Minutes Intravenous Every 24 hours 09/18/13 1216 09/19/13 1043   09/17/13 1100  piperacillin-tazobactam (ZOSYN) IVPB 3.375 g  Status:  Discontinued     3.375 g 12.5 mL/hr over 240 Minutes Intravenous 3 times per day 09/17/13 0922 09/20/13 1217   09/17/13 1000  vancomycin (VANCOCIN) IVPB 750 mg/150 ml premix  Status:  Discontinued     750 mg 150 mL/hr over 60 Minutes Intravenous Every 24 hours 09/17/13 0922 09/18/13 1216   09/15/13 1700  piperacillin-tazobactam (ZOSYN) IVPB 2.25 g  Status:  Discontinued     2.25 g 100 mL/hr over 30 Minutes Intravenous Every 6 hours 09/15/13 1354 09/17/13 0922   09/15/13 1000  vancomycin (VANCOCIN) 1,500 mg in sodium chloride 0.9 % 500 mL IVPB     1,500 mg 250 mL/hr over 120 Minutes Intravenous  Once 09/15/13 0959 09/15/13 1245   09/15/13 1000  piperacillin-tazobactam (ZOSYN) IVPB 3.375 g     3.375 g 100 mL/hr  over 30 Minutes Intravenous  Once 09/15/13 0959 09/15/13 1057      Medications:  Scheduled: . acyclovir  10 mg/kg (Ideal) Intravenous Q12H  . antiseptic oral rinse  7 mL Mouth Rinse BID  . insulin aspart  0-15 Units Subcutaneous TID WC  . insulin aspart  0-5 Units Subcutaneous QHS  . insulin aspart  3 Units Subcutaneous TID WC    Objective: Vital signs in last 24 hours: Temp:  [98.8 F (37.1 C)-100.7 F (38.2 C)] 98.9 F (37.2 C) (09/11 0824) Pulse Rate:  [65-139] 114 (09/11 0900) Resp:  [20-36] 30 (09/11 0900) BP: (89-130)/(55-100) 100/68 mmHg (09/11 0900) SpO2:  [68 %-100 %] 100 % (09/11 0900)   General appearance: no distress and MSE 3/3 Resp: clear to auscultation bilaterally Cardio: regular rate and rhythm GI: normal findings: bowel sounds normal and soft, non-tender  Lab Results  Recent Labs  09/21/13 0420 09/22/13 0430  WBC 23.0* 18.8*  HGB 11.3* 9.7*  HCT 33.7* 29.2*  NA 150* 142  K 3.5* 3.9  CL 115* 110  CO2 25 23  BUN 33* 29*  CREATININE 1.99* 2.01*   Liver Panel  Recent Labs  09/19/13 2300  09/21/13 0420 09/22/13 0430  PROT 4.2*  < > 3.9* 3.5*  ALBUMIN 2.0*  < >  1.9* 1.7*  AST 136*  < > 84* 93*  ALT 258*  < > 183* 138*  ALKPHOS 459*  < > 450* 343*  BILITOT 1.6*  < > 1.0 0.8  BILIDIR 1.0*  --   --   --   IBILI 0.6  --   --   --   < > = values in this interval not displayed. Sedimentation Rate No results found for this basename: ESRSEDRATE,  in the last 72 hours C-Reactive Protein No results found for this basename: CRP,  in the last 72 hours  Microbiology: Recent Results (from the past 240 hour(s))  CULTURE, BLOOD (ROUTINE X 2)     Status: None   Collection Time    09/15/13 10:15 AM      Result Value Ref Range Status   Specimen Description BLOOD LEFT HAND   Final   Special Requests BOTTLES DRAWN AEROBIC AND ANAEROBIC 10CC   Final   Culture  Setup Time     Final   Value: 09/15/2013 14:27     Performed at [facility]    Culture     Final   Value: NO GROWTH 5 DAYS     Performed at [facility]   Report Status 09/21/2013 FINAL   Final  CULTURE, BLOOD (ROUTINE X 2)     Status: None   Collection Time    09/15/13 10:30 AM      Result Value Ref Range Status   Specimen Description BLOOD LEFT HAND   Final   Special Requests BOTTLES DRAWN AEROBIC AND ANAEROBIC 10CC   Final   Culture  Setup Time     Final   Value: 09/15/2013 14:28     Performed at [facility]   Culture     Final   Value: NO GROWTH 5 DAYS     Performed at [facility]   Report Status 09/21/2013 FINAL   Final  MRSA PCR SCREENING     Status: None   Collection Time    09/15/13  4:45 PM      Result Value Ref Range Status   MRSA by PCR NEGATIVE  NEGATIVE Final   Comment:            The GeneXpert MRSA Assay (FDA     approved for NASAL specimens     only), is one component of a     comprehensive MRSA colonization     surveillance program. It is not     intended to diagnose MRSA     infection nor to guide or     monitor treatment for     MRSA infections.  CLOSTRIDIUM DIFFICILE BY PCR     Status: None   Collection Time    09/19/13  8:27 AM      Result Value Ref Range Status   C difficile by pcr NEGATIVE  NEGATIVE Final  CULTURE, EXPECTORATED SPUTUM-ASSESSMENT     Status: None   Collection Time    09/21/13 11:41 AM      Result Value Ref Range Status   Specimen Description SPUTUM   Final   Special Requests NONE   Final   Sputum evaluation     Final   Value: THIS SPECIMEN IS ACCEPTABLE. RESPIRATORY CULTURE REPORT TO FOLLOW.   Report Status 09/21/2013 FINAL   Final  CULTURE, BLOOD (ROUTINE X 2)     Status: None   Collection Time    09/21/13  3:54 PM  Result Value Ref Range Status   Specimen Description BLOOD RIGHT ARM   Final   Special Requests BOTTLES DRAWN AEROBIC AND ANAEROBIC 10CC   Final   Culture  Setup Time     Final   Value: 09/21/2013 19:21     Performed at [facility]   Culture     Final    Value:        BLOOD CULTURE RECEIVED NO GROWTH TO DATE CULTURE WILL BE HELD FOR 5 DAYS BEFORE ISSUING A FINAL NEGATIVE REPORT     Performed at [facility]   Report Status PENDING   Incomplete  CULTURE, BLOOD (ROUTINE X 2)     Status: None   Collection Time    09/21/13  4:05 PM      Result Value Ref Range Status   Specimen Description BLOOD RIGHT HAND   Final   Special Requests BOTTLES DRAWN AEROBIC ONLY 8CC   Final   Culture  Setup Time     Final   Value: 09/21/2013 19:18     Performed at [facility]   Culture     Final   Value:        BLOOD CULTURE RECEIVED NO GROWTH TO DATE CULTURE WILL BE HELD FOR 5 DAYS BEFORE ISSUING A FINAL NEGATIVE REPORT     Performed at [facility]   Report Status PENDING   Incomplete  CSF CULTURE     Status: None   Collection Time    09/21/13  4:41 PM      Result Value Ref Range Status   Specimen Description CSF   Final   Special Requests 1.3ML CSF FLUID NO 2   Final   Gram Stain     Final   Value: WBC PRESENT, PREDOMINANTLY MONONUCLEAR     NO ORGANISMS SEEN     CYTOSPIN Performed at [facility]     Performed at [facility]   Culture PENDING   Incomplete   Report Status PENDING   Incomplete  GRAM STAIN     Status: None   Collection Time    09/21/13  4:41 PM      Result Value Ref Range Status   Specimen Description CSF   Final   Special Requests 1.3ML CSF FLUID   Final   Gram Stain     Final   Value: WBC PRESENT, PREDOMINANTLY MONONUCLEAR     NO ORGANISMS SEEN     CYTOSPIN SLIDE   Report Status 09/21/2013 FINAL   Final    Studies/Results: Mr Jeri Cos Wo Contrast  09/21/2013   CLINICAL DATA:  Fever with leukocytosis, encephalopathy, and possible seizure. Evaluate for encephalitis.  EXAM: MRI HEAD WITHOUT AND WITH CONTRAST  TECHNIQUE: Multiplanar, multiecho pulse sequences of the brain and surrounding structures were obtained without and with intravenous contrast.  CONTRAST:  50mL MULTIHANCE GADOBENATE  DIMEGLUMINE 529 MG/ML IV SOLN  COMPARISON:  Head CT 09/19/2013  FINDINGS: Dedicated thin section imaging through the temporal lobes demonstrates normal volume and signal of the hippocampi. There is no evidence of heterotopia.  There is no evidence of acute infarct, intracranial hemorrhage, mass, midline shift, or extra-axial fluid collection. Minimal periventricular white matter T2 hyperintensity is nonspecific and less than is often seen in patients of this age. There is mild generalized cerebral atrophy. No abnormal enhancement is identified, although postcontrast images are moderately degraded by motion.  Orbits are unremarkable. There is moderate bilateral ethmoid air cell mucosal thickening,  with mild mucosal thickening in the remainder of the paranasal sinuses. Small bilateral mastoid effusions are present. Major intracranial vascular flow voids are preserved.  IMPRESSION: 1. No acute intracranial abnormality identified. 2. Mild cerebral atrophy. 3. Moderate paranasal sinus inflammatory mucosal disease and small bilateral mastoid effusions.   Electronically Signed   By: Logan Bores   On: 09/21/2013 20:58   Dg Chest Port 1 View  09/22/2013   CLINICAL DATA:  Respiratory failure.  EXAM: PORTABLE CHEST - 1 VIEW  COMPARISON:  09/20/2013.  FINDINGS: Interim extubation. Right IJ line stable position. Mediastinum and hilar structures are stable. Bibasilar atelectasis. No pleural effusion pneumothorax. Heart size stable. No acute osseous abnormality. Mediastinum and hilar structures are normal.  IMPRESSION: 1. Interim extubation.  Right IJ line in stable position. 2. Bibasilar atelectasis.   Electronically Signed   By: Marcello Moores  Register   On: 09/22/2013 07:17     Assessment/Plan: Fever  Leukocytosis  Encepholopathy LLE DVT  Total days of antibiotics: off 9-9 9-4 vanco/ceftriaxone 9-9  Will add state arbovirus panel to CSF if available.  Pending: HSV, HIV RNA, ANA, ANCA, CSF Cx. RMSF, Ehrilchia.    Started on ACV last pm. Will continue til his HSV pcr results.   LFTs better over last week.          Bobby Rumpf Infectious Diseases (pager) [phone] [email or web address] 09/22/2013, 9:42 AM  LOS: 7 days

## 2013-09-22 NOTE — Progress Notes (Signed)
ANTICOAGULATION CONSULT NOTE - Follow Up Consult  Pharmacy Consult:  Bivalirudin Indication:  Rule out HIT  No Known Allergies  Patient Measurements: Height: 6' (182.9 cm) Weight: 197 lb 8.5 oz (89.6 kg) IBW/kg (Calculated) : 77.6  Vital Signs: Temp: 101.7 F (38.7 C) (09/11 1542) Temp src: Oral (09/11 1542) BP: 94/58 mmHg (09/11 1500) Pulse Rate: 119 (09/11 1500)  Labs:  Recent Labs  09/19/13 2300 09/20/13 0500 09/21/13 0420 09/22/13 0430 09/22/13 1300 09/22/13 1500  HGB 10.9* 10.8* 11.3* 9.7*  --   --   HCT 32.4* 32.4* 33.7* 29.2*  --   --   PLT 154 153 127* 84*  --   --   APTT  --   --   --   --  197* 125*  LABPROT 17.1*  --   --   --   --   --   INR 1.39  --   --   --   --   --   HEPARINUNFRC  --   --   --  0.62  --   --   CREATININE 1.83* 1.79* 1.99* 2.01*  --   --   TROPONINI <0.30  --   --   --   --   --     Estimated Creatinine Clearance: 38.6 ml/min (by C-G formula based on Cr of 2.01).     Assessment: 53 YOM with new LLE DVT and possible HIT to continue on bivalirudin.  APTT is supra-therapeutic but is trending down.  No bleeding reported.   Goal of Therapy:  aPTT 50 - 85 seconds Monitor platelets by anticoagulation protocol: Yes    Plan:  - Hold bivalirudin for 1 hour, then resume infusion at 0.05 mg/kg/hr (= 9 ml/hr) - Check aPTT 4 hours after infusion resumed - Monitor closely for s/sx of bleeding    Osualdo Hansell D. Mina Marble, PharmD, BCPS Pager:  [phone] 09/22/2013, 5:07 PM

## 2013-09-23 LAB — BASIC METABOLIC PANEL
Anion gap: 9 (ref 5–15)
BUN: 30 mg/dL — ABNORMAL HIGH (ref 6–23)
CO2: 23 mEq/L (ref 19–32)
CREATININE: 2.22 mg/dL — AB (ref 0.50–1.35)
Calcium: 6.8 mg/dL — ABNORMAL LOW (ref 8.4–10.5)
Chloride: 113 mEq/L — ABNORMAL HIGH (ref 96–112)
GFR calc Af Amer: 33 mL/min — ABNORMAL LOW (ref >90)
GFR calc non Af Amer: 29 mL/min — ABNORMAL LOW (ref >90)
Glucose, Bld: 148 mg/dL — ABNORMAL HIGH (ref 70–99)
Potassium: 4.2 mEq/L (ref 3.7–5.3)
SODIUM: 145 meq/L (ref 137–147)

## 2013-09-23 LAB — CBC
HCT: 27.4 % — ABNORMAL LOW (ref 39.0–52.0)
Hemoglobin: 9.3 g/dL — ABNORMAL LOW (ref 13.0–17.0)
MCH: 29.5 pg (ref 26.0–34.0)
MCHC: 33.9 g/dL (ref 30.0–36.0)
MCV: 87 fL (ref 78.0–100.0)
Platelets: 70 10*3/uL — ABNORMAL LOW (ref 150–400)
RBC: 3.15 MIL/uL — ABNORMAL LOW (ref 4.22–5.81)
RDW: 15 % (ref 11.5–15.5)
WBC: 16.8 10*3/uL — ABNORMAL HIGH (ref 4.0–10.5)

## 2013-09-23 LAB — CULTURE, RESPIRATORY W GRAM STAIN

## 2013-09-23 LAB — CULTURE, RESPIRATORY

## 2013-09-23 LAB — GLUCOSE, CAPILLARY
GLUCOSE-CAPILLARY: 184 mg/dL — AB (ref 70–99)
GLUCOSE-CAPILLARY: 191 mg/dL — AB (ref 70–99)
GLUCOSE-CAPILLARY: 171 mg/dL — AB (ref 70–99)
Glucose-Capillary: 134 mg/dL — ABNORMAL HIGH (ref 70–99)

## 2013-09-23 LAB — HEMOGLOBIN A1C
Hgb A1c MFr Bld: 6.4 % — ABNORMAL HIGH (ref <5.7)
MEAN PLASMA GLUCOSE: 137 mg/dL — AB (ref <117)

## 2013-09-23 LAB — FOLATE: FOLATE: 7.1 ng/mL

## 2013-09-23 LAB — RETICULOCYTES
RBC.: 3.03 MIL/uL — ABNORMAL LOW (ref 4.22–5.81)
Retic Ct Pct: <0.4 % — ABNORMAL LOW (ref 0.4–3.1)

## 2013-09-23 LAB — APTT: APTT: 73 s — AB (ref 24–37)

## 2013-09-23 LAB — VITAMIN B12
VITAMIN B 12: 694 pg/mL (ref 211–911)
Vitamin B-12: 862 pg/mL (ref 211–911)

## 2013-09-23 LAB — FERRITIN: Ferritin: 4208 ng/mL — ABNORMAL HIGH (ref 22–322)

## 2013-09-23 MED ORDER — LOPERAMIDE HCL 2 MG PO CAPS
2.0000 mg | ORAL_CAPSULE | Freq: Four times a day (QID) | ORAL | Status: DC | PRN
  Administered 2013-09-23: 2 mg via ORAL
  Filled 2013-09-23 (×2): qty 1

## 2013-09-23 NOTE — Progress Notes (Signed)
NEURO HOSPITALIST PROGRESS NOTE   SUBJECTIVE:                                                                                                                        Resting comfortably in bed and has no neurological complains. Answering questions appropriately and able to sustain a normal conversation. CSF HSV negative. On acyclovir.   OBJECTIVE:                                                                                                                           Vital signs in last 24 hours: Temp:  [98.5 F (36.9 C)-101.7 F (38.7 C)] 99.4 F (37.4 C) (09/12 0949) Pulse Rate:  [97-123] 97 (09/12 0949) Resp:  [20-40] 20 (09/12 0949) BP: (90-131)/(56-75) 131/68 mmHg (09/12 0949) SpO2:  [94 %-100 %] 98 % (09/12 0949)  Intake/Output from previous day: 09/11 0701 - 09/12 0700 In: 1379.9 [P.O.:440; I.V.:774.4; IV Piggyback:165.5] Out: 1035 [Urine:1035] Intake/Output this shift: Total I/O In: 240 [P.O.:240] Out: 350 [Urine:350] Nutritional status:    Past Medical History  Diagnosis Date  . Hypertension   . Diabetes mellitus without complication   . Cancer     Neurologic Exam:  Mental Status:  Alert, oriented, thought content appropriate. Speech fluent without evidence of aphasia. Able to follow 3 step commands without difficulty.  Cranial Nerves:  II: Discs flat bilaterally; Visual fields grossly normal, pupils equal, round, reactive to light and accommodation  III,IV, VI: ptosis not present, extra-ocular motions intact bilaterally  V,VII: smile symmetric, facial light touch sensation normal bilaterally  VIII: hearing normal bilaterally  IX,X: gag reflex present  XI: bilateral shoulder shrug  XII: midline tongue extension without atrophy or fasciculations  Motor:  Right : Upper extremity 5/5 Left: Upper extremity 5/5  Lower extremity 5/5 Lower extremity 5/5  Tone and bulk:normal tone throughout; no atrophy noted  Sensory:  Pinprick and light touch intact throughout, bilaterally  Deep Tendon Reflexes:  Right: Upper Extremity Left: Upper extremity  biceps (C-5 to C-6) 2/4 biceps (C-5 to C-6) 2/4  tricep (C7) 2/4 triceps (C7) 2/4  Brachioradialis (C6) 2/4 Brachioradialis (C6) 2/4  Lower Extremity Lower Extremity  quadriceps (L-2 to L-4) 2/4 quadriceps (L-2 to L-4) 2/4  Achilles (S1) 2/4 Achilles (S1) 2/4  Plantars:  Right: downgoing Left: downgoing  Cerebellar:  normal finger-to-nose, normal heel-to-shin test  Gait: No tested   Lab Results: No results found for this basename: cbc, bmp, coags, chol, tri, ldl, hga1c   Lipid Panel No results found for this basename: CHOL, TRIG, HDL, CHOLHDL, VLDL, LDLCALC,  in the last 72 hours  Studies/Results: Mr Jeri Cos Wo Contrast  09/21/2013   CLINICAL DATA:  Fever with leukocytosis, encephalopathy, and possible seizure. Evaluate for encephalitis.  EXAM: MRI HEAD WITHOUT AND WITH CONTRAST  TECHNIQUE: Multiplanar, multiecho pulse sequences of the brain and surrounding structures were obtained without and with intravenous contrast.  CONTRAST:  44mL MULTIHANCE GADOBENATE DIMEGLUMINE 529 MG/ML IV SOLN  COMPARISON:  Head CT 09/19/2013  FINDINGS: Dedicated thin section imaging through the temporal lobes demonstrates normal volume and signal of the hippocampi. There is no evidence of heterotopia.  There is no evidence of acute infarct, intracranial hemorrhage, mass, midline shift, or extra-axial fluid collection. Minimal periventricular white matter T2 hyperintensity is nonspecific and less than is often seen in patients of this age. There is mild generalized cerebral atrophy. No abnormal enhancement is identified, although postcontrast images are moderately degraded by motion.  Orbits are unremarkable. There is moderate bilateral ethmoid air cell mucosal thickening, with mild mucosal thickening in the remainder of the paranasal sinuses. Small bilateral mastoid effusions are present.  Major intracranial vascular flow voids are preserved.  IMPRESSION: 1. No acute intracranial abnormality identified. 2. Mild cerebral atrophy. 3. Moderate paranasal sinus inflammatory mucosal disease and small bilateral mastoid effusions.   Electronically Signed   By: Logan Bores   On: 09/21/2013 20:58   Dg Chest Port 1 View  09/22/2013   CLINICAL DATA:  Respiratory failure.  EXAM: PORTABLE CHEST - 1 VIEW  COMPARISON:  09/20/2013.  FINDINGS: Interim extubation. Right IJ line stable position. Mediastinum and hilar structures are stable. Bibasilar atelectasis. No pleural effusion pneumothorax. Heart size stable. No acute osseous abnormality. Mediastinum and hilar structures are normal.  IMPRESSION: 1. Interim extubation.  Right IJ line in stable position. 2. Bibasilar atelectasis.   Electronically Signed   By: Marcello Moores  Register   On: 09/22/2013 07:17    MEDICATIONS                                                                                                                        Scheduled: . acyclovir  10 mg/kg (Ideal) Intravenous Q12H  . antiseptic oral rinse  7 mL Mouth Rinse BID  . insulin aspart  0-15 Units Subcutaneous TID WC  . insulin aspart  0-5 Units Subcutaneous QHS  . insulin aspart  3 Units Subcutaneous TID WC  . insulin glargine  10 Units Subcutaneous QHS    ASSESSMENT/PLAN:  68 y/o admitted to [facility] with acute confusional state and concerns for seizure. Patient doing remarkably better. CSF HSV negative. Acyclovir as per ID. Neurology will sign off  Dorian Pod, MD [facility] [phone]  09/23/2013, 12:59 PM

## 2013-09-23 NOTE — Progress Notes (Signed)
ANTICOAGULATION CONSULT NOTE - Follow Up Consult  Pharmacy Consult:  Bivalirudin Indication:  Rule out HIT  No Known Allergies  Patient Measurements: Height: 6' (182.9 cm) Weight: 197 lb 8.5 oz (89.6 kg) IBW/kg (Calculated) : 77.6  Vital Signs: Temp: 99.7 F (37.6 C) (09/11 2156) Temp src: Rectal (09/11 2156) BP: 113/75 mmHg (09/11 2043) Pulse Rate: 113 (09/11 2043)  Labs:  Recent Labs  09/20/13 0500 09/21/13 0420 09/22/13 0430 09/22/13 1300 09/22/13 1500 09/22/13 2255  HGB 10.8* 11.3* 9.7*  --   --   --   HCT 32.4* 33.7* 29.2*  --   --   --   PLT 153 127* 84*  --   --   --   APTT  --   --   --  197* 125* 76*  HEPARINUNFRC  --   --  0.62  --   --   --   CREATININE 1.79* 1.99* 2.01*  --   --   --     Estimated Creatinine Clearance: 38.6 ml/min (by C-G formula based on Cr of 2.01).     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  APTT is supra-therapeutic but is trending down.  No bleeding reported.   Goal of Therapy:  aPTT 50 - 85 seconds Monitor platelets by anticoagulation protocol: Yes    Plan:  Aptt 76 seconds. No bleeding noted. Continue at current rate. F/u with am labs.     Curlene Dolphin

## 2013-09-23 NOTE — Progress Notes (Signed)
Patient ID: William Stark, male   DOB: 01-28-1945, 68 y.o.   MRN: [id number]         [facility]    Date of Admission:  09/15/2013     Off of antibiotics for 3 days        Day 3 acyclovir Principal Problem:   Severe sepsis Active Problems:   Hypovolemic shock   Diabetes 1.5, managed as type 2   Acute renal injury   Hyperkalemia   Elevated liver enzymes   Increased anion gap metabolic acidosis   Hypoglycemia   Cellulitis of right foot   Acute renal failure   Acute encephalopathy   Sepsis   Encephalopathy acute   Asterixis   Intermittent fever of unknown origin   DVT of lower extremity (deep venous thrombosis)   Thrombocytopenia   . acyclovir  10 mg/kg (Ideal) Intravenous Q12H  . antiseptic oral rinse  7 mL Mouth Rinse BID  . insulin aspart  0-15 Units Subcutaneous TID WC  . insulin aspart  0-5 Units Subcutaneous QHS  . insulin aspart  3 Units Subcutaneous TID WC  . insulin glargine  10 Units Subcutaneous QHS    Subjective: He denies any headache.  Objective: Temp:  [98.5 F (36.9 C)-100.9 F (38.3 C)] 99.4 F (37.4 C) (09/12 0949) Pulse Rate:  [97-123] 97 (09/12 0949) Resp:  [20-27] 20 (09/12 0949) BP: (99-131)/(56-75) 131/68 mmHg (09/12 0949) SpO2:  [94 %-99 %] 98 % (09/12 0949)  General: He is asleep but arouses easily. He remains confused and has trouble remembering what he wants to tell me. He is concerned about dry skin on his feet Skin: Dry skin on feet Neck: Supple Lungs: Clear Cor: Regular S1 and S2 with no murmurs Abdomen: Soft and nontender Mild nonpitting edema from midshin down over the dorsum of his feet  Lab Results Lab Results  Component Value Date   WBC 16.8* 09/23/2013   HGB 9.3* 09/23/2013   HCT 27.4* 09/23/2013   MCV 87.0 09/23/2013   PLT 70* 09/23/2013    Lab Results  Component Value Date   CREATININE 2.22* 09/23/2013   BUN 30* 09/23/2013   NA 145 09/23/2013   K 4.2 09/23/2013   CL 113* 09/23/2013   CO2  23 09/23/2013    Lab Results  Component Value Date   ALT 138* 09/22/2013   AST 93* 09/22/2013   ALKPHOS 343* 09/22/2013   BILITOT 0.8 09/22/2013      Microbiology: Recent Results (from the past 240 hour(s))  CULTURE, BLOOD (ROUTINE X 2)     Status: None   Collection Time    09/15/13 10:15 AM      Result Value Ref Range Status   Specimen Description BLOOD LEFT HAND   Final   Special Requests BOTTLES DRAWN AEROBIC AND ANAEROBIC 10CC   Final   Culture  Setup Time     Final   Value: 09/15/2013 14:27     Performed at [facility]   Culture     Final   Value: NO GROWTH 5 DAYS     Performed at [facility]   Report Status 09/21/2013 FINAL   Final  CULTURE, BLOOD (ROUTINE X 2)     Status: None   Collection Time    09/15/13 10:30 AM      Result Value Ref Range Status   Specimen Description BLOOD LEFT HAND   Final   Special Requests BOTTLES DRAWN AEROBIC AND ANAEROBIC 10CC  Final   Culture  Setup Time     Final   Value: 09/15/2013 14:28     Performed at [facility]   Culture     Final   Value: NO GROWTH 5 DAYS     Performed at [facility]   Report Status 09/21/2013 FINAL   Final  MRSA PCR SCREENING     Status: None   Collection Time    09/15/13  4:45 PM      Result Value Ref Range Status   MRSA by PCR NEGATIVE  NEGATIVE Final   Comment:            The GeneXpert MRSA Assay (FDA     approved for NASAL specimens     only), is one component of a     comprehensive MRSA colonization     surveillance program. It is not     intended to diagnose MRSA     infection nor to guide or     monitor treatment for     MRSA infections.  CLOSTRIDIUM DIFFICILE BY PCR     Status: None   Collection Time    09/19/13  8:27 AM      Result Value Ref Range Status   C difficile by pcr NEGATIVE  NEGATIVE Final  STOOL CULTURE     Status: None   Collection Time    09/20/13  5:04 PM      Result Value Ref Range Status   Specimen Description STOOL   Final   Special  Requests NONE   Final   Culture     Final   Value: NO SUSPICIOUS COLONIES, CONTINUING TO HOLD     Note: REDUCED NORMAL FLORA PRESENT     Performed at [facility]   Report Status PENDING   Incomplete  CULTURE, EXPECTORATED SPUTUM-ASSESSMENT     Status: None   Collection Time    09/21/13 11:41 AM      Result Value Ref Range Status   Specimen Description SPUTUM   Final   Special Requests NONE   Final   Sputum evaluation     Final   Value: THIS SPECIMEN IS ACCEPTABLE. RESPIRATORY CULTURE REPORT TO FOLLOW.   Report Status 09/21/2013 FINAL   Final  CULTURE, RESPIRATORY (NON-EXPECTORATED)     Status: None   Collection Time    09/21/13 11:41 AM      Result Value Ref Range Status   Specimen Description SPUTUM   Final   Special Requests NONE   Final   Gram Stain     Final   Value: RARE WBC PRESENT,BOTH PMN AND MONONUCLEAR     RARE SQUAMOUS EPITHELIAL CELLS PRESENT     NO ORGANISMS SEEN     Performed at [facility]   Culture     Final   Value: FEW CANDIDA ALBICANS     Performed at [facility]   Report Status 09/23/2013 FINAL   Final  CULTURE, BLOOD (ROUTINE X 2)     Status: None   Collection Time    09/21/13  3:54 PM      Result Value Ref Range Status   Specimen Description BLOOD RIGHT ARM   Final   Special Requests BOTTLES DRAWN AEROBIC AND ANAEROBIC 10CC   Final   Culture  Setup Time     Final   Value: 09/21/2013 19:21     Performed at [facility]     Final  Value:        BLOOD CULTURE RECEIVED NO GROWTH TO DATE CULTURE WILL BE HELD FOR 5 DAYS BEFORE ISSUING A FINAL NEGATIVE REPORT     Performed at [facility]   Report Status PENDING   Incomplete  CULTURE, BLOOD (ROUTINE X 2)     Status: None   Collection Time    09/21/13  4:05 PM      Result Value Ref Range Status   Specimen Description BLOOD RIGHT HAND   Final   Special Requests BOTTLES DRAWN AEROBIC ONLY 8CC   Final   Culture  Setup Time     Final   Value: 09/21/2013  19:18     Performed at [facility]   Culture     Final   Value:        BLOOD CULTURE RECEIVED NO GROWTH TO DATE CULTURE WILL BE HELD FOR 5 DAYS BEFORE ISSUING A FINAL NEGATIVE REPORT     Performed at [facility]   Report Status PENDING   Incomplete  CSF CULTURE     Status: None   Collection Time    09/21/13  4:41 PM      Result Value Ref Range Status   Specimen Description CSF   Final   Special Requests 1.3ML CSF FLUID NO 2   Final   Gram Stain     Final   Value: WBC PRESENT, PREDOMINANTLY MONONUCLEAR     NO ORGANISMS SEEN     CYTOSPIN Performed at [facility]     Performed at [facility]   Culture     Final   Value: NO GROWTH 2 DAYS     Performed at [facility]   Report Status PENDING   Incomplete  GRAM STAIN     Status: None   Collection Time    09/21/13  4:41 PM      Result Value Ref Range Status   Specimen Description CSF   Final   Special Requests 1.3ML CSF FLUID   Final   Gram Stain     Final   Value: WBC PRESENT, PREDOMINANTLY MONONUCLEAR     NO ORGANISMS SEEN     CYTOSPIN SLIDE   Report Status 09/21/2013 FINAL   Final  AFB CULTURE WITH SMEAR     Status: None   Collection Time    09/21/13  4:43 PM      Result Value Ref Range Status   Specimen Description CSF   Final   Special Requests 1.3ML CSF FLUID   Final   Acid Fast Smear     Final   Value: NO ACID FAST BACILLI SEEN     Performed at [facility]   Culture     Final   Value: CULTURE WILL BE EXAMINED FOR 6 WEEKS BEFORE ISSUING A FINAL REPORT     Performed at [facility]   Report Status PENDING   Incomplete   CSF herpes simplex PCR: Negative  Assessment: 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 With a negative PCR this is unlikely to be herpes meningoencephalitis so I will stop acyclovir. He seems to be improving slowly. There is a remote possibility is meningitis could and related to recent  trimethoprim-sulfamethoxazole all this usually causes a neutrophilic predominance in the CSF.  Plan: 1. Discontinue acyclovir and observe off of antimicrobial agents  Michel Bickers, MD [facility] [place] [phone] pager  [phone] cell 09/23/2013, 4:04 PM

## 2013-09-23 NOTE — Progress Notes (Signed)
Discontinued flexi seal per MD order, patient tolerated well.  Cooling blanket discontinued per MD order.

## 2013-09-23 NOTE — Evaluation (Signed)
Physical Therapy Evaluation Patient Details Name: William Stark MRN: [id number] DOB: June 15, 1945 Today's Date: 09/23/2013   History of Present Illness  Patient is a 68 y/o male admitted with AMS, hypotension and renal failure. CHest XRAY- Bibasilar atelectasis. Pt with long complicated hospital course - found to be in acute renal failure with a creatinine of 10, hyperkalemic and with hypotension along with pancytopenia and thrombocytopenia. He was started on vancomycin and Zosyn for suspected cellulitis, while in ICU there was suspicion of seizures and pt was seen by neurology, EEG and MRI were nonacute. He also underwent LP by pulmonary critical care which was not consistent with bacterial meningitis. Found to have DVT (+) LLE. Required intubation for one day between 9/8 and 09/20/2013   Clinical Impression  Patient presents with functional limitations due to deficits listed in PT problem list (see below). Pt with generalized weakness and balance deficits limiting safe mobility. Pt high falls risk. Pt with slow processing and response to questions with some inconsistencies. Gait training limited due to abnormal elevation of HR with minimal activity. Not sure of accuracy of home setup/PLOF etc. Pt would benefit from acute PT and follow up ST SNF to improve transfers, gait, balance and overall safe mobility so pt can maximize independence, minimize fall risk and return to PLOF.    Follow Up Recommendations SNF;Supervision/Assistance - 24 hour    Equipment Recommendations  Rolling walker with 5" wheels    Recommendations for Other Services       Precautions / Restrictions Precautions Precautions: Fall Precaution Comments: elevated HR Restrictions Weight Bearing Restrictions: No      Mobility  Bed Mobility Overal bed mobility: Modified Independent             General bed mobility comments: HOB elevated, use of rails. Increased time and cues for technique.  Transfers Overall  transfer level: Needs assistance Equipment used: Rolling walker (2 wheeled) Transfers: Sit to/from Stand Sit to Stand: Min assist         General transfer comment: VC's for hand placement and technique.  Ambulation/Gait Ambulation/Gait assistance: Min assist Ambulation Distance (Feet): 4 Feet Assistive device: Rolling walker (2 wheeled) Gait Pattern/deviations: Step-through pattern;Decreased stride length;Trunk flexed     General Gait Details: Pt able to perform marching in place with unsteadiness noted- Min A for balance. Ambulated a few feet to bedside chair. Uncontrolled descent into chair. Increased shakiness noted in Bil knees during gait. RHR 111 bpm. increased to 135 bpm upon standing. Distance limited due to elevated HR.  Stairs            Wheelchair Mobility    Modified Rankin (Stroke Patients Only)       Balance Overall balance assessment: Needs assistance   Sitting balance-Leahy Scale: Fair Sitting balance - Comments: Able to initiate donning sock on 1 foot with increased difficulty and increased time, posterior lean. Required total A to donn other sock due to fatigue. Postural control: Posterior lean   Standing balance-Leahy Scale: Poor Standing balance comment: Requires UE support onR W during dynamic standing activities due to balance deficits/weakness.                             Pertinent Vitals/Pain Pain Assessment: No/denies pain    Home Living Family/patient expects to be discharged to:: Skilled nursing facility Living Arrangements: Spouse/significant other Available Help at Discharge: Family;Available PRN/intermittently Type of Home: House Home Access: Level entry  Home Layout: One level Home Equipment: None      Prior Function Level of Independence: Independent         Comments: Pt slow to respond to questions with changes in answers - pt seemed unsure of reported social situation/home setup. No family members  present to corroborate reported info.     Hand Dominance        Extremity/Trunk Assessment   Upper Extremity Assessment: Generalized weakness           Lower Extremity Assessment: Generalized weakness         Communication   Communication: No difficulties  Cognition Arousal/Alertness: Awake/alert Behavior During Therapy: WFL for tasks assessed/performed Overall Cognitive Status: No family/caregiver present to determine baseline cognitive functioning                      General Comments      Exercises        Assessment/Plan    PT Assessment Patient needs continued PT services  PT Diagnosis Generalized weakness   PT Problem List Decreased strength;Cardiopulmonary status limiting activity;Decreased activity tolerance;Decreased knowledge of use of DME;Decreased balance;Decreased safety awareness;Decreased mobility;Decreased knowledge of precautions  PT Treatment Interventions DME instruction;Balance training;Gait training;Patient/family education;Functional mobility training;Therapeutic activities;Therapeutic exercise   PT Goals (Current goals can be found in the Care Plan section) Acute Rehab PT Goals Patient Stated Goal: to get better PT Goal Formulation: With patient Time For Goal Achievement: 10/07/13 Potential to Achieve Goals: Good    Frequency Min 3X/week   Barriers to discharge        Co-evaluation               End of Session Equipment Utilized During Treatment: Gait belt Activity Tolerance: Patient limited by fatigue Patient left: in chair;with call bell/phone within reach Nurse Communication: Mobility status         Time: 0737-1062 PT Time Calculation (min): 32 min   Charges:   PT Evaluation $Initial PT Evaluation Tier I: 1 Procedure PT Treatments $Therapeutic Activity: 8-22 mins   PT G CodesCandy Sledge A 09/23/2013, 5:23 PM Candy Sledge, [place], DPT [phone]

## 2013-09-23 NOTE — Progress Notes (Signed)
Patient Demographics  William Stark, is a 68 y.o. male, DOB - 10-Mar-1945, JWJ:[id number]  Admit date - 09/15/2013   Admitting Physician William Males, MD  Outpatient Primary MD for the patient is [facility], MD  LOS - 8   Chief Complaint  Patient presents with  . Hypotension        Brief summary. 68 year old African American male with history of anxiety, type 2 diabetes mellitus and hypertension who was admitted to this hospital on 09/15/2013 by pulmonary critical care after he presented to the ER with altered mental status at that time thought to be secondary to right lower extremity cellulitis and sepsis. He was also found to be in acute renal failure with a creatinine of 10, hyperkalemia and hypotension along with pancytopenia and thrombocytopenia. He was started on vancomycin and Zosyn for suspected cellulitis, while in ICU there was suspicion of seizures and was seen by neurology, EEG and MRI were nonacute. He also underwent LP by pulmonary critical care which was not consistent with bacterial meningitis, ID is following the patient in controlling antibiotics. Also in ICU he had left lower extremity DVT, due to thrombocytopenia he has been started on Angiomax infusion.  He required intubation for one day between 9/8 and 09/20/2013, he continued to have fevers, he was placed on cooling blanket, had Foley catheter and a rectal tube and was transferred to the floor on 09/23/2048 under my service on day 8 of his hospital stay.    Subjective:   William Stark today has, No headache, No chest pain, No abdominal pain - No Nausea, No new weakness tingling or numbness, No Cough - SOB.    Assessment & Plan    Principal Problem:   Severe sepsis Active Problems:   Hypovolemic shock   Diabetes  1.5, managed as type 2   Acute renal injury   Hyperkalemia   Elevated liver enzymes   Increased anion gap metabolic acidosis   Hypoglycemia   Cellulitis of right foot   Acute renal failure   Acute encephalopathy   Sepsis   Encephalopathy acute   Asterixis   Intermittent fever of unknown origin   DVT of lower extremity (deep venous thrombosis)   Thrombocytopenia    1. Encephalopathy. Questionable seizure activity while in ICU. Secondary to infection of unclear source. Bacterial meningitis ruled out, seen by neurology, CT scan had MRI brain and EEG nonacute. LP showed mild increase in lymphocytes however not very impressive. CSF Gram stain negative, HSV PCR is negative, RPR negative, ANA and ANCA negative, hepatitis and [facility] spotted fever serology negative, HIV non reactive, ammonia stable was slightly high on admission, added B12, encephalopathy seems to be improving. He is awake and alert at this time. He is currently on  acyclovir which is being controlled by ID.   Ehrlichia  AB pending.    2. Chronic anxiety. Continue Xanax when necessary.    3. Right lower extremity DVT. Currently on Angiomax infusion due to severe thrombocytopenia.    4. Pancytopenia. Question due to infection and sepsis unclear source, monitor with supportive care. Get An. panel, check B12, request pathology to review smear. William Stark panel pending.    5. Questionable seizure in ICU. Seen by neurology. EEG-CT/MRI nonacute. None procedure  medications per neurology. Seizure free now.    6.William Stark - currently on Lantus along with sliding scale continue. Will check 1C.  No results found for this basename: HGBA1C    CBG (last 3)   Recent Labs  09/22/13 1239 09/22/13 1809 09/22/13 2035  GLUCAP 148* 90 103*    7.Smoker - counseled.    8.AKI on CKD 3 - had creatinine of of 10 upon admission, was thought to be secondary to dehydration and sepsis. With hydration has improved. Was seen initially  by renal who has signed off. We'll monitor trend and avoid nephrotoxins.    9. Diarrhea. C. difficile negative. Discontinue rectal tube and monitor. If needed we'll place on Imodium.      10. Persistently elevated LFTs. Acute hepatitis panel negative, ammonia level was slightly high upon admission, right upper quadrant ultrasound ordered.      Code Status: Full  Family Communication: none present  Disposition Plan: TBD   Procedures    9/04 Renal US: no hydronephrosis  9/04 Renal Consult: AKI due to hypovolemic shock, worsened on an ACE-I and NSAIDS . Metabolic acidosis with elevated lactate. Sodium bicarbonate initiated  9/05 - 9/08 Renal function improving. Cr  9/06 Low grade fever  9/07 - 9/08 increasing fever and confusion  9/08 Facial twitching per RN. Concern for seizure  9/08 EEG: This EEG could be consistent with a moderate nonspecific generalized cerebral dysfunction with superimposed medication effect in the form of beta activity secondary previously administered Ativan. There is a possibility that the recurrent bursts of activity could be epileptiform in nature, but this is not definite by this study. Repeat study with Ativan administration may be helpful to differentiate  9/08 HIV negative  9/08 PM: Worsening AMS/agitation - improved with dexmedetomidine. Intubated for "respiratory distress".  9/08 CT head: William Stark  9/09 early AM: Neurology consult - recommended repeat EEG with lorazepam administration and MRI brain  9/09 Passed SBT. Extubated. Encephalopathic. Asterixis noted. ? Delusional thinking. William Stark ordered  9/10 Tolerating extubation. Cooperative. Oriented but personality is slightly bizarre (unclear if baseline). Occasional facial twitching. No convulsive episodes. Fevers persist. ID consult requested for fever of unclear etiology.  9/10 LE venous duplex: consistent with acute deep vein thrombosis involving the left posterial tibial vein  9/10 MRI brain:  William Stark  9/10 LP (William Stark): 20 WBC, 96% lymphs, protein 95, glu 65, GS negative  9/11 Transfer to med-surg ordered. TRH to assume care as of AM 9/12 and PCCM to sign off  R IJ CVL 9/04 >> (left in due to poor peripheral targets)  ETT 9/08 >> 9/09 Right upper quadrant ultrasound ordered 09/23/2013 Peripheral smear ordered 09/23/2013    Consults ID, Neuro, Renal, PCCM   Medications  Scheduled Meds: . acyclovir  10 mg/kg (Ideal) Intravenous Q12H  . antiseptic oral rinse  7 mL Mouth Rinse BID  . insulin aspart  0-15 Units Subcutaneous TID WC  . insulin aspart  0-5 Units Subcutaneous QHS  . insulin aspart  3 Units Subcutaneous TID WC  . insulin glargine  10 Units Subcutaneous QHS   Continuous Infusions: . sodium chloride 10 mL/hr at 09/21/13 0600  . bivalirudin (ANGIOMAX) infusion 0.5 mg/mL (Non-ACS indications) 0.05 mg/kg/hr (09/22/13 1813)  . dextrose 5 % with KCl 20 mEq / L 20 mEq (09/22/13 1817)   William Meds:.ALPRAZolam, fentaNYL, LORazepam, metoprolol, sodium chloride  DVT Prophylaxis    Angiomax  Lab Results  Component Value Date   PLT 70* 09/23/2013    Antibiotics  Anti-infectives   Start     Dose/Rate Route Frequency Ordered Stop   09/21/13 2100  acyclovir (ZOVIRAX) 775 mg in dextrose 5 % 150 mL IVPB     10 mg/kg  77.6 kg (Ideal) 165.5 mL/hr over 60 Minutes Intravenous Every 12 hours 09/21/13 2011     09/19/13 2200  vancomycin (VANCOCIN) IVPB 750 mg/150 ml premix  Status:  Discontinued     750 mg 150 mL/hr over 60 Minutes Intravenous Every 12 hours 09/19/13 1043 09/19/13 2202   09/19/13 1000  vancomycin (VANCOCIN) IVPB 1000 mg/200 mL premix  Status:  Discontinued     1,000 mg 200 mL/hr over 60 Minutes Intravenous Every 24 hours 09/18/13 1216 09/19/13 1043   09/17/13 1100  piperacillin-tazobactam (ZOSYN) IVPB 3.375 g  Status:  Discontinued     3.375 g 12.5 mL/hr over 240 Minutes Intravenous 3 times per day 09/17/13 0922 09/20/13 1217   09/17/13 1000  vancomycin  (VANCOCIN) IVPB 750 mg/150 ml premix  Status:  Discontinued     750 mg 150 mL/hr over 60 Minutes Intravenous Every 24 hours 09/17/13 0922 09/18/13 1216   09/15/13 1700  piperacillin-tazobactam (ZOSYN) IVPB 2.25 g  Status:  Discontinued     2.25 g 100 mL/hr over 30 Minutes Intravenous Every 6 hours 09/15/13 1354 09/17/13 0922   09/15/13 1000  vancomycin (VANCOCIN) 1,500 mg in sodium chloride 0.9 % 500 mL IVPB     1,500 mg 250 mL/hr over 120 Minutes Intravenous  Once 09/15/13 0959 09/15/13 1245   09/15/13 1000  piperacillin-tazobactam (ZOSYN) IVPB 3.375 g     3.375 g 100 mL/hr over 30 Minutes Intravenous  Once 09/15/13 0959 09/15/13 1057          Objective:   Filed Vitals:   09/22/13 2043 09/22/13 2156 09/23/13 0657 09/23/13 0949  BP: 113/75  115/66 131/68  Pulse: 113  103 97  Temp: 100.9 F (38.3 C) 99.7 F (37.6 C) 98.5 F (36.9 C) 99.4 F (37.4 C)  TempSrc: Rectal Rectal Oral Oral  Resp: 24  22 20   Height:      Weight:      SpO2: 98%  94% 98%    Wt Readings from Last 3 Encounters:  09/19/13 89.6 kg (197 lb 8.5 oz)  08/15/13 95.255 kg (210 lb)     Intake/Output Summary (Last 24 hours) at 09/23/13 1119 Last data filed at 09/23/13 0941  Gross per 24 hour  Intake 690.63 ml  Output   1140 ml  Net -449.37 ml     Physical Exam  Awake Alert, Oriented X 2, No new F.N deficits, Normal affect [place].AT,PERRAL Supple Neck,No JVD, No cervical lymphadenopathy appriciated. R IJ C line Symmetrical Chest wall movement, Good air movement bilaterally, CTAB RRR,No Gallops,Rubs or new Murmurs, No Parasternal Heave +ve B.Sounds, Abd Soft, No tenderness, No organomegaly appriciated, No rebound - guarding or rigidity. No Cyanosis, Clubbing or edema, No new Rash or bruise    Data Review   Micro Results  Results for JAYKE, CAUL (MRN [id number]) as of 09/23/2013 11:18  Ref. Range 09/19/2013 10:40 09/19/2013 19:43 09/21/2013 15:00 09/21/2013 16:41  RPR Latest Range: NON REAC    NON REAC NON REAC   HSV 1 DNA Latest Range: Not Detected     Not Detected  HSV 2 DNA Latest Range: Not Detected     Not Detected  Specimen source hsv No range found    CSF  Hep A IgM Latest Range: NON REACTIVE  NON REACTIVE  Hepatitis B Surface Ag Latest Range: NEGATIVE  NEGATIVE     Hep B C IgM Latest Range: NON REACTIVE  NON REACTIVE     HCV Ab Latest Range: NEGATIVE  NEGATIVE     RMSF IgG No range found   0.17   RMSF IgM Latest Range: 0.00-0.89 IV   0.09   HIV 1 RNA Quant Latest Range: <20 copies/mL   <20   HIV1 RNA Quant, Log Latest Range: <1.30 log 10   <1.30   HIV Latest Range: NONREACTIVE  NONREACTIVE        Recent Results (from the past 240 hour(s))  CULTURE, BLOOD (ROUTINE X 2)     Status: None   Collection Time    09/15/13 10:15 AM      Result Value Ref Range Status   Specimen Description BLOOD LEFT HAND   Final   Special Requests BOTTLES DRAWN AEROBIC AND ANAEROBIC 10CC   Final   Culture  Setup Time     Final   Value: 09/15/2013 14:27     Performed at [facility]   Culture     Final   Value: NO GROWTH 5 DAYS     Performed at [facility]   Report Status 09/21/2013 FINAL   Final  CULTURE, BLOOD (ROUTINE X 2)     Status: None   Collection Time    09/15/13 10:30 AM      Result Value Ref Range Status   Specimen Description BLOOD LEFT HAND   Final   Special Requests BOTTLES DRAWN AEROBIC AND ANAEROBIC 10CC   Final   Culture  Setup Time     Final   Value: 09/15/2013 14:28     Performed at [facility]   Culture     Final   Value: NO GROWTH 5 DAYS     Performed at [facility]   Report Status 09/21/2013 FINAL   Final  MRSA PCR SCREENING     Status: None   Collection Time    09/15/13  4:45 PM      Result Value Ref Range Status   MRSA by PCR NEGATIVE  NEGATIVE Final   Comment:            The GeneXpert MRSA Assay (FDA     approved for NASAL specimens     only), is one component of a     comprehensive MRSA colonization      surveillance program. It is not     intended to diagnose MRSA     infection nor to guide or     monitor treatment for     MRSA infections.  CLOSTRIDIUM DIFFICILE BY PCR     Status: None   Collection Time    09/19/13  8:27 AM      Result Value Ref Range Status   C difficile by pcr NEGATIVE  NEGATIVE Final  STOOL CULTURE     Status: None   Collection Time    09/20/13  5:04 PM      Result Value Ref Range Status   Specimen Description STOOL   Final   Special Requests NONE   Final   Culture     Final   Value: NO SUSPICIOUS COLONIES, CONTINUING TO HOLD     Note: REDUCED NORMAL FLORA PRESENT     Performed at [facility]   Report Status PENDING   Incomplete  CULTURE, EXPECTORATED SPUTUM-ASSESSMENT     Status: None  Collection Time    09/21/13 11:41 AM      Result Value Ref Range Status   Specimen Description SPUTUM   Final   Special Requests NONE   Final   Sputum evaluation     Final   Value: THIS SPECIMEN IS ACCEPTABLE. RESPIRATORY CULTURE REPORT TO FOLLOW.   Report Status 09/21/2013 FINAL   Final  CULTURE, RESPIRATORY (NON-EXPECTORATED)     Status: None   Collection Time    09/21/13 11:41 AM      Result Value Ref Range Status   Specimen Description SPUTUM   Final   Special Requests NONE   Final   Gram Stain     Final   Value: RARE WBC PRESENT,BOTH PMN AND MONONUCLEAR     RARE SQUAMOUS EPITHELIAL CELLS PRESENT     NO ORGANISMS SEEN     Performed at [facility]   Culture     Final   Value: NO GROWTH 1 DAY     Performed at [facility]   Report Status PENDING   Incomplete  CULTURE, BLOOD (ROUTINE X 2)     Status: None   Collection Time    09/21/13  3:54 PM      Result Value Ref Range Status   Specimen Description BLOOD RIGHT ARM   Final   Special Requests BOTTLES DRAWN AEROBIC AND ANAEROBIC 10CC   Final   Culture  Setup Time     Final   Value: 09/21/2013 19:21     Performed at [facility]   Culture     Final   Value:        BLOOD  CULTURE RECEIVED NO GROWTH TO DATE CULTURE WILL BE HELD FOR 5 DAYS BEFORE ISSUING A FINAL NEGATIVE REPORT     Performed at [facility]   Report Status PENDING   Incomplete  CULTURE, BLOOD (ROUTINE X 2)     Status: None   Collection Time    09/21/13  4:05 PM      Result Value Ref Range Status   Specimen Description BLOOD RIGHT HAND   Final   Special Requests BOTTLES DRAWN AEROBIC ONLY 8CC   Final   Culture  Setup Time     Final   Value: 09/21/2013 19:18     Performed at [facility]   Culture     Final   Value:        BLOOD CULTURE RECEIVED NO GROWTH TO DATE CULTURE WILL BE HELD FOR 5 DAYS BEFORE ISSUING A FINAL NEGATIVE REPORT     Performed at [facility]   Report Status PENDING   Incomplete  CSF CULTURE     Status: None   Collection Time    09/21/13  4:41 PM      Result Value Ref Range Status   Specimen Description CSF   Final   Special Requests 1.3ML CSF FLUID NO 2   Final   Gram Stain     Final   Value: WBC PRESENT, PREDOMINANTLY MONONUCLEAR     NO ORGANISMS SEEN     CYTOSPIN Performed at [facility]     Performed at [facility]   Culture     Final   Value: NO GROWTH     Performed at [facility]   Report Status PENDING   Incomplete  GRAM STAIN     Status: None   Collection Time    09/21/13  4:41 PM  Result Value Ref Range Status   Specimen Description CSF   Final   Special Requests 1.3ML CSF FLUID   Final   Gram Stain     Final   Value: WBC PRESENT, PREDOMINANTLY MONONUCLEAR     NO ORGANISMS SEEN     CYTOSPIN SLIDE   Report Status 09/21/2013 FINAL   Final  AFB CULTURE WITH SMEAR     Status: None   Collection Time    09/21/13  4:43 PM      Result Value Ref Range Status   Specimen Description CSF   Final   Special Requests 1.3ML CSF FLUID   Final   Acid Fast Smear     Final   Value: NO ACID FAST BACILLI SEEN     Performed at [facility]   Culture     Final   Value: CULTURE WILL BE EXAMINED FOR 6  WEEKS BEFORE ISSUING A FINAL REPORT     Performed at [facility]   Report Status PENDING   Incomplete    Radiology Reports Mr Jeri Cos Wo Contrast  09/21/2013   CLINICAL DATA:  Fever with leukocytosis, encephalopathy, and possible seizure. Evaluate for encephalitis.  EXAM: MRI HEAD WITHOUT AND WITH CONTRAST  TECHNIQUE: Multiplanar, multiecho pulse sequences of the brain and surrounding structures were obtained without and with intravenous contrast.  CONTRAST:  47mL MULTIHANCE GADOBENATE DIMEGLUMINE 529 MG/ML IV SOLN  COMPARISON:  Head CT 09/19/2013  FINDINGS: Dedicated thin section imaging through the temporal lobes demonstrates normal volume and signal of the hippocampi. There is no evidence of heterotopia.  There is no evidence of acute infarct, intracranial hemorrhage, mass, midline shift, or extra-axial fluid collection. Minimal periventricular white matter T2 hyperintensity is nonspecific and less than is often seen in patients of this age. There is mild generalized cerebral atrophy. No abnormal enhancement is identified, although postcontrast images are moderately degraded by motion.  Orbits are unremarkable. There is moderate bilateral ethmoid air cell mucosal thickening, with mild mucosal thickening in the remainder of the paranasal sinuses. Small bilateral mastoid effusions are present. Major intracranial vascular flow voids are preserved.  IMPRESSION: 1. No acute intracranial abnormality identified. 2. Mild cerebral atrophy. 3. Moderate paranasal sinus inflammatory mucosal disease and small bilateral mastoid effusions.   Electronically Signed   By: Logan Bores   On: 09/21/2013 20:58   Dg Chest Port 1 View  09/22/2013   CLINICAL DATA:  Respiratory failure.  EXAM: PORTABLE CHEST - 1 VIEW  COMPARISON:  09/20/2013.  FINDINGS: Interim extubation. Right IJ line stable position. Mediastinum and hilar structures are stable. Bibasilar atelectasis. No pleural effusion pneumothorax. Heart size  stable. No acute osseous abnormality. Mediastinum and hilar structures are normal.  IMPRESSION: 1. Interim extubation.  Right IJ line in stable position. 2. Bibasilar atelectasis.   Electronically Signed   By: Marcello Moores  Register   On: 09/22/2013 07:17    CBC  Recent Labs Lab 09/19/13 2300 09/20/13 0500 09/21/13 0420 09/22/13 0430 09/23/13 0422  WBC 18.6* 24.6* 23.0* 18.8* 16.8*  HGB 10.9* 10.8* 11.3* 9.7* 9.3*  HCT 32.4* 32.4* 33.7* 29.2* 27.4*  PLT 154 153 127* 84* 70*  MCV 87.6 88.3 88.5 90.1 87.0  MCH 29.5 29.4 29.7 29.9 29.5  MCHC 33.6 33.3 33.5 33.2 33.9  RDW 14.8 15.1 15.3 15.2 15.0    Chemistries   Recent Labs Lab 09/18/13 0345  09/19/13 2300 09/20/13 0500 09/21/13 0420 09/22/13 0430 09/23/13 0422  NA 147  < > 150*  153* 150* 142 145  K 3.7  < > 3.4* 3.6* 3.5* 3.9 4.2  CL 112  < > 113* 116* 115* 110 113*  CO2 24  < > 24 24 25 23 23   GLUCOSE 172*  < > 93 125* 166* 228* 148*  BUN 45*  < > 30* 32* 33* 29* 30*  CREATININE 2.20*  < > 1.83* 1.79* 1.99* 2.01* 2.22*  CALCIUM 7.0*  < > 6.9* 6.8* 6.6* 6.5* 6.8*  AST 138*  --  136* 111* 84* 93*  --   ALT 455*  --  258* 240* 183* 138*  --   ALKPHOS 385*  --  459* 448* 450* 343*  --   BILITOT 1.7*  --  1.6* 1.3* 1.0 0.8  --   < > = values in this interval not displayed. ------------------------------------------------------------------------------------------------------------------ estimated creatinine clearance is 35 ml/min (by C-G formula based on Cr of 2.22). ------------------------------------------------------------------------------------------------------------------ No results found for this basename: HGBA1C,  in the last 72 hours ------------------------------------------------------------------------------------------------------------------ No results found for this basename: CHOL, HDL, LDLCALC, TRIG, CHOLHDL, LDLDIRECT,  in the last 72  hours ------------------------------------------------------------------------------------------------------------------ No results found for this basename: TSH, T4TOTAL, FREET3, T3FREE, THYROIDAB,  in the last 72 hours ------------------------------------------------------------------------------------------------------------------ No results found for this basename: VITAMINB12, FOLATE, FERRITIN, TIBC, IRON, RETICCTPCT,  in the last 72 hours  Coagulation profile  Recent Labs Lab 09/19/13 1539 09/19/13 2300  INR 1.39 1.39    No results found for this basename: DDIMER,  in the last 72 hours  Cardiac Enzymes  Recent Labs Lab 09/19/13 2300  TROPONINI <0.30   ------------------------------------------------------------------------------------------------------------------ No components found with this basename: POCBNP,      Time Spent in minutes   45   Lala Lund K M.D on 09/23/2013 at 11:19 AM  Between 7am to 7pm - Pager - [phone]  After 7pm go to [email or web address] - password [email or web address]  And look for the night coverage person covering for me after hours  [facility] Office  [phone]   **Disclaimer: This note may have been dictated with voice recognition software. Similar sounding words can inadvertently be transcribed and this note may contain transcription errors which may not have been corrected upon publication of note.**

## 2013-09-24 ENCOUNTER — Inpatient Hospital Stay (HOSPITAL_COMMUNITY): Payer: Medicare Other

## 2013-09-24 LAB — GLUCOSE, CAPILLARY
Glucose-Capillary: 112 mg/dL — ABNORMAL HIGH (ref 70–99)
Glucose-Capillary: 102 mg/dL — ABNORMAL HIGH (ref 70–99)
Glucose-Capillary: 127 mg/dL — ABNORMAL HIGH (ref 70–99)

## 2013-09-24 LAB — IRON AND TIBC
Iron: 117 ug/dL (ref 42–135)
UIBC: <15 ug/dL — ABNORMAL LOW (ref 125–400)

## 2013-09-24 LAB — CBC
HCT: 26.3 % — ABNORMAL LOW (ref 39.0–52.0)
HEMOGLOBIN: 9 g/dL — AB (ref 13.0–17.0)
MCH: 29.9 pg (ref 26.0–34.0)
MCHC: 34.2 g/dL (ref 30.0–36.0)
MCV: 87.4 fL (ref 78.0–100.0)
PLATELETS: 67 10*3/uL — AB (ref 150–400)
RBC: 3.01 MIL/uL — ABNORMAL LOW (ref 4.22–5.81)
RDW: 14.9 % (ref 11.5–15.5)
WBC: 19.5 10*3/uL — AB (ref 4.0–10.5)

## 2013-09-24 LAB — STOOL CULTURE

## 2013-09-24 LAB — CSF CULTURE W GRAM STAIN

## 2013-09-24 LAB — CSF CULTURE: Culture: NO GROWTH

## 2013-09-24 LAB — APTT: APTT: 63 s — AB (ref 24–37)

## 2013-09-24 MED ORDER — INFLUENZA VAC SPLIT QUAD 0.5 ML IM SUSY
0.5000 mL | PREFILLED_SYRINGE | INTRAMUSCULAR | Status: DC
  Filled 2013-09-24: qty 0.5

## 2013-09-24 MED ORDER — WHITE PETROLATUM GEL
Status: AC
  Administered 2013-09-24: 0.2
  Filled 2013-09-24: qty 5

## 2013-09-24 NOTE — Progress Notes (Signed)
Patient ID: William Stark, male   DOB: June 23, 1945, 68 y.o.   MRN: [id number]         [facility]    Date of Admission:  09/15/2013     Off all antimicrobial therapy  Principal Problem:   Severe sepsis Active Problems:   Hypovolemic shock   Diabetes 1.5, managed as type 2   Acute renal injury   Hyperkalemia   Elevated liver enzymes   Increased anion gap metabolic acidosis   Hypoglycemia   Cellulitis of right foot   Acute renal failure   Acute encephalopathy   Sepsis   Encephalopathy acute   Asterixis   Intermittent fever of unknown origin   DVT of lower extremity (deep venous thrombosis)   Thrombocytopenia   . antiseptic oral rinse  7 mL Mouth Rinse BID  . Influenza vac split quadrivalent PF  0.5 mL Intramuscular Tomorrow-1000  . insulin aspart  0-15 Units Subcutaneous TID WC  . insulin aspart  0-5 Units Subcutaneous QHS  . insulin aspart  3 Units Subcutaneous TID WC  . insulin glargine  10 Units Subcutaneous QHS    Subjective: He denies headache.  Objective: Temp:  [97.8 F (36.6 C)-99.3 F (37.4 C)] 98.6 F (37 C) (09/13 1009) Pulse Rate:  [98-112] 98 (09/13 1009) Resp:  [16-19] 18 (09/13 1009) BP: (99-124)/(60-68) 113/68 mmHg (09/13 1009) SpO2:  [94 %-100 %] 100 % (09/13 1009)  General: He is more alert Neck: Supple Lungs: Clear Cor: Regular S1 and S2 with no murmurs Abdomen: Soft and nontender  Lab Results Lab Results  Component Value Date   WBC 19.5* 09/24/2013   HGB 9.0* 09/24/2013   HCT 26.3* 09/24/2013   MCV 87.4 09/24/2013   PLT 67* 09/24/2013    Lab Results  Component Value Date   CREATININE 2.22* 09/23/2013   BUN 30* 09/23/2013   NA 145 09/23/2013   K 4.2 09/23/2013   CL 113* 09/23/2013   CO2 23 09/23/2013    Lab Results  Component Value Date   ALT 138* 09/22/2013   AST 93* 09/22/2013   ALKPHOS 343* 09/22/2013   BILITOT 0.8 09/22/2013      Microbiology: Recent Results (from the past 240 hour(s))  CULTURE,  BLOOD (ROUTINE X 2)     Status: None   Collection Time    09/15/13 10:15 AM      Result Value Ref Range Status   Specimen Description BLOOD LEFT HAND   Final   Special Requests BOTTLES DRAWN AEROBIC AND ANAEROBIC 10CC   Final   Culture  Setup Time     Final   Value: 09/15/2013 14:27     Performed at [facility]   Culture     Final   Value: NO GROWTH 5 DAYS     Performed at [facility]   Report Status 09/21/2013 FINAL   Final  CULTURE, BLOOD (ROUTINE X 2)     Status: None   Collection Time    09/15/13 10:30 AM      Result Value Ref Range Status   Specimen Description BLOOD LEFT HAND   Final   Special Requests BOTTLES DRAWN AEROBIC AND ANAEROBIC 10CC   Final   Culture  Setup Time     Final   Value: 09/15/2013 14:28     Performed at [facility]   Culture     Final   Value: NO GROWTH 5 DAYS     Performed at [facility]   Report Status 09/21/2013 FINAL   Final  MRSA PCR SCREENING     Status: None   Collection Time    09/15/13  4:45 PM      Result Value Ref Range Status   MRSA by PCR NEGATIVE  NEGATIVE Final   Comment:            The GeneXpert MRSA Assay (FDA     approved for NASAL specimens     only), is one component of a     comprehensive MRSA colonization     surveillance program. It is not     intended to diagnose MRSA     infection nor to guide or     monitor treatment for     MRSA infections.  CLOSTRIDIUM DIFFICILE BY PCR     Status: None   Collection Time    09/19/13  8:27 AM      Result Value Ref Range Status   C difficile by pcr NEGATIVE  NEGATIVE Final  STOOL CULTURE     Status: None   Collection Time    09/20/13  5:04 PM      Result Value Ref Range Status   Specimen Description STOOL   Final   Special Requests NONE   Final   Culture     Final   Value: NO SALMONELLA, SHIGELLA, CAMPYLOBACTER, YERSINIA, OR E.COLI [id number] ISOLATED     Note: REDUCED NORMAL FLORA PRESENT     Performed at [facility]   Report Status  09/24/2013 FINAL   Final  CULTURE, EXPECTORATED SPUTUM-ASSESSMENT     Status: None   Collection Time    09/21/13 11:41 AM      Result Value Ref Range Status   Specimen Description SPUTUM   Final   Special Requests NONE   Final   Sputum evaluation     Final   Value: THIS SPECIMEN IS ACCEPTABLE. RESPIRATORY CULTURE REPORT TO FOLLOW.   Report Status 09/21/2013 FINAL   Final  CULTURE, RESPIRATORY (NON-EXPECTORATED)     Status: None   Collection Time    09/21/13 11:41 AM      Result Value Ref Range Status   Specimen Description SPUTUM   Final   Special Requests NONE   Final   Gram Stain     Final   Value: RARE WBC PRESENT,BOTH PMN AND MONONUCLEAR     RARE SQUAMOUS EPITHELIAL CELLS PRESENT     NO ORGANISMS SEEN     Performed at [facility]   Culture     Final   Value: FEW CANDIDA ALBICANS     Performed at [facility]   Report Status 09/23/2013 FINAL   Final  CULTURE, BLOOD (ROUTINE X 2)     Status: None   Collection Time    09/21/13  3:54 PM      Result Value Ref Range Status   Specimen Description BLOOD RIGHT ARM   Final   Special Requests BOTTLES DRAWN AEROBIC AND ANAEROBIC 10CC   Final   Culture  Setup Time     Final   Value: 09/21/2013 19:21     Performed at [facility]   Culture     Final   Value:        BLOOD CULTURE RECEIVED NO GROWTH TO DATE CULTURE WILL BE HELD FOR 5 DAYS BEFORE ISSUING A FINAL NEGATIVE REPORT     Performed at [facility]   Report Status PENDING  Incomplete  CULTURE, BLOOD (ROUTINE X 2)     Status: None   Collection Time    09/21/13  4:05 PM      Result Value Ref Range Status   Specimen Description BLOOD RIGHT HAND   Final   Special Requests BOTTLES DRAWN AEROBIC ONLY 8CC   Final   Culture  Setup Time     Final   Value: 09/21/2013 19:18     Performed at [facility]   Culture     Final   Value:        BLOOD CULTURE RECEIVED NO GROWTH TO DATE CULTURE WILL BE HELD FOR 5 DAYS BEFORE ISSUING A FINAL NEGATIVE  REPORT     Performed at [facility]   Report Status PENDING   Incomplete  CSF CULTURE     Status: None   Collection Time    09/21/13  4:41 PM      Result Value Ref Range Status   Specimen Description CSF   Final   Special Requests 1.3ML CSF FLUID NO 2   Final   Gram Stain     Final   Value: WBC PRESENT, PREDOMINANTLY MONONUCLEAR     NO ORGANISMS SEEN     CYTOSPIN Performed at [facility]     Performed at [facility]   Culture     Final   Value: NO GROWTH 2 DAYS     Performed at [facility]   Report Status PENDING   Incomplete  GRAM STAIN     Status: None   Collection Time    09/21/13  4:41 PM      Result Value Ref Range Status   Specimen Description CSF   Final   Special Requests 1.3ML CSF FLUID   Final   Gram Stain     Final   Value: WBC PRESENT, PREDOMINANTLY MONONUCLEAR     NO ORGANISMS SEEN     CYTOSPIN SLIDE   Report Status 09/21/2013 FINAL   Final  AFB CULTURE WITH SMEAR     Status: None   Collection Time    09/21/13  4:43 PM      Result Value Ref Range Status   Specimen Description CSF   Final   Special Requests 1.3ML CSF FLUID   Final   Acid Fast Smear     Final   Value: NO ACID FAST BACILLI SEEN     Performed at [facility]   Culture     Final   Value: CULTURE WILL BE EXAMINED FOR 6 WEEKS BEFORE ISSUING A FINAL REPORT     Performed at [facility]   Report Status PENDING   Incomplete   CSF herpes simplex PCR: Negative  Assessment: He is now afebrile and improving slowly. All cultures remain negative. I would continue observation off of antibiotics.  Plan: 1. Continue observation off of antimicrobial agents 2. I will sign off now but please call if we can be of further assistance while he is here  Michel Bickers, [place] [facility] [place] [phone] pager   [phone] cell 09/24/2013, 11:01 AM

## 2013-09-24 NOTE — Progress Notes (Signed)
ANTICOAGULATION CONSULT NOTE - Follow Up Consult  Pharmacy Consult for Bivalirudin Indication: new LLE DVT and R/O HIT  No Known Allergies  Patient Measurements: Height: 6' (182.9 cm) Weight: 197 lb 8.5 oz (89.6 kg) IBW/kg (Calculated) : 77.6 Heparin Dosing Weight:   Vital Signs: Temp: 98.6 F (37 C) (09/13 1009) Temp src: Oral (09/13 1009) BP: 113/68 mmHg (09/13 1009) Pulse Rate: 98 (09/13 1009)  Labs:  Recent Labs  09/22/13 0430  09/22/13 2255 09/23/13 0422 09/24/13 0400  HGB 9.7*  --   --  9.3* 9.0*  HCT 29.2*  --   --  27.4* 26.3*  PLT 84*  --   --  70* 67*  APTT  --   < > 76* 73* 63*  HEPARINUNFRC 0.62  --   --   --   --   CREATININE 2.01*  --   --  2.22*  --   < > = values in this interval not displayed.  Estimated Creatinine Clearance: 35 ml/min (by C-G formula based on Cr of 2.22).   Medications:  Scheduled:  . antiseptic oral rinse  7 mL Mouth Rinse BID  . Influenza vac split quadrivalent PF  0.5 mL Intramuscular Tomorrow-1000  . insulin aspart  0-15 Units Subcutaneous TID WC  . insulin aspart  0-5 Units Subcutaneous QHS  . insulin aspart  3 Units Subcutaneous TID WC  . insulin glargine  10 Units Subcutaneous QHS    Assessment: 68yo male on Bivalirudin 0.05 mcg/kg/hr with therapeutic PTT 63 this AM.  Pltc 67- down again, but to a lesser degree.  No bleeding problems noted.  HIT panel pending.  Goal of Therapy:  aPTT 50-85 seconds Monitor platelets by anticoagulation protocol: Yes   Plan:  1-  Continue Bivalirudin at current rate 2-  F/U HIT results 3-  Watch for s/s of bleeding.  Gracy Bruins, PharmD Clinical Pharmacist [facility]

## 2013-09-24 NOTE — Progress Notes (Signed)
Patient Demographics  William Stark, is a 68 y.o. male, DOB - 04/10/1945, HTD:[id number]  Admit date - 09/15/2013   Admitting Physician Brand Males, MD  Outpatient Primary MD for the patient is [facility], MD  LOS - 9   Chief Complaint  Patient presents with  . Hypotension        Brief summary. 68 year old African American male with history of anxiety, type 2 diabetes mellitus and hypertension who was admitted to this hospital on 09/15/2013 by pulmonary critical care after he presented to the ER with altered mental status at that time thought to be secondary to right lower extremity cellulitis and sepsis. He was also found to be in acute renal failure with a creatinine of 10, hyperkalemia and hypotension along with pancytopenia and thrombocytopenia. He was started on vancomycin and Zosyn for suspected cellulitis, while in ICU there was suspicion of seizures and was seen by neurology, EEG and MRI were nonacute. He also underwent LP by pulmonary critical care which was not consistent with bacterial meningitis, ID is following the patient in controlling antibiotics. Also in ICU he had left lower extremity DVT, due to thrombocytopenia he has been started on Angiomax infusion.  He required intubation for one day between 9/8 and 09/20/2013, he continued to have fevers, he was placed on cooling blanket, had Foley catheter and a rectal tube and was transferred to the floor on 09/23/2048 under my service on day 8 of his hospital stay.    Subjective:   [name] today has, No headache, No chest pain, No abdominal pain - No Nausea, No new weakness tingling or numbness, No Cough - SOB.    Assessment & Plan     1. Encephalopathy. Questionable seizure activity while in ICU. Secondary to  infection of unclear source. Bacterial meningitis ruled out, seen by neurology and ID, CT scan, MRI brain and EEG nonacute. LP showed mild increase in lymphocytes however not very impressive. CSF Gram stain negative, HSV PCR is negative, RPR negative, ANA and ANCA negative, hepatitis and [facility] spotted fever serology negative, HIV non reactive, B12 stable, ammonia stable was slightly high on admission,  encephalopathy seems to be improving. He is awake and alert at this time. Acyclovir stopped by ID on 09/23/2013. He is currently being monitored off all antibiotics and acyclovir. Continues to have nonspecific leukocytosis but afebrile.  Ehrlichia  AB pending.    2. Chronic anxiety. Continue Xanax when necessary.    3. Right lower extremity DVT. Currently on Angiomax infusion due to severe thrombocytopenia.  Lab Results  Component Value Date   PLT 67* 09/24/2013     4. Pancytopenia. Question due to infection and sepsis unclear source, monitor with supportive care. Stable An. Panel & B12, request pathology to review smear. HIT panel pending.    5. Questionable seizure in ICU. Seen by neurology. EEG-CT/MRI nonacute. None procedure medications per neurology. Seizure free now.    6.DM2 - currently on Lantus along with sliding scale continue. Will check 1C.  Lab Results  Component Value Date   HGBA1C 6.4* 09/23/2013    CBG (last 3)   Recent Labs  09/23/13 1708 09/23/13 2114 09/24/13 0753  GLUCAP 191* 171* 112*     7.Smoker - counseled.  8.AKI on CKD 3 - had creatinine of of 10 upon admission, was thought to be secondary to dehydration and sepsis. With hydration has improved. Was seen initially by renal who has signed off. We'll monitor trend and avoid nephrotoxins.    9. Diarrhea. C. difficile negative. Discontinue rectal tube and monitor. If needed we'll place on Imodium.      10. Persistently elevated LFTs. Acute hepatitis panel negative, ammonia level was  slightly high upon admission, right upper quadrant ultrasound pending.      Code Status: Full  Family Communication: none present  Disposition Plan: TBD   Procedures    9/04 Renal US: no hydronephrosis  9/04 Renal Consult: AKI due to hypovolemic shock, worsened on an ACE-I and NSAIDS . Metabolic acidosis with elevated lactate. Sodium bicarbonate initiated  9/05 - 9/08 Renal function improving. Cr  9/06 Low grade fever  9/07 - 9/08 increasing fever and confusion  9/08 Facial twitching per RN. Concern for seizure  9/08 EEG: This EEG could be consistent with a moderate nonspecific generalized cerebral dysfunction with superimposed medication effect in the form of beta activity secondary previously administered Ativan. There is a possibility that the recurrent bursts of activity could be epileptiform in nature, but this is not definite by this study. Repeat study with Ativan administration may be helpful to differentiate  9/08 HIV negative  9/08 PM: Worsening AMS/agitation - improved with dexmedetomidine. Intubated for "respiratory distress".  9/08 CT head: NAD  9/09 early AM: Neurology consult - recommended repeat EEG with lorazepam administration and MRI brain  9/09 Passed SBT. Extubated. Encephalopathic. Asterixis noted. ? Delusional thinking. PRN Haldol ordered  9/10 Tolerating extubation. Cooperative. Oriented but personality is slightly bizarre (unclear if baseline). Occasional facial twitching. No convulsive episodes. Fevers persist. ID consult requested for fever of unclear etiology.  9/10 LE venous duplex: consistent with acute deep vein thrombosis involving the left posterial tibial vein  9/10 MRI brain: NAD  9/10 LP (Feinstein): 20 WBC, 96% lymphs, protein 95, glu 65, GS negative  9/11 Transfer to med-surg ordered. TRH to assume care as of AM 9/12 and PCCM to sign off  R IJ CVL 9/04 >> (left in due to poor peripheral targets)  ETT 9/08 >> 9/09 Right upper quadrant ultrasound  ordered 09/23/2013 Peripheral smear ordered 09/23/2013    Consults ID, Neuro, Renal, PCCM   Medications  Scheduled Meds: . antiseptic oral rinse  7 mL Mouth Rinse BID  . Influenza vac split quadrivalent PF  0.5 mL Intramuscular Tomorrow-1000  . insulin aspart  0-15 Units Subcutaneous TID WC  . insulin aspart  0-5 Units Subcutaneous QHS  . insulin aspart  3 Units Subcutaneous TID WC  . insulin glargine  10 Units Subcutaneous QHS   Continuous Infusions: . sodium chloride 10 mL/hr at 09/21/13 0600  . bivalirudin (ANGIOMAX) infusion 0.5 mg/mL (Non-ACS indications) 0.05 mg/kg/hr (09/22/13 1813)  . dextrose 5 % with KCl 20 mEq / L 20 mEq (09/23/13 1644)   PRN Meds:.ALPRAZolam, fentaNYL, loperamide, LORazepam, metoprolol, sodium chloride  DVT Prophylaxis    Angiomax  Lab Results  Component Value Date   PLT 67* 09/24/2013    Antibiotics     Anti-infectives   Start     Dose/Rate Route Frequency Ordered Stop   09/21/13 2100  acyclovir (ZOVIRAX) 775 mg in dextrose 5 % 150 mL IVPB  Status:  Discontinued     10 mg/kg  77.6 kg (Ideal) 165.5 mL/hr over 60 Minutes Intravenous Every 12 hours 09/21/13 2011  09/23/13 1609   09/19/13 2200  vancomycin (VANCOCIN) IVPB 750 mg/150 ml premix  Status:  Discontinued     750 mg 150 mL/hr over 60 Minutes Intravenous Every 12 hours 09/19/13 1043 09/19/13 2202   09/19/13 1000  vancomycin (VANCOCIN) IVPB 1000 mg/200 mL premix  Status:  Discontinued     1,000 mg 200 mL/hr over 60 Minutes Intravenous Every 24 hours 09/18/13 1216 09/19/13 1043   09/17/13 1100  piperacillin-tazobactam (ZOSYN) IVPB 3.375 g  Status:  Discontinued     3.375 g 12.5 mL/hr over 240 Minutes Intravenous 3 times per day 09/17/13 0922 09/20/13 1217   09/17/13 1000  vancomycin (VANCOCIN) IVPB 750 mg/150 ml premix  Status:  Discontinued     750 mg 150 mL/hr over 60 Minutes Intravenous Every 24 hours 09/17/13 0922 09/18/13 1216   09/15/13 1700  piperacillin-tazobactam (ZOSYN) IVPB  2.25 g  Status:  Discontinued     2.25 g 100 mL/hr over 30 Minutes Intravenous Every 6 hours 09/15/13 1354 09/17/13 0922   09/15/13 1000  vancomycin (VANCOCIN) 1,500 mg in sodium chloride 0.9 % 500 mL IVPB     1,500 mg 250 mL/hr over 120 Minutes Intravenous  Once 09/15/13 0959 09/15/13 1245   09/15/13 1000  piperacillin-tazobactam (ZOSYN) IVPB 3.375 g     3.375 g 100 mL/hr over 30 Minutes Intravenous  Once 09/15/13 0959 09/15/13 1057          Objective:   Filed Vitals:   09/23/13 0949 09/23/13 1748 09/23/13 2115 09/24/13 0535  BP: 131/68 124/67 99/60 109/65  Pulse: 97 101 112 102  Temp: 99.4 F (37.4 C) 97.8 F (36.6 C) 99.3 F (37.4 C) 98.9 F (37.2 C)  TempSrc: Oral Oral Oral Oral  Resp: 20 19 18 16   Height:      Weight:      SpO2: 98% 94% 94% 96%    Wt Readings from Last 3 Encounters:  09/19/13 89.6 kg (197 lb 8.5 oz)  08/15/13 95.255 kg (210 lb)     Intake/Output Summary (Last 24 hours) at 09/24/13 0928 Last data filed at 09/24/13 0634  Gross per 24 hour  Intake 1096.67 ml  Output   1475 ml  Net -378.33 ml     Physical Exam  Awake Alert, Oriented X 2, No new F.N deficits, Normal affect [place].AT,PERRAL Supple Neck,No JVD, No cervical lymphadenopathy appriciated. R IJ C line Symmetrical Chest wall movement, Good air movement bilaterally, CTAB RRR,No Gallops,Rubs or new Murmurs, No Parasternal Heave +ve B.Sounds, Abd Soft, No tenderness, No organomegaly appriciated, No rebound - guarding or rigidity. No Cyanosis, Clubbing or edema, No new Rash or bruise    Data Review   Micro Results  Results for LAWRNCE, REYEZ (MRN [id number]) as of 09/23/2013 11:18  Ref. Range 09/19/2013 10:40 09/19/2013 19:43 09/21/2013 15:00 09/21/2013 16:41  RPR Latest Range: NON REAC   NON REAC NON REAC   HSV 1 DNA Latest Range: Not Detected     Not Detected  HSV 2 DNA Latest Range: Not Detected     Not Detected  Specimen source hsv No range found    CSF  Hep A IgM Latest Range:  NON REACTIVE  NON REACTIVE     Hepatitis B Surface Ag Latest Range: NEGATIVE  NEGATIVE     Hep B C IgM Latest Range: NON REACTIVE  NON REACTIVE     HCV Ab Latest Range: NEGATIVE  NEGATIVE     RMSF IgG No range found  0.17   RMSF IgM Latest Range: 0.00-0.89 IV   0.09   HIV 1 RNA Quant Latest Range: <20 copies/mL   <20   HIV1 RNA Quant, Log Latest Range: <1.30 log 10   <1.30   HIV Latest Range: NONREACTIVE  NONREACTIVE        Recent Results (from the past 240 hour(s))  CULTURE, BLOOD (ROUTINE X 2)     Status: None   Collection Time    09/15/13 10:15 AM      Result Value Ref Range Status   Specimen Description BLOOD LEFT HAND   Final   Special Requests BOTTLES DRAWN AEROBIC AND ANAEROBIC 10CC   Final   Culture  Setup Time     Final   Value: 09/15/2013 14:27     Performed at [facility]   Culture     Final   Value: NO GROWTH 5 DAYS     Performed at [facility]   Report Status 09/21/2013 FINAL   Final  CULTURE, BLOOD (ROUTINE X 2)     Status: None   Collection Time    09/15/13 10:30 AM      Result Value Ref Range Status   Specimen Description BLOOD LEFT HAND   Final   Special Requests BOTTLES DRAWN AEROBIC AND ANAEROBIC 10CC   Final   Culture  Setup Time     Final   Value: 09/15/2013 14:28     Performed at [facility]   Culture     Final   Value: NO GROWTH 5 DAYS     Performed at [facility]   Report Status 09/21/2013 FINAL   Final  MRSA PCR SCREENING     Status: None   Collection Time    09/15/13  4:45 PM      Result Value Ref Range Status   MRSA by PCR NEGATIVE  NEGATIVE Final   Comment:            The GeneXpert MRSA Assay (FDA     approved for NASAL specimens     only), is one component of a     comprehensive MRSA colonization     surveillance program. It is not     intended to diagnose MRSA     infection nor to guide or     monitor treatment for     MRSA infections.  CLOSTRIDIUM DIFFICILE BY PCR     Status: None   Collection  Time    09/19/13  8:27 AM      Result Value Ref Range Status   C difficile by pcr NEGATIVE  NEGATIVE Final  STOOL CULTURE     Status: None   Collection Time    09/20/13  5:04 PM      Result Value Ref Range Status   Specimen Description STOOL   Final   Special Requests NONE   Final   Culture     Final   Value: NO SALMONELLA, SHIGELLA, CAMPYLOBACTER, YERSINIA, OR E.COLI 0157:H7 ISOLATED     Note: REDUCED NORMAL FLORA PRESENT     Performed at [facility]   Report Status 09/24/2013 FINAL   Final  CULTURE, EXPECTORATED SPUTUM-ASSESSMENT     Status: None   Collection Time    09/21/13 11:41 AM      Result Value Ref Range Status   Specimen Description SPUTUM   Final   Special Requests NONE   Final   Sputum evaluation  Final   Value: THIS SPECIMEN IS ACCEPTABLE. RESPIRATORY CULTURE REPORT TO FOLLOW.   Report Status 09/21/2013 FINAL   Final  CULTURE, RESPIRATORY (NON-EXPECTORATED)     Status: None   Collection Time    09/21/13 11:41 AM      Result Value Ref Range Status   Specimen Description SPUTUM   Final   Special Requests NONE   Final   Gram Stain     Final   Value: RARE WBC PRESENT,BOTH PMN AND MONONUCLEAR     RARE SQUAMOUS EPITHELIAL CELLS PRESENT     NO ORGANISMS SEEN     Performed at [facility]   Culture     Final   Value: FEW CANDIDA ALBICANS     Performed at [facility]   Report Status 09/23/2013 FINAL   Final  CULTURE, BLOOD (ROUTINE X 2)     Status: None   Collection Time    09/21/13  3:54 PM      Result Value Ref Range Status   Specimen Description BLOOD RIGHT ARM   Final   Special Requests BOTTLES DRAWN AEROBIC AND ANAEROBIC 10CC   Final   Culture  Setup Time     Final   Value: 09/21/2013 19:21     Performed at [facility]   Culture     Final   Value:        BLOOD CULTURE RECEIVED NO GROWTH TO DATE CULTURE WILL BE HELD FOR 5 DAYS BEFORE ISSUING A FINAL NEGATIVE REPORT     Performed at [facility]   Report Status  PENDING   Incomplete  CULTURE, BLOOD (ROUTINE X 2)     Status: None   Collection Time    09/21/13  4:05 PM      Result Value Ref Range Status   Specimen Description BLOOD RIGHT HAND   Final   Special Requests BOTTLES DRAWN AEROBIC ONLY 8CC   Final   Culture  Setup Time     Final   Value: 09/21/2013 19:18     Performed at [facility]   Culture     Final   Value:        BLOOD CULTURE RECEIVED NO GROWTH TO DATE CULTURE WILL BE HELD FOR 5 DAYS BEFORE ISSUING A FINAL NEGATIVE REPORT     Performed at [facility]   Report Status PENDING   Incomplete  CSF CULTURE     Status: None   Collection Time    09/21/13  4:41 PM      Result Value Ref Range Status   Specimen Description CSF   Final   Special Requests 1.3ML CSF FLUID NO 2   Final   Gram Stain     Final   Value: WBC PRESENT, PREDOMINANTLY MONONUCLEAR     NO ORGANISMS SEEN     CYTOSPIN Performed at [facility]     Performed at [facility]   Culture     Final   Value: NO GROWTH 2 DAYS     Performed at [facility]   Report Status PENDING   Incomplete  GRAM STAIN     Status: None   Collection Time    09/21/13  4:41 PM      Result Value Ref Range Status   Specimen Description CSF   Final   Special Requests 1.3ML CSF FLUID   Final   Gram Stain     Final   Value: WBC  PRESENT, PREDOMINANTLY MONONUCLEAR     NO ORGANISMS SEEN     CYTOSPIN SLIDE   Report Status 09/21/2013 FINAL   Final  AFB CULTURE WITH SMEAR     Status: None   Collection Time    09/21/13  4:43 PM      Result Value Ref Range Status   Specimen Description CSF   Final   Special Requests 1.3ML CSF FLUID   Final   Acid Fast Smear     Final   Value: NO ACID FAST BACILLI SEEN     Performed at [facility]   Culture     Final   Value: CULTURE WILL BE EXAMINED FOR 6 WEEKS BEFORE ISSUING A FINAL REPORT     Performed at [facility]   Report Status PENDING   Incomplete    Radiology Reports No results  found.  CBC  Recent Labs Lab 09/20/13 0500 09/21/13 0420 09/22/13 0430 09/23/13 0422 09/24/13 0400  WBC 24.6* 23.0* 18.8* 16.8* 19.5*  HGB 10.8* 11.3* 9.7* 9.3* 9.0*  HCT 32.4* 33.7* 29.2* 27.4* 26.3*  PLT 153 127* 84* 70* 67*  MCV 88.3 88.5 90.1 87.0 87.4  MCH 29.4 29.7 29.9 29.5 29.9  MCHC 33.3 33.5 33.2 33.9 34.2  RDW 15.1 15.3 15.2 15.0 14.9    Chemistries   Recent Labs Lab 09/18/13 0345  09/19/13 2300 09/20/13 0500 09/21/13 0420 09/22/13 0430 09/23/13 0422  NA 147  < > 150* 153* 150* 142 145  K 3.7  < > 3.4* 3.6* 3.5* 3.9 4.2  CL 112  < > 113* 116* 115* 110 113*  CO2 24  < > 24 24 25 23 23   GLUCOSE 172*  < > 93 125* 166* 228* 148*  BUN 45*  < > 30* 32* 33* 29* 30*  CREATININE 2.20*  < > 1.83* 1.79* 1.99* 2.01* 2.22*  CALCIUM 7.0*  < > 6.9* 6.8* 6.6* 6.5* 6.8*  AST 138*  --  136* 111* 84* 93*  --   ALT 455*  --  258* 240* 183* 138*  --   ALKPHOS 385*  --  459* 448* 450* 343*  --   BILITOT 1.7*  --  1.6* 1.3* 1.0 0.8  --   < > = values in this interval not displayed. ------------------------------------------------------------------------------------------------------------------ estimated creatinine clearance is 35 ml/min (by C-G formula based on Cr of 2.22). ------------------------------------------------------------------------------------------------------------------  Recent Labs  09/23/13 1230  HGBA1C 6.4*   ------------------------------------------------------------------------------------------------------------------ No results found for this basename: CHOL, HDL, LDLCALC, TRIG, CHOLHDL, LDLDIRECT,  in the last 72 hours ------------------------------------------------------------------------------------------------------------------ No results found for this basename: TSH, T4TOTAL, FREET3, T3FREE, THYROIDAB,  in the last 72  hours ------------------------------------------------------------------------------------------------------------------  Recent Labs  09/23/13 1230  VITAMINB12 862  694  FOLATE 7.1  FERRITIN 4208*  RETICCTPCT <0.4*    Coagulation profile  Recent Labs Lab 09/19/13 1539 09/19/13 2300  INR 1.39 1.39    No results found for this basename: DDIMER,  in the last 72 hours  Cardiac Enzymes  Recent Labs Lab 09/19/13 2300  TROPONINI <0.30   ------------------------------------------------------------------------------------------------------------------ No components found with this basename: POCBNP,      Time Spent in minutes   45   Lala Lund K M.D on 09/24/2013 at 9:28 AM  Between 7am to 7pm - Pager - [phone]  After 7pm go to [email or web address] - password [email or web address]  And look for the night coverage person covering for me after hours  [facility] Office  [phone]   **Disclaimer: This  note may have been dictated with voice recognition software. Similar sounding words can inadvertently be transcribed and this note may contain transcription errors which may not have been corrected upon publication of note.**

## 2013-09-25 ENCOUNTER — Inpatient Hospital Stay (HOSPITAL_COMMUNITY): Payer: Medicare Other

## 2013-09-25 LAB — CBC
HEMATOCRIT: 26.4 % — AB (ref 39.0–52.0)
Hemoglobin: 8.9 g/dL — ABNORMAL LOW (ref 13.0–17.0)
MCH: 29.7 pg (ref 26.0–34.0)
MCHC: 33.7 g/dL (ref 30.0–36.0)
MCV: 88 fL (ref 78.0–100.0)
PLATELETS: 118 10*3/uL — AB (ref 150–400)
RBC: 3 MIL/uL — ABNORMAL LOW (ref 4.22–5.81)
RDW: 14.9 % (ref 11.5–15.5)
WBC: 26.2 10*3/uL — ABNORMAL HIGH (ref 4.0–10.5)

## 2013-09-25 LAB — URINALYSIS, ROUTINE W REFLEX MICROSCOPIC
Bilirubin Urine: NEGATIVE
GLUCOSE, UA: NEGATIVE mg/dL
Ketones, ur: NEGATIVE mg/dL
Nitrite: NEGATIVE
Protein, ur: 30 mg/dL — AB
Specific Gravity, Urine: 1.009 (ref 1.005–1.030)
UROBILINOGEN UA: 0.2 mg/dL (ref 0.0–1.0)
pH: 7 (ref 5.0–8.0)

## 2013-09-25 LAB — BASIC METABOLIC PANEL
Anion gap: 10 (ref 5–15)
BUN: 21 mg/dL (ref 6–23)
CO2: 22 mEq/L (ref 19–32)
CREATININE: 1.85 mg/dL — AB (ref 0.50–1.35)
Calcium: 7 mg/dL — ABNORMAL LOW (ref 8.4–10.5)
Chloride: 115 mEq/L — ABNORMAL HIGH (ref 96–112)
GFR, EST AFRICAN AMERICAN: 41 mL/min — AB (ref >90)
GFR, EST NON AFRICAN AMERICAN: 36 mL/min — AB (ref >90)
GLUCOSE: 152 mg/dL — AB (ref 70–99)
Potassium: 3.9 mEq/L (ref 3.7–5.3)
Sodium: 147 mEq/L (ref 137–147)

## 2013-09-25 LAB — GLUCOSE, CAPILLARY
GLUCOSE-CAPILLARY: 147 mg/dL — AB (ref 70–99)
GLUCOSE-CAPILLARY: 124 mg/dL — AB (ref 70–99)
Glucose-Capillary: 70 mg/dL (ref 70–99)
Glucose-Capillary: 55 mg/dL — ABNORMAL LOW (ref 70–99)
Glucose-Capillary: 143 mg/dL — ABNORMAL HIGH (ref 70–99)

## 2013-09-25 LAB — URINE MICROSCOPIC-ADD ON

## 2013-09-25 LAB — APTT: aPTT: 67 seconds — ABNORMAL HIGH (ref 24–37)

## 2013-09-25 MED ORDER — RIVAROXABAN 15 MG PO TABS
15.0000 mg | ORAL_TABLET | Freq: Two times a day (BID) | ORAL | Status: DC
  Administered 2013-09-25 – 2013-09-28 (×8): 15 mg via ORAL
  Filled 2013-09-25 (×10): qty 1

## 2013-09-25 MED ORDER — RIVAROXABAN (XARELTO) EDUCATION KIT FOR DVT/PE PATIENTS
PACK | Freq: Once | Status: AC
  Administered 2013-09-25: 08:00:00
  Filled 2013-09-25: qty 1

## 2013-09-25 MED ORDER — SODIUM CHLORIDE 0.9 % IJ SOLN
10.0000 mL | INTRAMUSCULAR | Status: DC | PRN
  Administered 2013-09-26: 10 mL

## 2013-09-25 MED ORDER — RIVAROXABAN 20 MG PO TABS: 20.0000 mg | ORAL_TABLET | Freq: Every day | ORAL | Status: DC

## 2013-09-25 NOTE — Discharge Instructions (Signed)
Information on my medicine - XARELTO (rivaroxaban)  This medication education was reviewed with me or my healthcare representative as part of my discharge preparation.   WHY WAS XARELTO PRESCRIBED FOR YOU? Xarelto was prescribed to treat blood clots that may have been found in the veins of your legs (deep vein thrombosis) or in your lungs (pulmonary embolism) and to reduce the risk of them occurring again.  What do you need to know about Xarelto? The starting dose is one 15 mg tablet taken TWICE daily with food for the FIRST 21 DAYS then on 10/5 the dose is changed to one 20 mg tablet taken ONCE A DAY with your evening meal.  DO NOT stop taking Xarelto without talking to the health care provider who prescribed the medication.  Refill your prescription for 20 mg tablets before you run out.  After discharge, you should have regular check-up appointments with your healthcare provider that is prescribing your Xarelto.  In the future your dose may need to be changed if your kidney function changes by a significant amount.  What do you do if you miss a dose? If you are taking Xarelto TWICE DAILY and you miss a dose, take it as soon as you remember. You may take two 15 mg tablets (total 30 mg) at the same time then resume your regularly scheduled 15 mg twice daily the next day.  If you are taking Xarelto ONCE DAILY and you miss a dose, take it as soon as you remember on the same day then continue your regularly scheduled once daily regimen the next day. Do not take two doses of Xarelto at the same time.   Important Safety Information Xarelto is a blood thinner medicine that can cause bleeding. You should call your healthcare provider right away if you experience any of the following:   Bleeding from an injury or your nose that does not stop.   Unusual colored urine (red or dark brown) or unusual colored stools (red or black).   Unusual bruising for unknown reasons.   A serious fall or if  you hit your head (even if there is no bleeding).  Some medicines may interact with Xarelto and might increase your risk of bleeding while on Xarelto. To help avoid this, consult your healthcare provider or pharmacist prior to using any new prescription or non-prescription medications, including herbals, vitamins, non-steroidal anti-inflammatory drugs (NSAIDs) and supplements.  This website has more information on Xarelto: [email or web address]

## 2013-09-25 NOTE — Progress Notes (Signed)
Patient Demographics  William Stark, is a 68 y.o. male, DOB - May 23, 1945, TMH:[id number]  Admit date - 09/15/2013   Admitting Physician Brand Males, MD  Outpatient Primary MD for the patient is [facility], [place]  LOS - 10   Chief Complaint  Patient presents with  . Hypotension        Brief summary. 68 year old African American male with history of anxiety, type 2 diabetes mellitus and hypertension who was admitted to this hospital on 09/15/2013 by pulmonary critical care after he presented to the ER with altered mental status at that time thought to be secondary to right lower extremity cellulitis and sepsis. He was also found to be in acute renal failure with a creatinine of 10, hyperkalemia and hypotension along with pancytopenia and thrombocytopenia. He was started on vancomycin and Zosyn for suspected cellulitis, while in ICU there was suspicion of seizures and was seen by neurology, EEG and MRI were nonacute. He also underwent LP by pulmonary critical care which was not consistent with bacterial meningitis, ID is following the patient in controlling antibiotics. Also in ICU he had left lower extremity DVT, due to thrombocytopenia he has been started on Angiomax infusion.  He required intubation for one day between 9/8 and 09/20/2013, he continued to have fevers, he was placed on cooling blanket, had Foley catheter and a rectal tube and was transferred to the floor on 09/23/2048 under my service on day 8 of his hospital stay.    Subjective:   William Stark today has, No headache, No chest pain, No abdominal pain - No Nausea, No new weakness tingling or numbness, No Cough - SOB.    Assessment & Plan     1. Encephalopathy. Questionable seizure activity while in ICU. Secondary to  infection of unclear source. Bacterial meningitis ruled out, seen by neurology and ID, CT scan, MRI brain and EEG nonacute. LP showed mild increase in lymphocytes however not very impressive. CSF Gram stain negative, HSV PCR is negative, RPR negative, ANA and ANCA negative, hepatitis and [facility] spotted fever serology negative, HIV non reactive, B12 stable, ammonia stable was slightly high on admission,  encephalopathy seems to be improving. He is awake and alert at this time. Acyclovir stopped by ID on 09/23/2013. He is currently being monitored off all antibiotics and acyclovir. Continues to have nonspecific leukocytosis but afebrile.  Ehrlichia  AB pending.    2. Chronic anxiety. Continue Xanax when necessary.    3. Right lower extremity DVT. Was on on Angiomax infusion due to severe thrombocytopenia. Now platelet count better have placed on xaralto. HIT panel pending  Lab Results  Component Value Date   PLT 118* 09/25/2013     4. Pancytopenia. Question due to infection and sepsis unclear source, monitor with supportive care. Stable An. Panel & B12, request pathology to review smear. HIT panel pending.    5. Questionable seizure in ICU. Seen by neurology. EEG-CT/MRI nonacute. None procedure medications per neurology. Seizure free now.    6.DM2 - currently on Lantus along with sliding scale , will drop Lantus dose.. Noted A1c.  Lab Results  Component Value Date   HGBA1C 6.4* 09/23/2013    CBG (last 3)   Recent Labs  09/24/13 1638 09/24/13 2134  09/25/13 0730  GLUCAP 70 127* 147*     7.Smoker - counseled.    8.AKI on CKD 3 - had creatinine of of 10 upon admission, was thought to be secondary to dehydration and sepsis. With hydration has improved. Was seen initially by renal who has signed off. We'll monitor trend and avoid nephrotoxins.    9. Diarrhea. C. difficile negative. Discontinue rectal tube and monitor. This has leukocytosis again would repeat C.  difficile.      10. Persistently elevated LFTs. Acute hepatitis panel negative, ammonia level was slightly high upon admission , stable right upper quadrant ultrasound, status post cholecystectomy.    11. Leukocytosis worsening on 09/25/2013 with low-grade fevers again. Suspect right IJ central line to be an issue as it's getting close to being 18 days old, we fail to obtain a peripheral access several times, will try for PICC line and remove central line. We'll also repeat 2 view chest x-ray, UA and blood cultures. Rule out C. difficile again.     Code Status: Full  Family Communication: none present  Disposition Plan: TBD   Procedures    9/04 Renal US: no hydronephrosis  9/04 Renal Consult: AKI due to hypovolemic shock, worsened on an ACE-I and NSAIDS . Metabolic acidosis with elevated lactate. Sodium bicarbonate initiated  9/05 - 9/08 Renal function improving. Cr  9/06 Low grade fever  9/07 - 9/08 increasing fever and confusion  9/08 Facial twitching per RN. Concern for seizure  9/08 EEG: This EEG could be consistent with a moderate nonspecific generalized cerebral dysfunction with superimposed medication effect in the form of beta activity secondary previously administered Ativan. There is a possibility that the recurrent bursts of activity could be epileptiform in nature, but this is not definite by this study. Repeat study with Ativan administration may be helpful to differentiate  9/08 HIV negative  9/08 PM: Worsening AMS/agitation - improved with dexmedetomidine. Intubated for "respiratory distress".  9/08 CT head: NAD  9/09 early AM: Neurology consult - recommended repeat EEG with lorazepam administration and MRI brain  9/09 Passed SBT. Extubated. Encephalopathic. Asterixis noted. ? Delusional thinking. PRN Haldol ordered  9/10 Tolerating extubation. Cooperative. Oriented but personality is slightly bizarre (unclear if baseline). Occasional facial twitching. No convulsive  episodes. Fevers persist. ID consult requested for fever of unclear etiology.  9/10 LE venous duplex: consistent with acute deep vein thrombosis involving the left posterial tibial vein  9/10 MRI brain: NAD  9/10 LP (Feinstein): 20 WBC, 96% lymphs, protein 95, glu 65, GS negative  9/11 Transfer to med-surg ordered. TRH to assume care as of AM 9/12 and PCCM to sign off  R IJ CVL 9/04 >> (left in due to poor peripheral targets)  ETT 9/08 >> 9/09 Right upper quadrant / 09/23/2013 Peripheral smear ordered 09/23/2013    Consults ID, Neuro, Renal, PCCM   Medications  Scheduled Meds: . antiseptic oral rinse  7 mL Mouth Rinse BID  . Influenza vac split quadrivalent PF  0.5 mL Intramuscular Tomorrow-1000  . insulin aspart  0-15 Units Subcutaneous TID WC  . insulin aspart  0-5 Units Subcutaneous QHS  . insulin aspart  3 Units Subcutaneous TID WC  . insulin glargine  10 Units Subcutaneous QHS  . rivaroxaban  15 mg Oral BID WC   Followed by  . [START ON 10/16/2013] rivaroxaban  20 mg Oral Q supper   Continuous Infusions: . sodium chloride 10 mL/hr at 09/21/13 0600   PRN Meds:.ALPRAZolam, fentaNYL, LORazepam, metoprolol, sodium chloride  DVT Prophylaxis    Xaralto  Lab Results  Component Value Date   PLT 118* 09/25/2013    Antibiotics     Anti-infectives   Start     Dose/Rate Route Frequency Ordered Stop   09/21/13 2100  acyclovir (ZOVIRAX) 775 mg in dextrose 5 % 150 mL IVPB  Status:  Discontinued     10 mg/kg  77.6 kg (Ideal) 165.5 mL/hr over 60 Minutes Intravenous Every 12 hours 09/21/13 2011 09/23/13 1609   09/19/13 2200  vancomycin (VANCOCIN) IVPB 750 mg/150 ml premix  Status:  Discontinued     750 mg 150 mL/hr over 60 Minutes Intravenous Every 12 hours 09/19/13 1043 09/19/13 2202   09/19/13 1000  vancomycin (VANCOCIN) IVPB 1000 mg/200 mL premix  Status:  Discontinued     1,000 mg 200 mL/hr over 60 Minutes Intravenous Every 24 hours 09/18/13 1216 09/19/13 1043   09/17/13  1100  piperacillin-tazobactam (ZOSYN) IVPB 3.375 g  Status:  Discontinued     3.375 g 12.5 mL/hr over 240 Minutes Intravenous 3 times per day 09/17/13 0922 09/20/13 1217   09/17/13 1000  vancomycin (VANCOCIN) IVPB 750 mg/150 ml premix  Status:  Discontinued     750 mg 150 mL/hr over 60 Minutes Intravenous Every 24 hours 09/17/13 0922 09/18/13 1216   09/15/13 1700  piperacillin-tazobactam (ZOSYN) IVPB 2.25 g  Status:  Discontinued     2.25 g 100 mL/hr over 30 Minutes Intravenous Every 6 hours 09/15/13 1354 09/17/13 0922   09/15/13 1000  vancomycin (VANCOCIN) 1,500 mg in sodium chloride 0.9 % 500 mL IVPB     1,500 mg 250 mL/hr over 120 Minutes Intravenous  Once 09/15/13 0959 09/15/13 1245   09/15/13 1000  piperacillin-tazobactam (ZOSYN) IVPB 3.375 g     3.375 g 100 mL/hr over 30 Minutes Intravenous  Once 09/15/13 0959 09/15/13 1057          Objective:   Filed Vitals:   09/24/13 1813 09/24/13 2135 09/25/13 0555 09/25/13 0958  BP: 100/72 100/73 119/84 116/65  Pulse: 112 110 112 112  Temp: 98.5 F (36.9 C) 98.8 F (37.1 C) 98.6 F (37 C) 100.3 F (37.9 C)  TempSrc: Oral Oral Oral Oral  Resp: 19 18 18 16   Height:      Weight:  88.2 kg (194 lb 7.1 oz)    SpO2: 96% 98% 99% 96%    Wt Readings from Last 3 Encounters:  09/24/13 88.2 kg (194 lb 7.1 oz)  08/15/13 95.255 kg (210 lb)     Intake/Output Summary (Last 24 hours) at 09/25/13 1119 Last data filed at 09/25/13 0600  Gross per 24 hour  Intake 2133.33 ml  Output   1780 ml  Net 353.33 ml     Physical Exam  Awake Alert, Oriented X 2, No new F.N deficits, Normal affect [place].AT,PERRAL Supple Neck,No JVD, No cervical lymphadenopathy appriciated. R IJ C line Symmetrical Chest wall movement, Good air movement bilaterally, CTAB RRR,No Gallops,Rubs or new Murmurs, No Parasternal Heave +ve B.Sounds, Abd Soft, No tenderness, No organomegaly appriciated, No rebound - guarding or rigidity. No Cyanosis, Clubbing or edema, No new  Rash or bruise    Data Review   Micro Results  Results for LADARRIUS, BOGDANSKI (MRN [id number]) as of 09/23/2013 11:18  Ref. Range 09/19/2013 10:40 09/19/2013 19:43 09/21/2013 15:00 09/21/2013 16:41  RPR Latest Range: NON REAC   NON REAC NON REAC   HSV 1 DNA Latest Range: Not Detected     Not Detected  HSV 2 DNA Latest Range: Not Detected     Not Detected  Specimen source hsv No range found    CSF  Hep A IgM Latest Range: NON REACTIVE  NON REACTIVE     Hepatitis B Surface Ag Latest Range: NEGATIVE  NEGATIVE     Hep B C IgM Latest Range: NON REACTIVE  NON REACTIVE     HCV Ab Latest Range: NEGATIVE  NEGATIVE     RMSF IgG No range found   0.17   RMSF IgM Latest Range: 0.00-0.89 IV   0.09   HIV 1 RNA Quant Latest Range: <20 copies/mL   <20   HIV1 RNA Quant, Log Latest Range: <1.30 log 10   <1.30   HIV Latest Range: NONREACTIVE  NONREACTIVE        Recent Results (from the past 240 hour(s))  MRSA PCR SCREENING     Status: None   Collection Time    09/15/13  4:45 PM      Result Value Ref Range Status   MRSA by PCR NEGATIVE  NEGATIVE Final   Comment:            The GeneXpert MRSA Assay (FDA     approved for NASAL specimens     only), is one component of a     comprehensive MRSA colonization     surveillance program. It is not     intended to diagnose MRSA     infection nor to guide or     monitor treatment for     MRSA infections.  CLOSTRIDIUM DIFFICILE BY PCR     Status: None   Collection Time    09/19/13  8:27 AM      Result Value Ref Range Status   C difficile by pcr NEGATIVE  NEGATIVE Final  STOOL CULTURE     Status: None   Collection Time    09/20/13  5:04 PM      Result Value Ref Range Status   Specimen Description STOOL   Final   Special Requests NONE   Final   Culture     Final   Value: NO SALMONELLA, SHIGELLA, CAMPYLOBACTER, YERSINIA, OR E.COLI 0157:H7 ISOLATED     Note: REDUCED NORMAL FLORA PRESENT     Performed at [facility]   Report Status 09/24/2013  FINAL   Final  CULTURE, EXPECTORATED SPUTUM-ASSESSMENT     Status: None   Collection Time    09/21/13 11:41 AM      Result Value Ref Range Status   Specimen Description SPUTUM   Final   Special Requests NONE   Final   Sputum evaluation     Final   Value: THIS SPECIMEN IS ACCEPTABLE. RESPIRATORY CULTURE REPORT TO FOLLOW.   Report Status 09/21/2013 FINAL   Final  CULTURE, RESPIRATORY (NON-EXPECTORATED)     Status: None   Collection Time    09/21/13 11:41 AM      Result Value Ref Range Status   Specimen Description SPUTUM   Final   Special Requests NONE   Final   Gram Stain     Final   Value: RARE WBC PRESENT,BOTH PMN AND MONONUCLEAR     RARE SQUAMOUS EPITHELIAL CELLS PRESENT     NO ORGANISMS SEEN     Performed at [facility]   Culture     Final   Value: FEW CANDIDA ALBICANS     Performed at [facility]   Report Status 09/23/2013 FINAL  Final  CULTURE, BLOOD (ROUTINE X 2)     Status: None   Collection Time    09/21/13  3:54 PM      Result Value Ref Range Status   Specimen Description BLOOD RIGHT ARM   Final   Special Requests BOTTLES DRAWN AEROBIC AND ANAEROBIC 10CC   Final   Culture  Setup Time     Final   Value: 09/21/2013 19:21     Performed at [facility]   Culture     Final   Value:        BLOOD CULTURE RECEIVED NO GROWTH TO DATE CULTURE WILL BE HELD FOR 5 DAYS BEFORE ISSUING A FINAL NEGATIVE REPORT     Performed at [facility]   Report Status PENDING   Incomplete  CULTURE, BLOOD (ROUTINE X 2)     Status: None   Collection Time    09/21/13  4:05 PM      Result Value Ref Range Status   Specimen Description BLOOD RIGHT HAND   Final   Special Requests BOTTLES DRAWN AEROBIC ONLY 8CC   Final   Culture  Setup Time     Final   Value: 09/21/2013 19:18     Performed at [facility]   Culture     Final   Value:        BLOOD CULTURE RECEIVED NO GROWTH TO DATE CULTURE WILL BE HELD FOR 5 DAYS BEFORE ISSUING A FINAL NEGATIVE REPORT      Performed at [facility]   Report Status PENDING   Incomplete  CSF CULTURE     Status: None   Collection Time    09/21/13  4:41 PM      Result Value Ref Range Status   Specimen Description CSF   Final   Special Requests 1.3ML CSF FLUID NO 2   Final   Gram Stain     Final   Value: WBC PRESENT, PREDOMINANTLY MONONUCLEAR     NO ORGANISMS SEEN     CYTOSPIN Performed at [facility]     Performed at [facility]   Culture     Final   Value: NO GROWTH 3 DAYS     Performed at [facility]   Report Status 09/24/2013 FINAL   Final  GRAM STAIN     Status: None   Collection Time    09/21/13  4:41 PM      Result Value Ref Range Status   Specimen Description CSF   Final   Special Requests 1.3ML CSF FLUID   Final   Gram Stain     Final   Value: WBC PRESENT, PREDOMINANTLY MONONUCLEAR     NO ORGANISMS SEEN     CYTOSPIN SLIDE   Report Status 09/21/2013 FINAL   Final  AFB CULTURE WITH SMEAR     Status: None   Collection Time    09/21/13  4:43 PM      Result Value Ref Range Status   Specimen Description CSF   Final   Special Requests 1.3ML CSF FLUID   Final   Acid Fast Smear     Final   Value: NO ACID FAST BACILLI SEEN     Performed at [facility]   Culture     Final   Value: CULTURE WILL BE EXAMINED FOR 6 WEEKS BEFORE ISSUING A FINAL REPORT     Performed at [facility]   Report Status PENDING  Incomplete    Radiology Reports US Abdomen Limited Ruq  09/24/2013   CLINICAL DATA:  Elevated liver function tests.  EXAM: US ABDOMEN LIMITED - RIGHT UPPER QUADRANT  COMPARISON:  No priors.  FINDINGS: Gallbladder:  Status post cholecystectomy.  Common bile duct:  Diameter: 5 mm in the porta hepatis.  Liver:  No focal lesion identified. Within normal limits in parenchymal echogenicity.  Other Findings:  Small right pleural effusion.  IMPRESSION: 1. No acute findings in the abdomen. 2. Small right pleural effusion. 3. Status post cholecystectomy.    Electronically Signed   By: Vinnie Langton M.D.   On: 09/24/2013 14:47    CBC  Recent Labs Lab 09/21/13 0420 09/22/13 0430 09/23/13 0422 09/24/13 0400 09/25/13 0519  WBC 23.0* 18.8* 16.8* 19.5* 26.2*  HGB 11.3* 9.7* 9.3* 9.0* 8.9*  HCT 33.7* 29.2* 27.4* 26.3* 26.4*  PLT 127* 84* 70* 67* 118*  MCV 88.5 90.1 87.0 87.4 88.0  MCH 29.7 29.9 29.5 29.9 29.7  MCHC 33.5 33.2 33.9 34.2 33.7  RDW 15.3 15.2 15.0 14.9 14.9    Chemistries   Recent Labs Lab 09/19/13 2300 09/20/13 0500 09/21/13 0420 09/22/13 0430 09/23/13 0422 09/25/13 0519  NA 150* 153* 150* 142 145 147  K 3.4* 3.6* 3.5* 3.9 4.2 3.9  CL 113* 116* 115* 110 113* 115*  CO2 24 24 25 23 23 22   GLUCOSE 93 125* 166* 228* 148* 152*  BUN 30* 32* 33* 29* 30* 21  CREATININE 1.83* 1.79* 1.99* 2.01* 2.22* 1.85*  CALCIUM 6.9* 6.8* 6.6* 6.5* 6.8* 7.0*  AST 136* 111* 84* 93*  --   --   ALT 258* 240* 183* 138*  --   --   ALKPHOS 459* 448* 450* 343*  --   --   BILITOT 1.6* 1.3* 1.0 0.8  --   --    ------------------------------------------------------------------------------------------------------------------ estimated creatinine clearance is 41.9 ml/min (by C-G formula based on Cr of 1.85). ------------------------------------------------------------------------------------------------------------------  Recent Labs  09/23/13 1230  HGBA1C 6.4*   ------------------------------------------------------------------------------------------------------------------ No results found for this basename: CHOL, HDL, LDLCALC, TRIG, CHOLHDL, LDLDIRECT,  in the last 72 hours ------------------------------------------------------------------------------------------------------------------ No results found for this basename: TSH, T4TOTAL, FREET3, T3FREE, THYROIDAB,  in the last 72 hours ------------------------------------------------------------------------------------------------------------------  Recent Labs  09/23/13 1230    VITAMINB12 862  694  FOLATE 7.1  FERRITIN 4208*  TIBC NOT CALC  IRON 117  RETICCTPCT <0.4*    Coagulation profile  Recent Labs Lab 09/19/13 1539 09/19/13 2300  INR 1.39 1.39    No results found for this basename: DDIMER,  in the last 72 hours  Cardiac Enzymes  Recent Labs Lab 09/19/13 2300  TROPONINI <0.30   ------------------------------------------------------------------------------------------------------------------ No components found with this basename: POCBNP,      Time Spent in minutes   45   Lala Lund K M.D on 09/25/2013 at 11:19 AM  Between 7am to 7pm - Pager - [phone]  After 7pm go to [email or web address] - password [email or web address]  And look for the night coverage person covering for me after hours  [facility] Office  [phone]   **Disclaimer: This note may have been dictated with voice recognition software. Similar sounding words can inadvertently be transcribed and this note may contain transcription errors which may not have been corrected upon publication of note.**

## 2013-09-25 NOTE — Progress Notes (Signed)
Patient refusing bed alarm this evening shift.  Re-educated patient on importance of utilizing bed alarm; patient still refused.Bed in lowest position.  Emphasized use of call bell if needed to use the bathroom; verbalized understanding.  Will continue to monitor patient. 

## 2013-09-25 NOTE — Progress Notes (Signed)
Physical Therapy Treatment Patient Details Name: William Stark MRN: [id number] DOB: 09/07/1945 Today's Date: 09/25/2013    History of Present Illness Patient is a 68 y/o male admitted with AMS, hypotension and renal failure. CHest XRAY- Bibasilar atelectasis. Pt with long complicated hospital course - found to be in acute renal failure with a creatinine of 10, hyperkalemic and with hypotension along with pancytopenia and thrombocytopenia. He was started on vancomycin and Zosyn for suspected cellulitis, while in ICU there was suspicion of seizures and pt was seen by neurology, EEG and MRI were nonacute. He also underwent LP by pulmonary critical care which was not consistent with bacterial meningitis. Found to have DVT (+) LLE. Required intubation for one day between 9/8 and 09/20/2013    PT Comments    Tachycardia continues to limit activity tolerance; Pt was sitting EOB when I entered, trying to use the urinal, and his HR was in the 130s per Cardiac Monitoring, so we opted to limit session to sidesteps to [facility] for optimal positioning back in the bed  Pt slow to answer questions today, and slow to respond to instructions; discussed with RN, who indicated his mental status seems to wax and wane    Follow Up Recommendations  SNF;Supervision/Assistance - 24 hour     Equipment Recommendations  Rolling walker with 5" wheels    Recommendations for Other Services       Precautions / Restrictions Precautions Precautions: Fall    Mobility  Bed Mobility Overal bed mobility: Needs Assistance Bed Mobility: Sit to Supine       Sit to supine: Min assist   General bed mobility comments: min assist for LEs back onto bed;   Transfers Overall transfer level: Needs assistance Equipment used: 1 person hand held assist Transfers: Sit to/from Stand Sit to Stand: Mod assist         General transfer comment: Heavy mod assist to stand from low bed; Very slow moving this  afternoon  Ambulation/Gait Ambulation/Gait assistance: Mod assist Ambulation Distance (Feet):  (sidesteps toward [facility]) Assistive device: 1 person hand held assist       General Gait Details: Mod A for balance. Ambulated sidesteps to [facility]; uncoordinated steps. Uncontrolled descent into chair. Increased shakiness noted in Bil knees during stepping;  Distance limited due to elevated HR.   Stairs            Wheelchair Mobility    Modified Rankin (Stroke Patients Only)       Balance             Standing balance-Leahy Scale: Poor Standing balance comment: Continues to require UE support                    Cognition Arousal/Alertness: Lethargic Behavior During Therapy: WFL for tasks assessed/performed Overall Cognitive Status: No family/caregiver present to determine baseline cognitive functioning                      Exercises      General Comments General comments (skin integrity, edema, etc.): Pt did mention that his bottom hurt when in the bed, so ended session with pt R semi-sidelying      Pertinent Vitals/Pain Pain Assessment: No/denies pain    Home Living                      Prior Function            PT Goals (current goals can now  be found in the care plan section) Acute Rehab PT Goals Patient Stated Goal: did not state Progress towards PT goals: Progressing toward goals (quite slowly; tachycardia limiting)    Frequency  Min 3X/week    PT Plan Current plan remains appropriate    Co-evaluation             End of Session Equipment Utilized During Treatment: Gait belt Activity Tolerance: Patient limited by fatigue Patient left: in bed;with call bell/phone within reach (in R semi-sidelying)     Time: 1556-1610 PT Time Calculation (min): 14 min  Charges:  $Therapeutic Activity: 8-22 mins                    G Codes:      Quin Hoop 09/25/2013, 4:49 PM  Roney Marion, [place] Pager [phone] Office [phone]

## 2013-09-25 NOTE — Progress Notes (Signed)
ANTICOAGULATION CONSULT NOTE - Initial Consult  Pharmacy Consult for Xarelto Indication: DVT  No Known Allergies  Patient Measurements: Height: 6' (182.9 cm) Weight: 194 lb 7.1 oz (88.2 kg) IBW/kg (Calculated) : 77.6  Vital Signs: Temp: 98.6 F (37 C) (09/14 0555) Temp src: Oral (09/14 0555) BP: 119/84 mmHg (09/14 0555) Pulse Rate: 112 (09/14 0555)  Labs:  Recent Labs  09/23/13 0422 09/24/13 0400 09/25/13 0519  HGB 9.3* 9.0* 8.9*  HCT 27.4* 26.3* 26.4*  PLT 70* 67* 118*  APTT 73* 63* 67*  CREATININE 2.22*  --  1.85*    Estimated Creatinine Clearance: 41.9 ml/min (by C-G formula based on Cr of 1.85).   Medical History: Past Medical History  Diagnosis Date  . Hypertension   . Diabetes mellitus without complication   . Cancer     Medications:  Prescriptions prior to admission  Medication Sig Dispense Refill  . ALPRAZolam (XANAX) 1 MG tablet Take 1 tablet by mouth 3 (three) times daily as needed for anxiety.       Marland Kitchen glipiZIDE (GLUCOTROL XL) 5 MG 24 hr tablet Take 5 mg by mouth daily with breakfast.      . hydrochlorothiazide (HYDRODIURIL) 25 MG tablet Take 25 mg by mouth daily.      Marland Kitchen ibuprofen (ADVIL,MOTRIN) 200 MG tablet Take 400 mg by mouth every 8 (eight) hours as needed for moderate pain.      Marland Kitchen lisinopril (PRINIVIL,ZESTRIL) 10 MG tablet Take 10 mg by mouth daily.      . metFORMIN (GLUCOPHAGE) 1000 MG tablet Take 1,000 mg by mouth daily.       . cefUROXime (CEFTIN) 500 MG tablet Take 500 mg by mouth 2 (two) times daily with a meal. For 14 days. Starting 8/20.       Scheduled:  . antiseptic oral rinse  7 mL Mouth Rinse BID  . Influenza vac split quadrivalent PF  0.5 mL Intramuscular Tomorrow-1000  . insulin aspart  0-15 Units Subcutaneous TID WC  . insulin aspart  0-5 Units Subcutaneous QHS  . insulin aspart  3 Units Subcutaneous TID WC  . insulin glargine  10 Units Subcutaneous QHS  . rivaroxaban   Does not apply Once  . rivaroxaban  15 mg Oral BID WC    Followed by  . [START ON 10/16/2013] rivaroxaban  20 mg Oral Q supper   Infusions:  . sodium chloride 10 mL/hr at 09/21/13 0600  . dextrose 5 % with KCl 20 mEq / L 20 mEq (09/25/13 0730)    Assessment: 68yo male has been on Angiomax for DVT, plt now up, to transition to Xarelto.    Plan:  Will start Xarelto 15mg  PO BID x21d followed by 20mg  daily and begin Xarelto education.  Wynona Neat, PharmD, BCPS  09/25/2013,7:40 AM

## 2013-09-26 LAB — BASIC METABOLIC PANEL
Anion gap: 10 (ref 5–15)
BUN: 21 mg/dL (ref 6–23)
CO2: 21 mEq/L (ref 19–32)
Calcium: 7.3 mg/dL — ABNORMAL LOW (ref 8.4–10.5)
Chloride: 118 mEq/L — ABNORMAL HIGH (ref 96–112)
Creatinine, Ser: 2.02 mg/dL — ABNORMAL HIGH (ref 0.50–1.35)
GFR calc Af Amer: 37 mL/min — ABNORMAL LOW (ref >90)
GFR calc non Af Amer: 32 mL/min — ABNORMAL LOW (ref >90)
Glucose, Bld: 72 mg/dL (ref 70–99)
Potassium: 3.9 mEq/L (ref 3.7–5.3)
Sodium: 149 mEq/L — ABNORMAL HIGH (ref 137–147)

## 2013-09-26 LAB — CBC
HCT: 25.6 % — ABNORMAL LOW (ref 39.0–52.0)
Hemoglobin: 8.8 g/dL — ABNORMAL LOW (ref 13.0–17.0)
MCH: 30.2 pg (ref 26.0–34.0)
MCHC: 34.4 g/dL (ref 30.0–36.0)
MCV: 88 fL (ref 78.0–100.0)
Platelets: 203 10*3/uL (ref 150–400)
RBC: 2.91 MIL/uL — ABNORMAL LOW (ref 4.22–5.81)
RDW: 14.9 % (ref 11.5–15.5)
WBC: 19.6 10*3/uL — ABNORMAL HIGH (ref 4.0–10.5)

## 2013-09-26 LAB — HEPARIN INDUCED THROMBOCYTOPENIA PNL
HEPARIN INDUCED PLT AB: NEGATIVE
Patient O.D.: 0.022
UFH High Dose UFH H: 7 % Release
UFH LOW DOSE 0.1 IU/ML: 5 %
UFH Low Dose 0.5 IU/mL: 4 % Release
UFH SRA Result: NEGATIVE

## 2013-09-26 LAB — GLUCOSE, CAPILLARY
GLUCOSE-CAPILLARY: 128 mg/dL — AB (ref 70–99)
GLUCOSE-CAPILLARY: 161 mg/dL — AB (ref 70–99)
GLUCOSE-CAPILLARY: 103 mg/dL — AB (ref 70–99)

## 2013-09-26 LAB — APTT: aPTT: 47 seconds — ABNORMAL HIGH (ref 24–37)

## 2013-09-26 MED ORDER — FENTANYL CITRATE 0.05 MG/ML IJ SOLN: 25.0000 ug | INTRAMUSCULAR | Status: DC | PRN

## 2013-09-26 MED ORDER — INSULIN GLARGINE 100 UNIT/ML ~~LOC~~ SOLN
8.0000 [IU] | Freq: Every day | SUBCUTANEOUS | Status: DC
  Administered 2013-09-26 – 2013-09-27 (×2): 8 [IU] via SUBCUTANEOUS
  Filled 2013-09-26 (×3): qty 0.08

## 2013-09-26 MED ORDER — DEXTROSE 5 % IV SOLN
INTRAVENOUS | Status: AC
  Administered 2013-09-26 – 2013-09-27 (×3): via INTRAVENOUS

## 2013-09-26 MED ORDER — CARVEDILOL 3.125 MG PO TABS
3.1250 mg | ORAL_TABLET | Freq: Two times a day (BID) | ORAL | Status: DC
  Administered 2013-09-26 – 2013-09-28 (×5): 3.125 mg via ORAL
  Filled 2013-09-26 (×8): qty 1

## 2013-09-26 MED ORDER — SODIUM CHLORIDE 0.9 % IV BOLUS (SEPSIS)
500.0000 mL | Freq: Once | INTRAVENOUS | Status: AC
  Administered 2013-09-26: 500 mL via INTRAVENOUS

## 2013-09-26 NOTE — Progress Notes (Addendum)
Patient Demographics  William Stark, is a 68 y.o. male, DOB - 03-Nov-1945, ZOX:[id number]  Admit date - 09/15/2013   Admitting Physician Brand Males, MD  Outpatient Primary MD for the patient is [facility], [place]  LOS - 11   Chief Complaint  Patient presents with  . Hypotension        Brief summary. 68 year old African American male with history of anxiety, type 2 diabetes mellitus and hypertension who was admitted to this hospital on 09/15/2013 by pulmonary critical care after he presented to the ER with altered mental status at that time thought to be secondary to right lower extremity cellulitis and sepsis. He was also found to be in acute renal failure with a creatinine of 10, hyperkalemia and hypotension along with pancytopenia and thrombocytopenia. He was started on vancomycin and Zosyn for suspected cellulitis, while in ICU there was suspicion of seizures and was seen by neurology, EEG and MRI were nonacute. He also underwent LP by pulmonary critical care which was not consistent with bacterial meningitis, ID is following the patient in controlling antibiotics. Also in ICU he had left lower extremity DVT, due to thrombocytopenia he has been started on Angiomax infusion.  He required intubation for one day between 9/8 and 09/20/2013, he continued to have fevers, he was placed on cooling blanket, had Foley catheter and a rectal tube and was transferred to the floor on 09/23/2048 under my service on day 8 of his hospital stay.    Subjective:   William Stark today has, No headache, No chest pain, No abdominal pain - No Nausea, No new weakness tingling or numbness, No Cough - SOB.    Assessment & Plan     1. Encephalopathy. Questionable seizure activity while in ICU. Secondary to  infection of unclear source. Bacterial meningitis ruled out, seen by neurology and ID, CT scan, MRI brain and EEG nonacute. LP showed mild increase in lymphocytes however not very impressive. CSF Gram stain negative, HSV PCR is negative, RPR negative, ANA and ANCA negative, hepatitis and [facility] spotted fever serology negative, HIV non reactive, B12 stable, ammonia stable was slightly high on admission,  encephalopathy seems to be improving. He is awake and alert at this time. Acyclovir stopped by ID on 09/23/2013. He is currently being monitored off all antibiotics and acyclovir. Continues to have nonspecific but improving leukocytosis and afebrile.  Ehrlichia  AB pending.    2. Chronic anxiety. Continue Xanax when necessary.    3. Right lower extremity DVT. Was on on Angiomax infusion due to severe thrombocytopenia. Now platelet count better have placed on xaralto. HIT panel -ve.  Lab Results  Component Value Date   PLT 203 09/26/2013     4. Pancytopenia. Question due to infection and sepsis unclear source, monitor with supportive care. Stable An. Panel & B12, requested pathology to review smear. HIT panel negative. Platelet count has recovered.    5. Questionable seizure in ICU. Seen by neurology. EEG-CT/MRI nonacute. None procedure medications per neurology. Seizure free now.    6.DM2 - currently on Lantus along with sliding scale , will drop Lantus dose.. Noted A1c.  Lab Results  Component Value Date   HGBA1C 6.4* 09/23/2013    CBG (last 3)   Recent  Labs  09/25/13 1757 09/25/13 2117 09/26/13 1209  GLUCAP 55* 143* 128*     7.Smoker - counseled.    8.AKI on CKD 3 - had creatinine of of 10 upon admission, was thought to be secondary to dehydration and sepsis. Renal function much improved and likely close to baseline with hydration. Nephrology has signed off.    9. Diarrhea. C. difficile negative. Since he had mild leukocytosis again repeat C. difficile in,  clinically unlikely as diarrhea has resolved.      10. Persistently elevated LFTs. Acute hepatitis panel negative, ammonia level was slightly high upon admission , stable right upper quadrant ultrasound, status post cholecystectomy.    11. Leukocytosis worsening on 09/25/2013 with low-grade fevers again. Suspect it was due to 10 day old right IJ central line which was taken out on 09/26/2013, has a PICC line in place instead. Repeat chest x-ray, UA and blood culture stable thus far. Leukocytosis improving. Please monitor final blood culture results.    12. Mild hyponatremia with tachycardia and low blood pressures. Getting dehydrated again, we'll give him normal saline bolus followed by D5W for 24 hours. Monitor blood pressure heart rate along with BMP in the morning.      Code Status: Full  Family Communication: Wife over the phone in detail  Disposition Plan: SNF   Procedures    9/04 Renal US: no hydronephrosis  9/04 Renal Consult: AKI due to hypovolemic shock, worsened on an ACE-I and NSAIDS . Metabolic acidosis with elevated lactate. Sodium bicarbonate initiated  9/05 - 9/08 Renal function improving. Cr  9/06 Low grade fever  9/07 - 9/08 increasing fever and confusion  9/08 Facial twitching per RN. Concern for seizure  9/08 EEG: This EEG could be consistent with a moderate nonspecific generalized cerebral dysfunction with superimposed medication effect in the form of beta activity secondary previously administered Ativan. There is a possibility that the recurrent bursts of activity could be epileptiform in nature, but this is not definite by this study. Repeat study with Ativan administration may be helpful to differentiate  9/08 HIV negative  9/08 PM: Worsening AMS/agitation - improved with dexmedetomidine. Intubated for "respiratory distress".  9/08 CT head: NAD  9/09 early AM: Neurology consult - recommended repeat EEG with lorazepam administration and MRI brain  9/09  Passed SBT. Extubated. Encephalopathic. Asterixis noted. ? Delusional thinking. PRN Haldol ordered  9/10 Tolerating extubation. Cooperative. Oriented but personality is slightly bizarre (unclear if baseline). Occasional facial twitching. No convulsive episodes. Fevers persist. ID consult requested for fever of unclear etiology.  9/10 LE venous duplex: consistent with acute deep vein thrombosis involving the left posterial tibial vein  9/10 MRI brain: NAD  9/10 LP (Feinstein): 20 WBC, 96% lymphs, protein 95, glu 65, GS negative  9/11 Transfer to med-surg ordered. TRH to assume care as of AM 9/12 and PCCM to sign off  R IJ CVL 9/04 >> (left in due to poor peripheral targets) removed 09/25/2013 ETT 9/08 >> 9/09 Right upper quadrant / 09/23/2013 Right arm PICC line placed 9/14 Peripheral smear ordered follow results    Consults ID, Neuro, Renal, PCCM   Medications  Scheduled Meds: . antiseptic oral rinse  7 mL Mouth Rinse BID  . carvedilol  3.125 mg Oral BID WC  . Influenza vac split quadrivalent PF  0.5 mL Intramuscular Tomorrow-1000  . insulin aspart  0-15 Units Subcutaneous TID WC  . insulin aspart  0-5 Units Subcutaneous QHS  . insulin glargine  8 Units Subcutaneous QHS  .  rivaroxaban  15 mg Oral BID WC   Followed by  . [START ON 10/16/2013] rivaroxaban  20 mg Oral Q supper   Continuous Infusions: . dextrose 110 mL/hr at 09/26/13 0826   PRN Meds:.ALPRAZolam, fentaNYL, sodium chloride  DVT Prophylaxis    Xaralto  Lab Results  Component Value Date   PLT 203 09/26/2013    Antibiotics     Anti-infectives   Start     Dose/Rate Route Frequency Ordered Stop   09/21/13 2100  acyclovir (ZOVIRAX) 775 mg in dextrose 5 % 150 mL IVPB  Status:  Discontinued     10 mg/kg  77.6 kg (Ideal) 165.5 mL/hr over 60 Minutes Intravenous Every 12 hours 09/21/13 2011 09/23/13 1609   09/19/13 2200  vancomycin (VANCOCIN) IVPB 750 mg/150 ml premix  Status:  Discontinued     750 mg 150 mL/hr over  60 Minutes Intravenous Every 12 hours 09/19/13 1043 09/19/13 2202   09/19/13 1000  vancomycin (VANCOCIN) IVPB 1000 mg/200 mL premix  Status:  Discontinued     1,000 mg 200 mL/hr over 60 Minutes Intravenous Every 24 hours 09/18/13 1216 09/19/13 1043   09/17/13 1100  piperacillin-tazobactam (ZOSYN) IVPB 3.375 g  Status:  Discontinued     3.375 g 12.5 mL/hr over 240 Minutes Intravenous 3 times per day 09/17/13 0922 09/20/13 1217   09/17/13 1000  vancomycin (VANCOCIN) IVPB 750 mg/150 ml premix  Status:  Discontinued     750 mg 150 mL/hr over 60 Minutes Intravenous Every 24 hours 09/17/13 0922 09/18/13 1216   09/15/13 1700  piperacillin-tazobactam (ZOSYN) IVPB 2.25 g  Status:  Discontinued     2.25 g 100 mL/hr over 30 Minutes Intravenous Every 6 hours 09/15/13 1354 09/17/13 0922   09/15/13 1000  vancomycin (VANCOCIN) 1,500 mg in sodium chloride 0.9 % 500 mL IVPB     1,500 mg 250 mL/hr over 120 Minutes Intravenous  Once 09/15/13 0959 09/15/13 1245   09/15/13 1000  piperacillin-tazobactam (ZOSYN) IVPB 3.375 g     3.375 g 100 mL/hr over 30 Minutes Intravenous  Once 09/15/13 0959 09/15/13 1057          Objective:   Filed Vitals:   09/25/13 0958 09/25/13 2120 09/26/13 0551 09/26/13 0931  BP: 116/65 126/69 123/74 104/61  Pulse: 112 106 118 129  Temp: 100.3 F (37.9 C) 99.8 F (37.7 C) 98.9 F (37.2 C) 100.3 F (37.9 C)  TempSrc: Oral Oral Oral Oral  Resp: 16 18 18 18   Height:      Weight:  89.3 kg (196 lb 13.9 oz)    SpO2: 96% 97% 98% 96%    Wt Readings from Last 3 Encounters:  09/25/13 89.3 kg (196 lb 13.9 oz)  08/15/13 95.255 kg (210 lb)     Intake/Output Summary (Last 24 hours) at 09/26/13 1257 Last data filed at 09/26/13 1038  Gross per 24 hour  Intake      0 ml  Output   1400 ml  Net  -1400 ml     Physical Exam  Awake Alert, Oriented X 2, No new F.N deficits, Normal affect [place].AT,PERRAL Supple Neck,No JVD, No cervical lymphadenopathy appriciated. R IJ C  line Symmetrical Chest wall movement, Good air movement bilaterally, CTAB RRR,No Gallops,Rubs or new Murmurs, No Parasternal Heave +ve B.Sounds, Abd Soft, No tenderness, No organomegaly appriciated, No rebound - guarding or rigidity. No Cyanosis, Clubbing or edema, No new Rash or bruise    Data Review   Micro Results  Results for  KAIL, FRALEY (MRN [id number]) as of 09/23/2013 11:18  Ref. Range 09/19/2013 10:40 09/19/2013 19:43 09/21/2013 15:00 09/21/2013 16:41  RPR Latest Range: NON REAC   NON REAC NON REAC   HSV 1 DNA Latest Range: Not Detected     Not Detected  HSV 2 DNA Latest Range: Not Detected     Not Detected  Specimen source hsv No range found    CSF  Hep A IgM Latest Range: NON REACTIVE  NON REACTIVE     Hepatitis B Surface Ag Latest Range: NEGATIVE  NEGATIVE     Hep B C IgM Latest Range: NON REACTIVE  NON REACTIVE     HCV Ab Latest Range: NEGATIVE  NEGATIVE     RMSF IgG No range found   0.17   RMSF IgM Latest Range: 0.00-0.89 IV   0.09   HIV 1 RNA Quant Latest Range: <20 copies/mL   <20   HIV1 RNA Quant, Log Latest Range: <1.30 log 10   <1.30   HIV Latest Range: NONREACTIVE  NONREACTIVE        Recent Results (from the past 240 hour(s))  CLOSTRIDIUM DIFFICILE BY PCR     Status: None   Collection Time    09/19/13  8:27 AM      Result Value Ref Range Status   C difficile by pcr NEGATIVE  NEGATIVE Final  STOOL CULTURE     Status: None   Collection Time    09/20/13  5:04 PM      Result Value Ref Range Status   Specimen Description STOOL   Final   Special Requests NONE   Final   Culture     Final   Value: NO SALMONELLA, SHIGELLA, CAMPYLOBACTER, YERSINIA, OR E.COLI 0157:H7 ISOLATED     Note: REDUCED NORMAL FLORA PRESENT     Performed at [facility]   Report Status 09/24/2013 FINAL   Final  CULTURE, EXPECTORATED SPUTUM-ASSESSMENT     Status: None   Collection Time    09/21/13 11:41 AM      Result Value Ref Range Status   Specimen Description SPUTUM   Final    Special Requests NONE   Final   Sputum evaluation     Final   Value: THIS SPECIMEN IS ACCEPTABLE. RESPIRATORY CULTURE REPORT TO FOLLOW.   Report Status 09/21/2013 FINAL   Final  CULTURE, RESPIRATORY (NON-EXPECTORATED)     Status: None   Collection Time    09/21/13 11:41 AM      Result Value Ref Range Status   Specimen Description SPUTUM   Final   Special Requests NONE   Final   Gram Stain     Final   Value: RARE WBC PRESENT,BOTH PMN AND MONONUCLEAR     RARE SQUAMOUS EPITHELIAL CELLS PRESENT     NO ORGANISMS SEEN     Performed at [facility]   Culture     Final   Value: FEW CANDIDA ALBICANS     Performed at [facility]   Report Status 09/23/2013 FINAL   Final  CULTURE, BLOOD (ROUTINE X 2)     Status: None   Collection Time    09/21/13  3:54 PM      Result Value Ref Range Status   Specimen Description BLOOD RIGHT ARM   Final   Special Requests BOTTLES DRAWN AEROBIC AND ANAEROBIC 10CC   Final   Culture  Setup Time     Final   Value: 09/21/2013 19:21  Performed at [facility]     Final   Value:        BLOOD CULTURE RECEIVED NO GROWTH TO DATE CULTURE WILL BE HELD FOR 5 DAYS BEFORE ISSUING A FINAL NEGATIVE REPORT     Performed at [facility]   Report Status PENDING   Incomplete  CULTURE, BLOOD (ROUTINE X 2)     Status: None   Collection Time    09/21/13  4:05 PM      Result Value Ref Range Status   Specimen Description BLOOD RIGHT HAND   Final   Special Requests BOTTLES DRAWN AEROBIC ONLY 8CC   Final   Culture  Setup Time     Final   Value: 09/21/2013 19:18     Performed at [facility]   Culture     Final   Value:        BLOOD CULTURE RECEIVED NO GROWTH TO DATE CULTURE WILL BE HELD FOR 5 DAYS BEFORE ISSUING A FINAL NEGATIVE REPORT     Performed at [facility]   Report Status PENDING   Incomplete  CSF CULTURE     Status: None   Collection Time    09/21/13  4:41 PM      Result Value Ref Range Status    Specimen Description CSF   Final   Special Requests 1.3ML CSF FLUID NO 2   Final   Gram Stain     Final   Value: WBC PRESENT, PREDOMINANTLY MONONUCLEAR     NO ORGANISMS SEEN     CYTOSPIN Performed at [facility]     Performed at [facility]   Culture     Final   Value: NO GROWTH 3 DAYS     Performed at [facility]   Report Status 09/24/2013 FINAL   Final  GRAM STAIN     Status: None   Collection Time    09/21/13  4:41 PM      Result Value Ref Range Status   Specimen Description CSF   Final   Special Requests 1.3ML CSF FLUID   Final   Gram Stain     Final   Value: WBC PRESENT, PREDOMINANTLY MONONUCLEAR     NO ORGANISMS SEEN     CYTOSPIN SLIDE   Report Status 09/21/2013 FINAL   Final  AFB CULTURE WITH SMEAR     Status: None   Collection Time    09/21/13  4:43 PM      Result Value Ref Range Status   Specimen Description CSF   Final   Special Requests 1.3ML CSF FLUID   Final   Acid Fast Smear     Final   Value: NO ACID FAST BACILLI SEEN     Performed at [facility]   Culture     Final   Value: CULTURE WILL BE EXAMINED FOR 6 WEEKS BEFORE ISSUING A FINAL REPORT     Performed at [facility]   Report Status PENDING   Incomplete  URINE CULTURE     Status: None   Collection Time    09/25/13 11:39 AM      Result Value Ref Range Status   Specimen Description URINE, CLEAN CATCH   Final   Special Requests NONE   Final   Culture  Setup Time     Final   Value: 09/25/2013 15:01     Performed at [place]  PENDING   Incomplete   Culture     Final   Value: Culture reincubated for better growth     Performed at [facility]   Report Status PENDING   Incomplete  CULTURE, BLOOD (ROUTINE X 2)     Status: None   Collection Time    09/25/13  1:50 PM      Result Value Ref Range Status   Specimen Description BLOOD LEFT HAND   Final   Special Requests BOTTLES DRAWN AEROBIC AND ANAEROBIC 10CC   Final   Culture   Setup Time     Final   Value: 09/25/2013 17:11     Performed at [facility]   Culture     Final   Value:        BLOOD CULTURE RECEIVED NO GROWTH TO DATE CULTURE WILL BE HELD FOR 5 DAYS BEFORE ISSUING A FINAL NEGATIVE REPORT     Performed at [facility]   Report Status PENDING   Incomplete  CULTURE, BLOOD (ROUTINE X 2)     Status: None   Collection Time    09/25/13  1:55 PM      Result Value Ref Range Status   Specimen Description BLOOD LEFT ARM   Final   Special Requests     Final   Value: BOTTLES DRAWN AEROBIC AND ANAEROBIC 10CC BLUE, 5CC RED   Culture  Setup Time     Final   Value: 09/25/2013 17:11     Performed at [facility]   Culture     Final   Value:        BLOOD CULTURE RECEIVED NO GROWTH TO DATE CULTURE WILL BE HELD FOR 5 DAYS BEFORE ISSUING A FINAL NEGATIVE REPORT     Performed at [facility]   Report Status PENDING   Incomplete    Radiology Reports Dg Chest 2 View  09/25/2013   CLINICAL DATA:  Cough, weakness  EXAM: CHEST - 2 VIEW  COMPARISON:  09/22/2013  FINDINGS: New right PICC line tip SVC RA junction. Ventricular atrial shunt tubing noted as before. Normal heart size and vascularity. Stable ectatic tortuous aorta. Lungs remain clear. Improving lung volumes and resolving atelectasis. No effusion or pneumothorax. Trachea midline. Prior cholecystectomy noted.  IMPRESSION: Interval right PICC line insertion, tip SVC RA junction.  Improving lung volumes with resolving atelectasis.  No acute airspace process or edema.   Electronically Signed   By: Daryll Brod M.D.   On: 09/25/2013 13:16   US Abdomen Limited Ruq  09/24/2013   CLINICAL DATA:  Elevated liver function tests.  EXAM: US ABDOMEN LIMITED - RIGHT UPPER QUADRANT  COMPARISON:  No priors.  FINDINGS: Gallbladder:  Status post cholecystectomy.  Common bile duct:  Diameter: 5 mm in the porta hepatis.  Liver:  No focal lesion identified. Within normal limits in parenchymal echogenicity.   Other Findings:  Small right pleural effusion.  IMPRESSION: 1. No acute findings in the abdomen. 2. Small right pleural effusion. 3. Status post cholecystectomy.   Electronically Signed   By: Vinnie Langton M.D.   On: 09/24/2013 14:47    CBC  Recent Labs Lab 09/22/13 0430 09/23/13 0422 09/24/13 0400 09/25/13 0519 09/26/13 0506  WBC 18.8* 16.8* 19.5* 26.2* 19.6*  HGB 9.7* 9.3* 9.0* 8.9* 8.8*  HCT 29.2* 27.4* 26.3* 26.4* 25.6*  PLT 84* 70* 67* 118* 203  MCV 90.1 87.0 87.4 88.0 88.0  MCH 29.9 29.5 29.9 29.7 30.2  MCHC 33.2  33.9 34.2 33.7 34.4  RDW 15.2 15.0 14.9 14.9 14.9    Chemistries   Recent Labs Lab 09/19/13 2300 09/20/13 0500 09/21/13 0420 09/22/13 0430 09/23/13 0422 09/25/13 0519 09/26/13 0506  NA 150* 153* 150* 142 145 147 149*  K 3.4* 3.6* 3.5* 3.9 4.2 3.9 3.9  CL 113* 116* 115* 110 113* 115* 118*  CO2 24 24 25 23 23 22 21   GLUCOSE 93 125* 166* 228* 148* 152* 72  BUN 30* 32* 33* 29* 30* 21 21  CREATININE 1.83* 1.79* 1.99* 2.01* 2.22* 1.85* 2.02*  CALCIUM 6.9* 6.8* 6.6* 6.5* 6.8* 7.0* 7.3*  AST 136* 111* 84* 93*  --   --   --   ALT 258* 240* 183* 138*  --   --   --   ALKPHOS 459* 448* 450* 343*  --   --   --   BILITOT 1.6* 1.3* 1.0 0.8  --   --   --    ------------------------------------------------------------------------------------------------------------------ estimated creatinine clearance is 38.4 ml/min (by C-G formula based on Cr of 2.02). ------------------------------------------------------------------------------------------------------------------ No results found for this basename: HGBA1C,  in the last 72 hours ------------------------------------------------------------------------------------------------------------------ No results found for this basename: CHOL, HDL, LDLCALC, TRIG, CHOLHDL, LDLDIRECT,  in the last 72 hours ------------------------------------------------------------------------------------------------------------------ No  results found for this basename: TSH, T4TOTAL, FREET3, T3FREE, THYROIDAB,  in the last 72 hours ------------------------------------------------------------------------------------------------------------------ No results found for this basename: VITAMINB12, FOLATE, FERRITIN, TIBC, IRON, RETICCTPCT,  in the last 72 hours  Coagulation profile  Recent Labs Lab 09/19/13 1539 09/19/13 2300  INR 1.39 1.39    No results found for this basename: DDIMER,  in the last 72 hours  Cardiac Enzymes  Recent Labs Lab 09/19/13 2300  TROPONINI <0.30   ------------------------------------------------------------------------------------------------------------------ No components found with this basename: POCBNP,      Time Spent in minutes   45   Lala Lund K M.D on 09/26/2013 at 12:57 PM  Between 7am to 7pm - Pager - [phone]  After 7pm go to [email or web address] - password [email or web address]  And look for the night coverage person covering for me after hours  [facility] Office  [phone]   **Disclaimer: This note may have been dictated with voice recognition software. Similar sounding words can inadvertently be transcribed and this note may contain transcription errors which may not have been corrected upon publication of note.**

## 2013-09-26 NOTE — Progress Notes (Signed)
09/26/2013 1:28 PM  Unable to send stool sample for cdiff at this time as it is mixed with quite a fair amount of urine.   Princella Pellegrini

## 2013-09-27 LAB — GLUCOSE, CAPILLARY
GLUCOSE-CAPILLARY: 139 mg/dL — AB (ref 70–99)
GLUCOSE-CAPILLARY: 203 mg/dL — AB (ref 70–99)
GLUCOSE-CAPILLARY: 91 mg/dL (ref 70–99)
Glucose-Capillary: 74 mg/dL (ref 70–99)
Glucose-Capillary: 70 mg/dL (ref 70–99)
Glucose-Capillary: 113 mg/dL — ABNORMAL HIGH (ref 70–99)

## 2013-09-27 LAB — CBC
HCT: 23.9 % — ABNORMAL LOW (ref 39.0–52.0)
Hemoglobin: 8 g/dL — ABNORMAL LOW (ref 13.0–17.0)
MCH: 29.1 pg (ref 26.0–34.0)
MCHC: 33.5 g/dL (ref 30.0–36.0)
MCV: 86.9 fL (ref 78.0–100.0)
Platelets: 319 10*3/uL (ref 150–400)
RBC: 2.75 MIL/uL — AB (ref 4.22–5.81)
RDW: 15.2 % (ref 11.5–15.5)
WBC: 9.6 10*3/uL (ref 4.0–10.5)

## 2013-09-27 LAB — CULTURE, BLOOD (ROUTINE X 2)
CULTURE: NO GROWTH
Culture: NO GROWTH

## 2013-09-27 LAB — BASIC METABOLIC PANEL
ANION GAP: 9 (ref 5–15)
BUN: 24 mg/dL — AB (ref 6–23)
CO2: 21 meq/L (ref 19–32)
CREATININE: 1.92 mg/dL — AB (ref 0.50–1.35)
Calcium: 7 mg/dL — ABNORMAL LOW (ref 8.4–10.5)
Chloride: 111 mEq/L (ref 96–112)
GFR calc non Af Amer: 34 mL/min — ABNORMAL LOW (ref >90)
GFR, EST AFRICAN AMERICAN: 40 mL/min — AB (ref >90)
Glucose, Bld: 99 mg/dL (ref 70–99)
POTASSIUM: 3.7 meq/L (ref 3.7–5.3)
Sodium: 141 mEq/L (ref 137–147)

## 2013-09-27 LAB — APTT: APTT: 60 s — AB (ref 24–37)

## 2013-09-27 MED ORDER — SODIUM CHLORIDE 0.9 % IV BOLUS (SEPSIS)
500.0000 mL | Freq: Once | INTRAVENOUS | Status: AC
  Administered 2013-09-27: 500 mL via INTRAVENOUS

## 2013-09-27 NOTE — Progress Notes (Signed)
Chart reviewed.                                                                                                                                                 William Stark, is a 68 y.o. male, DOB - Jun 22, 1945, UXL:[id number]  Admit date - 09/15/2013   Admitting Physician Brand Males, MD  Outpatient Primary MD for the patient is [facility], MD  LOS - 12   Chief Complaint  Patient presents with  . Hypotension        Brief summary. 69 year old African American male with history of anxiety, type 2 diabetes mellitus and hypertension who was admitted to this hospital on 09/15/2013 by pulmonary critical care after he presented to the ER with altered mental status at that time thought to be secondary to right lower extremity cellulitis and sepsis. He was also found to be in acute renal failure with a creatinine of 10, hyperkalemia and hypotension along with pancytopenia and thrombocytopenia. He was started on vancomycin and Zosyn for suspected cellulitis, while in ICU there was suspicion of seizures and was seen by neurology, EEG and MRI were nonacute. He also underwent LP by pulmonary critical care which was not consistent with bacterial meningitis, ID is following the patient in controlling antibiotics. Also in ICU he had left lower extremity DVT, due to thrombocytopenia he has been started on Angiomax infusion.  He required intubation for one day between 9/8 and 09/20/2013, he continued to have fevers, he was placed on cooling blanket, had Foley catheter and a rectal tube and was transferred to the floor on 09/23/2048 under my service on day 8 of his hospital stay.    Subjective:   [name] weak.no other complaints. Ok with going to SNF  Assessment & Plan     1. Encephalopathy. Questionable seizure activity while in ICU. Secondary to infection of unclear source. Bacterial meningitis ruled out, seen by neurology and ID, CT scan, MRI brain and EEG nonacute. LP showed mild  increase in lymphocytes however not very impressive. CSF Gram stain negative, HSV PCR is negative, RPR negative, ANA and ANCA negative, hepatitis and [facility] spotted fever serology negative, HIV non reactive, B12 stable, ammonia stable was slightly high on admission,  encephalopathy seems to be improving. He is awake and alert at this time. Acyclovir stopped by ID on 09/23/2013. He is currently being monitored off all antibiotics and acyclovir.   Ehrlichia  AB pending.  2. Chronic anxiety. Continue Xanax when necessary.  3. Right lower extremity DVT. Was on on Angiomax infusion due to severe thrombocytopenia. Now platelet count better have placed on xaralto. HIT panel -ve.  Lab Results  Component Value Date   PLT 319 09/27/2013   4. Pancytopenia. WBC and platelet count now normal.  5. Questionable seizure in ICU. Seen by neurology. EEG-CT/MRI nonacute. None procedure medications per neurology.  Seizure free now.  6.DM2 - currently on Lantus along with sliding scale , will drop Lantus dose.. Noted A1c.  Lab Results  Component Value Date   HGBA1C 6.4* 09/23/2013    CBG (last 3)   Recent Labs  09/26/13 2126 09/27/13 0750 09/27/13 1157  GLUCAP 103* 113* 139*     7.Smoker - counseled.  8.AKI on CKD 3 - had creatinine of of 10 upon admission, was thought to be secondary to dehydration and sepsis. Renal function much improved and likely close to baseline with hydration. Nephrology has signed off.  9. No further diarrhea. D/c c diff and enteric precautions.   10. Persistently elevated LFTs. Acute hepatitis panel negative, ammonia level was slightly high upon admission , stable right upper quadrant ultrasound, status post cholecystectomy.  11. Leukocytosis worsening on 09/25/2013 with low-grade fevers again. Suspect it was due to 10 day old right IJ central line which was taken out on 09/26/2013, has a PICC line in place instead. Repeat chest x-ray, UA and blood culture stable  thus far. Leukocytosis improving resolved. Workup negative. Low grade fevers could be related to DVT  12. Mild hyponatremia with tachycardia and low blood pressures. Blood pressure stable, euvolemic.   Code Status: Full  Family Communication: Wife   Disposition Plan: SNF 1-2 days if stable   Procedures    9/04 Renal US: no hydronephrosis  9/04 Renal Consult: AKI due to hypovolemic shock, worsened on an ACE-I and NSAIDS . Metabolic acidosis with elevated lactate. Sodium bicarbonate initiated  9/05 - 9/08 Renal function improving. Cr  9/06 Low grade fever  9/07 - 9/08 increasing fever and confusion  9/08 Facial twitching per RN. Concern for seizure  9/08 EEG: This EEG could be consistent with a moderate nonspecific generalized cerebral dysfunction with superimposed medication effect in the form of beta activity secondary previously administered Ativan. There is a possibility that the recurrent bursts of activity could be epileptiform in nature, but this is not definite by this study. Repeat study with Ativan administration may be helpful to differentiate  9/08 HIV negative  9/08 PM: Worsening AMS/agitation - improved with dexmedetomidine. Intubated for "respiratory distress".  9/08 CT head: NAD  9/09 early AM: Neurology consult - recommended repeat EEG with lorazepam administration and MRI brain  9/09 Passed SBT. Extubated. Encephalopathic. Asterixis noted. ? Delusional thinking. PRN Haldol ordered  9/10 Tolerating extubation. Cooperative. Oriented but personality is slightly bizarre (unclear if baseline). Occasional facial twitching. No convulsive episodes. Fevers persist. ID consult requested for fever of unclear etiology.  9/10 LE venous duplex: consistent with acute deep vein thrombosis involving the left posterial tibial vein  9/10 MRI brain: NAD  9/10 LP (Feinstein): 20 WBC, 96% lymphs, protein 95, glu 65, GS negative  9/11 Transfer to med-surg ordered. TRH to assume care as of AM  9/12 and PCCM to sign off  R IJ CVL 9/04 >> (left in due to poor peripheral targets) removed 09/25/2013 ETT 9/08 >> 9/09 Right upper quadrant / 09/23/2013 Right arm PICC line placed 9/14 Peripheral smear ordered follow results   Consults ID, Neuro, Renal, PCCM  Medications  Scheduled Meds: . antiseptic oral rinse  7 mL Mouth Rinse BID  . carvedilol  3.125 mg Oral BID WC  . Influenza vac split quadrivalent PF  0.5 mL Intramuscular Tomorrow-1000  . insulin aspart  0-15 Units Subcutaneous TID WC  . insulin aspart  0-5 Units Subcutaneous QHS  . insulin glargine  8 Units Subcutaneous QHS  . rivaroxaban  15 mg Oral BID WC   Followed by  . [START ON 10/16/2013] rivaroxaban  20 mg Oral Q supper   Continuous Infusions:   PRN Meds:.ALPRAZolam, fentaNYL, sodium chloride  DVT Prophylaxis    Xaralto  Lab Results  Component Value Date   PLT 319 09/27/2013    Antibiotics     Anti-infectives   Start     Dose/Rate Route Frequency Ordered Stop   09/21/13 2100  acyclovir (ZOVIRAX) 775 mg in dextrose 5 % 150 mL IVPB  Status:  Discontinued     10 mg/kg  77.6 kg (Ideal) 165.5 mL/hr over 60 Minutes Intravenous Every 12 hours 09/21/13 2011 09/23/13 1609   09/19/13 2200  vancomycin (VANCOCIN) IVPB 750 mg/150 ml premix  Status:  Discontinued     750 mg 150 mL/hr over 60 Minutes Intravenous Every 12 hours 09/19/13 1043 09/19/13 2202   09/19/13 1000  vancomycin (VANCOCIN) IVPB 1000 mg/200 mL premix  Status:  Discontinued     1,000 mg 200 mL/hr over 60 Minutes Intravenous Every 24 hours 09/18/13 1216 09/19/13 1043   09/17/13 1100  piperacillin-tazobactam (ZOSYN) IVPB 3.375 g  Status:  Discontinued     3.375 g 12.5 mL/hr over 240 Minutes Intravenous 3 times per day 09/17/13 0922 09/20/13 1217   09/17/13 1000  vancomycin (VANCOCIN) IVPB 750 mg/150 ml premix  Status:  Discontinued     750 mg 150 mL/hr over 60 Minutes Intravenous Every 24 hours 09/17/13 0922 09/18/13 1216   09/15/13 1700   piperacillin-tazobactam (ZOSYN) IVPB 2.25 g  Status:  Discontinued     2.25 g 100 mL/hr over 30 Minutes Intravenous Every 6 hours 09/15/13 1354 09/17/13 0922   09/15/13 1000  vancomycin (VANCOCIN) 1,500 mg in sodium chloride 0.9 % 500 mL IVPB     1,500 mg 250 mL/hr over 120 Minutes Intravenous  Once 09/15/13 0959 09/15/13 1245   09/15/13 1000  piperacillin-tazobactam (ZOSYN) IVPB 3.375 g     3.375 g 100 mL/hr over 30 Minutes Intravenous  Once 09/15/13 0959 09/15/13 1057          Objective:   Filed Vitals:   09/26/13 0931 09/26/13 2127 09/27/13 0633 09/27/13 0920  BP: 104/61 91/53 114/63 123/73  Pulse: 129 98 102 103  Temp: 100.3 F (37.9 C) 98.2 F (36.8 C) 98.3 F (36.8 C) 100 F (37.8 C)  TempSrc: Oral Oral Oral Oral  Resp: 18 16 17 18   Height:  6' (1.829 m)    Weight:  86.4 kg (190 lb 7.6 oz)    SpO2: 96% 100% 95% 97%    Wt Readings from Last 3 Encounters:  09/26/13 86.4 kg (190 lb 7.6 oz)  08/15/13 95.255 kg (210 lb)     Intake/Output Summary (Last 24 hours) at 09/27/13 1237 Last data filed at 09/27/13 0900  Gross per 24 hour  Intake 1662.33 ml  Output    200 ml  Net 1462.33 ml     Physical Exam  Awake Alert, Oriented appropriate [place].AT,PERRAL Supple Neck,No JVD, No cervical lymphadenopathy appriciated.  Symmetrical Chest wall movement, Good air movement bilaterally, CTAB RRR,No Gallops,Rubs or new Murmurs, No Parasternal Heave +ve B.Sounds, Abd Soft, No tenderness, No organomegaly appriciated, No rebound - guarding or rigidity. No Cyanosis, Clubbing or edema, No new Rash or bruise   Data Review   Micro Results  Results for William, Stark (MRN [id number]) as of 09/23/2013 11:18  Ref. Range 09/19/2013 10:40 09/19/2013 19:43 09/21/2013 15:00 09/21/2013 16:41  RPR Latest Range:  NON REAC   NON REAC NON REAC   HSV 1 DNA Latest Range: Not Detected     Not Detected  HSV 2 DNA Latest Range: Not Detected     Not Detected  Specimen source hsv No range found     CSF  Hep A IgM Latest Range: NON REACTIVE  NON REACTIVE     Hepatitis B Surface Ag Latest Range: NEGATIVE  NEGATIVE     Hep B C IgM Latest Range: NON REACTIVE  NON REACTIVE     HCV Ab Latest Range: NEGATIVE  NEGATIVE     RMSF IgG No range found   0.17   RMSF IgM Latest Range: 0.00-0.89 IV   0.09   HIV 1 RNA Quant Latest Range: <20 copies/mL   <20   HIV1 RNA Quant, Log Latest Range: <1.30 log 10   <1.30   HIV Latest Range: NONREACTIVE  NONREACTIVE        Recent Results (from the past 240 hour(s))  CLOSTRIDIUM DIFFICILE BY PCR     Status: None   Collection Time    09/19/13  8:27 AM      Result Value Ref Range Status   C difficile by pcr NEGATIVE  NEGATIVE Final  STOOL CULTURE     Status: None   Collection Time    09/20/13  5:04 PM      Result Value Ref Range Status   Specimen Description STOOL   Final   Special Requests NONE   Final   Culture     Final   Value: NO SALMONELLA, SHIGELLA, CAMPYLOBACTER, YERSINIA, OR E.COLI 0157:H7 ISOLATED     Note: REDUCED NORMAL FLORA PRESENT     Performed at [facility]   Report Status 09/24/2013 FINAL   Final  CULTURE, EXPECTORATED SPUTUM-ASSESSMENT     Status: None   Collection Time    09/21/13 11:41 AM      Result Value Ref Range Status   Specimen Description SPUTUM   Final   Special Requests NONE   Final   Sputum evaluation     Final   Value: THIS SPECIMEN IS ACCEPTABLE. RESPIRATORY CULTURE REPORT TO FOLLOW.   Report Status 09/21/2013 FINAL   Final  CULTURE, RESPIRATORY (NON-EXPECTORATED)     Status: None   Collection Time    09/21/13 11:41 AM      Result Value Ref Range Status   Specimen Description SPUTUM   Final   Special Requests NONE   Final   Gram Stain     Final   Value: RARE WBC PRESENT,BOTH PMN AND MONONUCLEAR     RARE SQUAMOUS EPITHELIAL CELLS PRESENT     NO ORGANISMS SEEN     Performed at [facility]   Culture     Final   Value: FEW CANDIDA ALBICANS     Performed at [facility]   Report  Status 09/23/2013 FINAL   Final  CULTURE, BLOOD (ROUTINE X 2)     Status: None   Collection Time    09/21/13  3:54 PM      Result Value Ref Range Status   Specimen Description BLOOD RIGHT ARM   Final   Special Requests BOTTLES DRAWN AEROBIC AND ANAEROBIC 10CC   Final   Culture  Setup Time     Final   Value: 09/21/2013 19:21     Performed at [facility]   Culture     Final   Value: NO GROWTH 5 DAYS  Performed at [facility]   Report Status 09/27/2013 FINAL   Final  CULTURE, BLOOD (ROUTINE X 2)     Status: None   Collection Time    09/21/13  4:05 PM      Result Value Ref Range Status   Specimen Description BLOOD RIGHT HAND   Final   Special Requests BOTTLES DRAWN AEROBIC ONLY 8CC   Final   Culture  Setup Time     Final   Value: 09/21/2013 19:18     Performed at [facility]   Culture     Final   Value: NO GROWTH 5 DAYS     Performed at [facility]   Report Status 09/27/2013 FINAL   Final  CSF CULTURE     Status: None   Collection Time    09/21/13  4:41 PM      Result Value Ref Range Status   Specimen Description CSF   Final   Special Requests 1.3ML CSF FLUID NO 2   Final   Gram Stain     Final   Value: WBC PRESENT, PREDOMINANTLY MONONUCLEAR     NO ORGANISMS SEEN     CYTOSPIN Performed at [facility]     Performed at [facility]   Culture     Final   Value: NO GROWTH 3 DAYS     Performed at [facility]   Report Status 09/24/2013 FINAL   Final  GRAM STAIN     Status: None   Collection Time    09/21/13  4:41 PM      Result Value Ref Range Status   Specimen Description CSF   Final   Special Requests 1.3ML CSF FLUID   Final   Gram Stain     Final   Value: WBC PRESENT, PREDOMINANTLY MONONUCLEAR     NO ORGANISMS SEEN     CYTOSPIN SLIDE   Report Status 09/21/2013 FINAL   Final  AFB CULTURE WITH SMEAR     Status: None   Collection Time    09/21/13  4:43 PM      Result Value Ref Range Status   Specimen  Description CSF   Final   Special Requests 1.3ML CSF FLUID   Final   Acid Fast Smear     Final   Value: NO ACID FAST BACILLI SEEN     Performed at [facility]   Culture     Final   Value: CULTURE WILL BE EXAMINED FOR 6 WEEKS BEFORE ISSUING A FINAL REPORT     Performed at [facility]   Report Status PENDING   Incomplete  URINE CULTURE     Status: None   Collection Time    09/25/13 11:39 AM      Result Value Ref Range Status   Specimen Description URINE, CLEAN CATCH   Final   Special Requests NONE   Final   Culture  Setup Time     Final   Value: 09/25/2013 15:01     Performed at [place] PENDING   Incomplete   Culture     Final   Value: Culture reincubated for better growth     Performed at [facility]   Report Status PENDING   Incomplete  CULTURE, BLOOD (ROUTINE X 2)     Status: None   Collection Time    09/25/13  1:50 PM      Result Value Ref  Range Status   Specimen Description BLOOD LEFT HAND   Final   Special Requests BOTTLES DRAWN AEROBIC AND ANAEROBIC 10CC   Final   Culture  Setup Time     Final   Value: 09/25/2013 17:11     Performed at [facility]   Culture     Final   Value:        BLOOD CULTURE RECEIVED NO GROWTH TO DATE CULTURE WILL BE HELD FOR 5 DAYS BEFORE ISSUING A FINAL NEGATIVE REPORT     Performed at [facility]   Report Status PENDING   Incomplete  CULTURE, BLOOD (ROUTINE X 2)     Status: None   Collection Time    09/25/13  1:55 PM      Result Value Ref Range Status   Specimen Description BLOOD LEFT ARM   Final   Special Requests     Final   Value: BOTTLES DRAWN AEROBIC AND ANAEROBIC 10CC BLUE, 5CC RED   Culture  Setup Time     Final   Value: 09/25/2013 17:11     Performed at [facility]   Culture     Final   Value:        BLOOD CULTURE RECEIVED NO GROWTH TO DATE CULTURE WILL BE HELD FOR 5 DAYS BEFORE ISSUING A FINAL NEGATIVE REPORT     Performed at [facility]    Report Status PENDING   Incomplete    Radiology Reports Dg Chest 2 View  09/25/2013   CLINICAL DATA:  Cough, weakness  EXAM: CHEST - 2 VIEW  COMPARISON:  09/22/2013  FINDINGS: New right PICC line tip SVC RA junction. Ventricular atrial shunt tubing noted as before. Normal heart size and vascularity. Stable ectatic tortuous aorta. Lungs remain clear. Improving lung volumes and resolving atelectasis. No effusion or pneumothorax. Trachea midline. Prior cholecystectomy noted.  IMPRESSION: Interval right PICC line insertion, tip SVC RA junction.  Improving lung volumes with resolving atelectasis.  No acute airspace process or edema.   Electronically Signed   By: Daryll Brod M.D.   On: 09/25/2013 13:16    CBC  Recent Labs Lab 09/23/13 0422 09/24/13 0400 09/25/13 0519 09/26/13 0506 09/27/13 0530  WBC 16.8* 19.5* 26.2* 19.6* 9.6  HGB 9.3* 9.0* 8.9* 8.8* 8.0*  HCT 27.4* 26.3* 26.4* 25.6* 23.9*  PLT 70* 67* 118* 203 319  MCV 87.0 87.4 88.0 88.0 86.9  MCH 29.5 29.9 29.7 30.2 29.1  MCHC 33.9 34.2 33.7 34.4 33.5  RDW 15.0 14.9 14.9 14.9 15.2    Chemistries   Recent Labs Lab 09/21/13 0420 09/22/13 0430 09/23/13 0422 09/25/13 0519 09/26/13 0506 09/27/13 0530  NA 150* 142 145 147 149* 141  K 3.5* 3.9 4.2 3.9 3.9 3.7  CL 115* 110 113* 115* 118* 111  CO2 25 23 23 22 21 21   GLUCOSE 166* 228* 148* 152* 72 99  BUN 33* 29* 30* 21 21 24*  CREATININE 1.99* 2.01* 2.22* 1.85* 2.02* 1.92*  CALCIUM 6.6* 6.5* 6.8* 7.0* 7.3* 7.0*  AST 84* 93*  --   --   --   --   ALT 183* 138*  --   --   --   --   ALKPHOS 450* 343*  --   --   --   --   BILITOT 1.0 0.8  --   --   --   --    ------------------------------------------------------------------------------------------------------------------ estimated creatinine clearance is 40.4 ml/min (by C-G formula based on  Cr of 1.92). ------------------------------------------------------------------------------------------------------------------ No results  found for this basename: HGBA1C,  in the last 72 hours ------------------------------------------------------------------------------------------------------------------ No results found for this basename: CHOL, HDL, LDLCALC, TRIG, CHOLHDL, LDLDIRECT,  in the last 72 hours ------------------------------------------------------------------------------------------------------------------ No results found for this basename: TSH, T4TOTAL, FREET3, T3FREE, THYROIDAB,  in the last 72 hours ------------------------------------------------------------------------------------------------------------------ No results found for this basename: VITAMINB12, FOLATE, FERRITIN, TIBC, IRON, RETICCTPCT,  in the last 72 hours  Coagulation profile No results found for this basename: INR, PROTIME,  in the last 168 hours  No results found for this basename: DDIMER,  in the last 72 hours  Cardiac Enzymes No results found for this basename: CK, CKMB, TROPONINI, MYOGLOBIN,  in the last 168 hours ------------------------------------------------------------------------------------------------------------------ No components found with this basename: POCBNP,      Time Spent in minutes   35   Alexcia Schools L M.D on 09/27/2013 at 12:37 PM [facility] [phone]

## 2013-09-27 NOTE — Progress Notes (Signed)
Physical Therapy Treatment Patient Details Name: William Stark MRN: [id number] DOB: 10-10-1945 Today's Date: 09/27/2013    History of Present Illness Patient is a 68 y/o male admitted with AMS, hypotension and renal failure. CHest XRAY- Bibasilar atelectasis. Pt with long complicated hospital course - found to be in acute renal failure with a creatinine of 10, hyperkalemic and with hypotension along with pancytopenia and thrombocytopenia. He was started on vancomycin and Zosyn for suspected cellulitis, while in ICU there was suspicion of seizures and pt was seen by neurology, EEG and MRI were nonacute. He also underwent LP by pulmonary critical care which was not consistent with bacterial meningitis. Found to have DVT (+) LLE. Required intubation for one day between 9/8 and 09/20/2013    PT Comments    Noting slow, but present, progress with mobility and activity tolerance; HR 111-107 at end of session; Continue to recommend SNF for post-acute therapy needs.   Follow Up Recommendations  SNF;Supervision/Assistance - 24 hour     Equipment Recommendations  Rolling walker with 5" wheels    Recommendations for Other Services       Precautions / Restrictions Precautions Precautions: Fall    Mobility  Bed Mobility Overal bed mobility: Needs Assistance Bed Mobility: Sit to Supine;Supine to Sit     Supine to sit: Min assist     General bed mobility comments: Cues for technique and to initiate  Transfers Overall transfer level: Needs assistance Equipment used: 1 person hand held assist   Sit to Stand: Mod assist         General transfer comment: Minguard assist, to steady RW; cues for safety and hand placement but pt tends to pull up on RW despite cues  Ambulation/Gait Ambulation/Gait assistance: Min assist Ambulation Distance (Feet): 12 Feet (aroung bed) Assistive device: Rolling walker (2 wheeled) Gait Pattern/deviations: Step-through pattern     General Gait  Details: Cues and min assist for RW proximity; Pt describes feeling fatigued, so limited amb today   Stairs            Wheelchair Mobility    Modified Rankin (Stroke Patients Only)       Balance             Standing balance-Leahy Scale: Poor                      Cognition Arousal/Alertness: Awake/alert Behavior During Therapy: WFL for tasks assessed/performed Overall Cognitive Status: No family/caregiver present to determine baseline cognitive functioning                      Exercises      General Comments        Pertinent Vitals/Pain      Home Living                      Prior Function            PT Goals (current goals can now be found in the care plan section) Acute Rehab PT Goals Patient Stated Goal: did not state, but agreeable to getting up PT Goal Formulation: With patient Time For Goal Achievement: 10/07/13 Potential to Achieve Goals: Good Progress towards PT goals: Progressing toward goals    Frequency  Min 3X/week    PT Plan Current plan remains appropriate    Co-evaluation             End of Session Equipment Utilized During Treatment: Gait belt  Activity Tolerance: Patient limited by fatigue Patient left: in bed;with call bell/phone within reach (in R semi-sidelying)     Time: 0141-0301 PT Time Calculation (min): 28 min  Charges:  $Gait Training: 8-22 mins $Therapeutic Activity: 8-22 mins                    G Codes:      Quin Hoop 09/27/2013, 4:18 PM  Roney Marion, [place] Pager [phone] Office [phone]

## 2013-09-27 NOTE — Clinical Social Work Note (Addendum)
CSW received consult for short-term rehab at a skilled facility.CSW talked with patient and he is in agreement (full assessment to follow).  Havier Deeb Givens, MSW, LCSW [phone]

## 2013-09-28 LAB — GLUCOSE, CAPILLARY
Glucose-Capillary: 60 mg/dL — ABNORMAL LOW (ref 70–99)
Glucose-Capillary: 102 mg/dL — ABNORMAL HIGH (ref 70–99)
Glucose-Capillary: 129 mg/dL — ABNORMAL HIGH (ref 70–99)
Glucose-Capillary: 93 mg/dL (ref 70–99)

## 2013-09-28 LAB — COMPREHENSIVE METABOLIC PANEL
ALBUMIN: 1.5 g/dL — AB (ref 3.5–5.2)
ALK PHOS: 236 U/L — AB (ref 39–117)
ALT: 156 U/L — ABNORMAL HIGH (ref 0–53)
ANION GAP: 11 (ref 5–15)
AST: 143 U/L — ABNORMAL HIGH (ref 0–37)
BUN: 24 mg/dL — AB (ref 6–23)
CALCIUM: 7 mg/dL — AB (ref 8.4–10.5)
CO2: 19 mEq/L (ref 19–32)
Chloride: 114 mEq/L — ABNORMAL HIGH (ref 96–112)
Creatinine, Ser: 1.93 mg/dL — ABNORMAL HIGH (ref 0.50–1.35)
GFR calc non Af Amer: 34 mL/min — ABNORMAL LOW (ref >90)
GFR, EST AFRICAN AMERICAN: 39 mL/min — AB (ref >90)
Glucose, Bld: 62 mg/dL — ABNORMAL LOW (ref 70–99)
Potassium: 3.7 mEq/L (ref 3.7–5.3)
Sodium: 144 mEq/L (ref 137–147)
TOTAL PROTEIN: 5 g/dL — AB (ref 6.0–8.3)
Total Bilirubin: 0.9 mg/dL (ref 0.3–1.2)

## 2013-09-28 LAB — CBC
HCT: 23.9 % — ABNORMAL LOW (ref 39.0–52.0)
HEMOGLOBIN: 8 g/dL — AB (ref 13.0–17.0)
MCH: 28.9 pg (ref 26.0–34.0)
MCHC: 33.5 g/dL (ref 30.0–36.0)
MCV: 86.3 fL (ref 78.0–100.0)
Platelets: 443 10*3/uL — ABNORMAL HIGH (ref 150–400)
RBC: 2.77 MIL/uL — ABNORMAL LOW (ref 4.22–5.81)
RDW: 15.3 % (ref 11.5–15.5)
WBC: 9.6 10*3/uL (ref 4.0–10.5)

## 2013-09-28 LAB — EHRLICHIA ANTIBODY PANEL
E chaffeensis (HGE) Ab, IgG: <1:64 {titer}
E chaffeensis (HGE) Ab, IgM: <1:20 {titer}

## 2013-09-28 LAB — APTT: aPTT: 61 seconds — ABNORMAL HIGH (ref 24–37)

## 2013-09-28 MED ORDER — RIVAROXABAN 15 MG PO TABS: 15.0000 mg | ORAL_TABLET | Freq: Two times a day (BID) | ORAL | Status: DC

## 2013-09-28 MED ORDER — ALPRAZOLAM 0.5 MG PO TABS: 0.5000 mg | ORAL_TABLET | Freq: Three times a day (TID) | ORAL | Status: DC | PRN

## 2013-09-28 MED ORDER — INSULIN ASPART 100 UNIT/ML ~~LOC~~ SOLN
0.0000 [IU] | Freq: Three times a day (TID) | SUBCUTANEOUS | Status: DC
Start: 2013-09-28 — End: 2013-10-04

## 2013-09-28 MED ORDER — RIVAROXABAN 20 MG PO TABS: 20.0000 mg | ORAL_TABLET | Freq: Every day | ORAL | Status: DC

## 2013-09-28 NOTE — Discharge Summary (Signed)
Physician Discharge Summary  ORLONDO HOLYCROSS UXL:[id number] DOB: 1945/03/21 DOA: 09/15/2013  PCP: Cathlean Cower, MD  Admit date: 09/15/2013 Discharge date: 09/28/2013  Time spent: greater than 30 minutes  Recommendations for Outpatient Follow-up:  1. To SNF  Discharge Diagnoses:  Principal Problem:   Severe sepsis Active Problems:   Hypovolemic shock   Diabetes type 2 with renal manifestation   Acute renal injury   Hyperkalemia   Elevated liver enzymes   Increased anion gap metabolic acidosis   Hypoglycemia   Cellulitis of right foot   Acute renal failure   Acute encephalopathy   Intermittent fever likely from DVT   DVT of lower extremity (deep venous thrombosis)   Thrombocytopenia deconditioning  Discharge Condition: stable  Filed Weights   09/25/13 2120 09/26/13 2127 09/27/13 2141  Weight: 89.3 kg (196 lb 13.9 oz) 86.4 kg (190 lb 7.6 oz) 85.7 kg (188 lb 15 oz)    History of present illness/Hospital Course:  68 year old African American male with history of anxiety, type 2 diabetes mellitus and hypertension who was admitted to this hospital on 09/15/2013 by pulmonary critical care after he presented to the ER with altered mental status at that time thought to be secondary to right lower extremity cellulitis and sepsis. He was also found to be in acute renal failure with a creatinine of 10, hyperkalemia and hypotension along with pancytopenia and thrombocytopenia. He was started on vancomycin and Zosyn for suspected cellulitis, while in ICU there was suspicion of seizures and was seen by neurology, EEG and MRI were nonacute. He also underwent LP by pulmonary critical care which was not consistent with bacterial meningitis, ID consulted. Also in ICU he had left lower extremity DVT, due to thrombocytopenia he has been started on Angiomax infusion, transitioned to xarelto on 09/25/13 He required intubation for one day between 9/8 and 09/20/2013, he continued to have fevers, he was  placed on cooling blanket, had Foley catheter and a rectal tube and was transferred to the floor on 09/23/2013 to hospitalists day 8 of his hospital stay.   1. Toxic metabolic Encephalopathy. Questionable seizure activity while in ICU. Bacterial meningitis ruled out, seen by neurology and ID, CT scan, MRI brain and EEG nonacute. LP showed mild increase in lymphocytes however not very impressive. CSF Gram stain negative, HSV PCR is negative, RPR negative, ANA and ANCA negative, hepatitis and [facility] spotted fever serology negative, HIV non reactive, B12 stable, ammonia stable was slightly high on admission, encephalopathy seems to be improving. He is awake and alert and appropriate. Acyclovir stopped by ID on 09/23/2013. He is currently being monitored off all antibiotics and acyclovir.  Ehrlichia AB pending.   2. Chronic anxiety. Continue Xanax when necessary.   3. Right lower extremity DVT. Was on on Angiomax infusion due to severe thrombocytopenia. Now platelet count better have placed on xaralto. HIT panel -ve.   4. Pancytopenia due to infection. WBC and platelet count normalized. hgb low but stable  5. Questionable seizure in ICU. Seen by neurology. EEG-CT/MRI nonacute. None procedure medications per neurology. Seizure free now.   6.DM2 - currently on Lantus along with sliding scale , will drop Lantus dose.. Noted A1c.  Lab Results   Component  Value  Date    HGBA1C  6.4*  09/23/2013    CBG (last 3)   Recent Labs   09/26/13 2126  09/27/13 0750  09/27/13 1157   GLUCAP  103*  113*  139*    7.Smoker - counseled.  8.AKI on CKD 3 - had creatinine of of 10 upon admission, was thought to be secondary to dehydration and sepsis. Renal function much improved and likely close to baseline with hydration. Nephrology has signed off.   9. No further diarrhea. c diff negative  10.  elevated LFTs likely from shock liver. Acute hepatitis panel negative, ammonia level was slightly high upon  admission , stable right upper quadrant ultrasound, status post cholecystectomy.   11. Has had intermittent low grade fevers, but WBC normal, blood and urine cultures, CXR unremarkable. Likely from DVT.  Deconditioning: going to SNF  Code Status: Full   Procedures  Intubation, mechanical ventilation 9/10 LP Titus Mould): 20 WBC, 96% lymphs, protein 95, glu 65, GS negative  R IJ CVL 9/04  Right arm PICC line placed 9/14, removed  Consults ID, Neuro, Renal, PCCM      Discharge Exam: Filed Vitals:   09/28/13 1305  BP: 106/65  Pulse: 103  Temp:   Resp:     General: alert, oriented. Appropriate, weak Cardiovascular: RRR  Respiratory: CTA Abd: S, NT, ND Ext no CCE  Discharge Instructions   Diet Carb Modified    Complete by:  As directed      Increase activity slowly    Complete by:  As directed           Current Discharge Medication List    START taking these medications   Details  insulin aspart (NOVOLOG) 100 UNIT/ML injection Inject 0-15 Units into the skin 3 (three) times daily with meals. Qty: 10 mL, Refills: 11    !! Rivaroxaban (XARELTO) 15 MG TABS tablet Take 1 tablet (15 mg total) by mouth 2 (two) times daily with a meal. Through 10/5, then 20 mg once daily Qty: 30 tablet    !! rivaroxaban (XARELTO) 20 MG TABS tablet Take 1 tablet (20 mg total) by mouth daily with supper. STARTING 10/17/13 Qty: 30 tablet     !! - Potential duplicate medications found. Please discuss with provider.    CONTINUE these medications which have CHANGED   Details  ALPRAZolam (XANAX) 0.5 MG tablet Take 1 tablet (0.5 mg total) by mouth 3 (three) times daily as needed for anxiety. Qty: 20 tablet, Refills: 0      STOP taking these medications     glipiZIDE (GLUCOTROL XL) 5 MG 24 hr tablet      hydrochlorothiazide (HYDRODIURIL) 25 MG tablet      ibuprofen (ADVIL,MOTRIN) 200 MG tablet      lisinopril (PRINIVIL,ZESTRIL) 10 MG tablet      metFORMIN (GLUCOPHAGE) 1000 MG tablet       cefUROXime (CEFTIN) 500 MG tablet        No Known Allergies    The results of significant diagnostics from this hospitalization (including imaging, microbiology, ancillary and laboratory) are listed below for reference.    Significant Diagnostic Studies: Dg Chest 1 View  09/15/2013   CLINICAL DATA:  Hypotension  EXAM: CHEST - 1 VIEW  COMPARISON:  PA and lateral chest of August 03, 2005  FINDINGS: The lungs are mildly hypoinflated. There is no focal infiltrate. The heart and pulmonary vascularity are normal. The mediastinum is normal in width. There is mild stable tortuosity of the descending thoracic aorta. There is no pleural effusion. The bony thorax is unremarkable.  IMPRESSION: There is no acute cardiopulmonary abnormality.   Electronically Signed   By: David  Martinique   On: 09/15/2013 08:09   Dg Chest 2 View  09/25/2013  CLINICAL DATA:  Cough, weakness  EXAM: CHEST - 2 VIEW  COMPARISON:  09/22/2013  FINDINGS: New right PICC line tip SVC RA junction. Ventricular atrial shunt tubing noted as before. Normal heart size and vascularity. Stable ectatic tortuous aorta. Lungs remain clear. Improving lung volumes and resolving atelectasis. No effusion or pneumothorax. Trachea midline. Prior cholecystectomy noted.  IMPRESSION: Interval right PICC line insertion, tip SVC RA junction.  Improving lung volumes with resolving atelectasis.  No acute airspace process or edema.   Electronically Signed   By: Daryll Brod M.D.   On: 09/25/2013 13:16   Ct Head Wo Contrast  09/19/2013   CLINICAL DATA:  Fever.  Seizure-like activity .  EXAM: CT HEAD WITHOUT CONTRAST  TECHNIQUE: Contiguous axial images were obtained from the base of the skull through the vertex without intravenous contrast.  COMPARISON:  None.  FINDINGS: Motion artifact. No intra-axial or extra-axial pathologic fold blood collection. No mass lesion. No hydrocephalus. No hemorrhage. Orbits are unremarkable. Mucosal thickening noted in frontal and  ethmoid sinuses consistent with mild sinusitis. No significant fluid collections in the mastoids. No acute bony abnormality.  IMPRESSION: 1. No acute intracranial abnormality. 2. Mild mucosal thickening noted in the frontal and ethmoidal sinuses consistent with mild sinusitis. Sinusitis may be chronic.   Electronically Signed   By: Marcello Moores  Register   On: 09/19/2013 18:29   Mr Brain W Wo Contrast  09/21/2013   CLINICAL DATA:  Fever with leukocytosis, encephalopathy, and possible seizure. Evaluate for encephalitis.  EXAM: MRI HEAD WITHOUT AND WITH CONTRAST  TECHNIQUE: Multiplanar, multiecho pulse sequences of the brain and surrounding structures were obtained without and with intravenous contrast.  CONTRAST:  83mL MULTIHANCE GADOBENATE DIMEGLUMINE 529 MG/ML IV SOLN  COMPARISON:  Head CT 09/19/2013  FINDINGS: Dedicated thin section imaging through the temporal lobes demonstrates normal volume and signal of the hippocampi. There is no evidence of heterotopia.  There is no evidence of acute infarct, intracranial hemorrhage, mass, midline shift, or extra-axial fluid collection. Minimal periventricular white matter T2 hyperintensity is nonspecific and less than is often seen in patients of this age. There is mild generalized cerebral atrophy. No abnormal enhancement is identified, although postcontrast images are moderately degraded by motion.  Orbits are unremarkable. There is moderate bilateral ethmoid air cell mucosal thickening, with mild mucosal thickening in the remainder of the paranasal sinuses. Small bilateral mastoid effusions are present. Major intracranial vascular flow voids are preserved.  IMPRESSION: 1. No acute intracranial abnormality identified. 2. Mild cerebral atrophy. 3. Moderate paranasal sinus inflammatory mucosal disease and small bilateral mastoid effusions.   Electronically Signed   By: Logan Bores   On: 09/21/2013 20:58   US Renal  09/15/2013   CLINICAL DATA:  Acute renal failure  EXAM:  RENAL/URINARY TRACT ULTRASOUND COMPLETE  COMPARISON:  CT 06/12/2005  FINDINGS: Right Kidney:  Length: 12.8 cm. Echogenicity within normal limits. No mass or hydronephrosis visualized.  Left Kidney:  Length: 12.1 cm. Normal renal cortical thickness and echogenicity. No hydronephrosis. There is a 2.9 cm cyst within the inferior pole of the left kidney and a 2.6 cm cyst within the interpolar region left kidney.  Bladder:  Decompressed with Foley catheter  IMPRESSION: No hydronephrosis.   Electronically Signed   By: Lovey Newcomer M.D.   On: 09/15/2013 12:42   Dg Chest Port 1 View  09/22/2013   CLINICAL DATA:  Respiratory failure.  EXAM: PORTABLE CHEST - 1 VIEW  COMPARISON:  09/20/2013.  FINDINGS: Interim extubation. Right IJ  line stable position. Mediastinum and hilar structures are stable. Bibasilar atelectasis. No pleural effusion pneumothorax. Heart size stable. No acute osseous abnormality. Mediastinum and hilar structures are normal.  IMPRESSION: 1. Interim extubation.  Right IJ line in stable position. 2. Bibasilar atelectasis.   Electronically Signed   By: Marcello Moores  Register   On: 09/22/2013 07:17   Dg Chest Port 1 View  09/20/2013   CLINICAL DATA:  Central line placement.  EXAM: PORTABLE CHEST - 1 VIEW  COMPARISON:  09/20/2013  FINDINGS: Endotracheal tube tip measures 3 cm above the carinal. Interval placement of a right central venous catheter. Tip localizes over the cavoatrial junction. No pneumothorax. Shallow inspiration. Atelectasis in the left lung base. Normal heart size and pulmonary vascularity.  IMPRESSION: Appliances appear in satisfactory position. No pneumothorax. Shallow inspiration with atelectasis in the left lung base.   Electronically Signed   By: Lucienne Capers M.D.   On: 09/20/2013 03:15   Dg Chest Port 1 View  09/20/2013   CLINICAL DATA:  Endotracheal tube placement. Attempted central line placement on left side.  EXAM: PORTABLE CHEST - 1 VIEW  COMPARISON:  09/15/2013  FINDINGS:  Endotracheal tube placed with tip measuring 3 cm above the carina. Shallow inspiration. Heart size and pulmonary vascularity are normal. Linear atelectasis in the lung bases. No pneumothorax. No blunting of costophrenic angles.  IMPRESSION: Appliances appear in satisfactory location. No pneumothorax. Shallow inspiration with atelectasis in the lung bases.   Electronically Signed   By: Lucienne Capers M.D.   On: 09/20/2013 02:46   Dg Chest Port 1 View  09/15/2013   CLINICAL DATA:  Central line insertion.  EXAM: PORTABLE CHEST - 1 VIEW  COMPARISON:  09/16/2003  FINDINGS: Interval placement of right central venous catheter with tip over the mid SVC region. No pneumothorax. Shallow inspiration. Hazy opacity in the left lung base suggesting infiltration or atelectasis. Right lung is grossly clear. Normal heart size and pulmonary vascularity.  IMPRESSION: Right central venous catheter tip over the mid SVC region. No pneumothorax. Hazy infiltration or atelectasis in the left lung base.   Electronically Signed   By: Lucienne Capers M.D.   On: 09/15/2013 21:53   US Abdomen Limited Ruq  09/24/2013   CLINICAL DATA:  Elevated liver function tests.  EXAM: US ABDOMEN LIMITED - RIGHT UPPER QUADRANT  COMPARISON:  No priors.  FINDINGS: Gallbladder:  Status post cholecystectomy.  Common bile duct:  Diameter: 5 mm in the porta hepatis.  Liver:  No focal lesion identified. Within normal limits in parenchymal echogenicity.  Other Findings:  Small right pleural effusion.  IMPRESSION: 1. No acute findings in the abdomen. 2. Small right pleural effusion. 3. Status post cholecystectomy.   Electronically Signed   By: Vinnie Langton M.D.   On: 09/24/2013 14:47   9/08 EEG: This EEG could be consistent with a moderate nonspecific generalized cerebral dysfunction with superimposed medication effect in the form of beta activity secondary previously administered Ativan. There is a possibility that the recurrent bursts of activity could  be epileptiform in nature, but this is not definite by this study. Repeat study with Ativan administration may be helpful to differentiate   9/10 LE venous duplex: consistent with acute deep vein thrombosis involving the left posterial tibial vein   Microbiology: Recent Results (from the past 240 hour(s))  CLOSTRIDIUM DIFFICILE BY PCR     Status: None   Collection Time    09/19/13  8:27 AM      Result Value  Ref Range Status   C difficile by pcr NEGATIVE  NEGATIVE Final  STOOL CULTURE     Status: None   Collection Time    09/20/13  5:04 PM      Result Value Ref Range Status   Specimen Description STOOL   Final   Special Requests NONE   Final   Culture     Final   Value: NO SALMONELLA, SHIGELLA, CAMPYLOBACTER, YERSINIA, OR E.COLI 0157:H7 ISOLATED     Note: REDUCED NORMAL FLORA PRESENT     Performed at [facility]   Report Status 09/24/2013 FINAL   Final  CULTURE, EXPECTORATED SPUTUM-ASSESSMENT     Status: None   Collection Time    09/21/13 11:41 AM      Result Value Ref Range Status   Specimen Description SPUTUM   Final   Special Requests NONE   Final   Sputum evaluation     Final   Value: THIS SPECIMEN IS ACCEPTABLE. RESPIRATORY CULTURE REPORT TO FOLLOW.   Report Status 09/21/2013 FINAL   Final  CULTURE, RESPIRATORY (NON-EXPECTORATED)     Status: None   Collection Time    09/21/13 11:41 AM      Result Value Ref Range Status   Specimen Description SPUTUM   Final   Special Requests NONE   Final   Gram Stain     Final   Value: RARE WBC PRESENT,BOTH PMN AND MONONUCLEAR     RARE SQUAMOUS EPITHELIAL CELLS PRESENT     NO ORGANISMS SEEN     Performed at [facility]   Culture     Final   Value: FEW CANDIDA ALBICANS     Performed at [facility]   Report Status 09/23/2013 FINAL   Final  CULTURE, BLOOD (ROUTINE X 2)     Status: None   Collection Time    09/21/13  3:54 PM      Result Value Ref Range Status   Specimen Description BLOOD RIGHT ARM   Final    Special Requests BOTTLES DRAWN AEROBIC AND ANAEROBIC 10CC   Final   Culture  Setup Time     Final   Value: 09/21/2013 19:21     Performed at [facility]   Culture     Final   Value: NO GROWTH 5 DAYS     Performed at [facility]   Report Status 09/27/2013 FINAL   Final  CULTURE, BLOOD (ROUTINE X 2)     Status: None   Collection Time    09/21/13  4:05 PM      Result Value Ref Range Status   Specimen Description BLOOD RIGHT HAND   Final   Special Requests BOTTLES DRAWN AEROBIC ONLY 8CC   Final   Culture  Setup Time     Final   Value: 09/21/2013 19:18     Performed at [facility]   Culture     Final   Value: NO GROWTH 5 DAYS     Performed at [facility]   Report Status 09/27/2013 FINAL   Final  CSF CULTURE     Status: None   Collection Time    09/21/13  4:41 PM      Result Value Ref Range Status   Specimen Description CSF   Final   Special Requests 1.3ML CSF FLUID NO 2   Final   Gram Stain     Final   Value: WBC PRESENT, PREDOMINANTLY MONONUCLEAR  NO ORGANISMS SEEN     CYTOSPIN Performed at [facility]     Performed at [facility]   Culture     Final   Value: NO GROWTH 3 DAYS     Performed at [facility]   Report Status 09/24/2013 FINAL   Final  GRAM STAIN     Status: None   Collection Time    09/21/13  4:41 PM      Result Value Ref Range Status   Specimen Description CSF   Final   Special Requests 1.3ML CSF FLUID   Final   Gram Stain     Final   Value: WBC PRESENT, PREDOMINANTLY MONONUCLEAR     NO ORGANISMS SEEN     CYTOSPIN SLIDE   Report Status 09/21/2013 FINAL   Final  AFB CULTURE WITH SMEAR     Status: None   Collection Time    09/21/13  4:43 PM      Result Value Ref Range Status   Specimen Description CSF   Final   Special Requests 1.3ML CSF FLUID   Final   Acid Fast Smear     Final   Value: NO ACID FAST BACILLI SEEN     Performed at [facility]   Culture     Final   Value: CULTURE  WILL BE EXAMINED FOR 6 WEEKS BEFORE ISSUING A FINAL REPORT     Performed at [facility]   Report Status PENDING   Incomplete  URINE CULTURE     Status: None   Collection Time    09/25/13 11:39 AM      Result Value Ref Range Status   Specimen Description URINE, CLEAN CATCH   Final   Special Requests NONE   Final   Culture  Setup Time     Final   Value: 09/25/2013 15:01     Performed at [place]     Final   Value: 35,000 COLONIES/ML     Performed at [facility]   Culture     Final   Value: STAPHYLOCOCCUS SPECIES (COAGULASE NEGATIVE)     Note: RIFAMPIN AND GENTAMICIN SHOULD NOT BE USED AS SINGLE DRUGS FOR TREATMENT OF STAPH INFECTIONS.     Performed at [facility]   Report Status PENDING   Incomplete  CULTURE, BLOOD (ROUTINE X 2)     Status: None   Collection Time    09/25/13  1:50 PM      Result Value Ref Range Status   Specimen Description BLOOD LEFT HAND   Final   Special Requests BOTTLES DRAWN AEROBIC AND ANAEROBIC 10CC   Final   Culture  Setup Time     Final   Value: 09/25/2013 17:11     Performed at [facility]   Culture     Final   Value:        BLOOD CULTURE RECEIVED NO GROWTH TO DATE CULTURE WILL BE HELD FOR 5 DAYS BEFORE ISSUING A FINAL NEGATIVE REPORT     Performed at [facility]   Report Status PENDING   Incomplete  CULTURE, BLOOD (ROUTINE X 2)     Status: None   Collection Time    09/25/13  1:55 PM      Result Value Ref Range Status   Specimen Description BLOOD LEFT ARM   Final   Special Requests     Final   Value: BOTTLES DRAWN AEROBIC  AND ANAEROBIC 10CC BLUE, 5CC RED   Culture  Setup Time     Final   Value: 09/25/2013 17:11     Performed at [facility]   Culture     Final   Value:        BLOOD CULTURE RECEIVED NO GROWTH TO DATE CULTURE WILL BE HELD FOR 5 DAYS BEFORE ISSUING A FINAL NEGATIVE REPORT     Performed at [facility]   Report Status PENDING   Incomplete      Labs: Basic Metabolic Panel:  Recent Labs Lab 09/23/13 0422 09/25/13 0519 09/26/13 0506 09/27/13 0530 09/28/13 0530  NA 145 147 149* 141 144  K 4.2 3.9 3.9 3.7 3.7  CL 113* 115* 118* 111 114*  CO2 23 22 21 21 19   GLUCOSE 148* 152* 72 99 62*  BUN 30* 21 21 24* 24*  CREATININE 2.22* 1.85* 2.02* 1.92* 1.93*  CALCIUM 6.8* 7.0* 7.3* 7.0* 7.0*   Liver Function Tests:  Recent Labs Lab 09/22/13 0430 09/28/13 0530  AST 93* 143*  ALT 138* 156*  ALKPHOS 343* 236*  BILITOT 0.8 0.9  PROT 3.5* 5.0*  ALBUMIN 1.7* 1.5*   No results found for this basename: LIPASE, AMYLASE,  in the last 168 hours No results found for this basename: AMMONIA,  in the last 168 hours CBC:  Recent Labs Lab 09/24/13 0400 09/25/13 0519 09/26/13 0506 09/27/13 0530 09/28/13 0530  WBC 19.5* 26.2* 19.6* 9.6 9.6  HGB 9.0* 8.9* 8.8* 8.0* 8.0*  HCT 26.3* 26.4* 25.6* 23.9* 23.9*  MCV 87.4 88.0 88.0 86.9 86.3  PLT 67* 118* 203 319 443*   Cardiac Enzymes: No results found for this basename: CKTOTAL, CKMB, CKMBINDEX, TROPONINI,  in the last 168 hours BNP: BNP (last 3 results) No results found for this basename: PROBNP,  in the last 8760 hours CBG:  Recent Labs Lab 09/27/13 1725 09/27/13 2138 09/28/13 0725 09/28/13 0754 09/28/13 1143  GLUCAP 203* 91 60* 102* 129*       Signed:  [place] L  [facility] 09/28/2013, 2:16 PM

## 2013-09-28 NOTE — Progress Notes (Signed)
ANTICOAGULATION CONSULT NOTE - Follow Up Consult  Pharmacy Consult for Xarelto Indication: RLE DVT  No Known Allergies  Patient Measurements: Height: 6' (182.9 cm) Weight: 188 lb 15 oz (85.7 kg) IBW/kg (Calculated) : 77.6  Vital Signs: Temp: 100.5 F (38.1 C) (09/17 0958) Temp src: Oral (09/17 0958) BP: 107/89 mmHg (09/17 0958) Pulse Rate: 108 (09/17 0958)  Labs:  Recent Labs  09/26/13 0506 09/27/13 0530 09/28/13 0530  HGB 8.8* 8.0* 8.0*  HCT 25.6* 23.9* 23.9*  PLT 203 319 443*  APTT 47* 60* 61*  CREATININE 2.02* 1.92* 1.93*    Estimated Creatinine Clearance: 40.2 ml/min (by C-G formula based on Cr of 1.93).   Assessment: 16 YOM who continues on Xarelto for a new RLE DVT this admission found via dopplers on 9/10. Renal function has remained stable - SCr 1.93, CrCl~40 ml/min. Hgb/Hct low but stable, platelets rising. The patient was educated on Xarelto this admission. Dose remains appropriate at this time.   Goal of Therapy:  Appropriate anticoagulation   Plan:  1. Continue Xarelto 15 mg bid with meals thru 10/15/2013 2. Start Xarelto 20 mg once daily with supper on 10/16/2013 3. Will continue to monitor renal function for dose appropriateness, and CBC to monitor for bleeding  Alycia Rossetti, PharmD, BCPS Clinical Pharmacist Pager: [phone] 09/28/2013 12:53 PM

## 2013-09-28 NOTE — Progress Notes (Signed)
The ambulance came to pick up patient, patient was stable and voiced no pain.Report was given to the nurse at [facility] and patient's wife was called when ambulance came to pick patient up

## 2013-09-28 NOTE — Clinical Social Work Psychosocial (Signed)
Clinical Social Work Department BRIEF PSYCHOSOCIAL ASSESSMENT 09/28/2013  Patient:  William Stark, William Stark     Account Number:  [id number]     Admit date:  09/15/2013  Clinical Social Worker:  Frederico Hamman  Date/Time:  09/28/2013 05:11 AM  Referred by:  Physician  Date Referred:  09/24/2013 Referred for  SNF Placement   Other Referral:   Interview type:  Patient Other interview type:    PSYCHOSOCIAL DATA Living Status:  WIFE Admitted from facility:   Level of care:   Primary support name:  Unice Bailey Primary support relationship to patient:  SPOUSE Degree of support available:    CURRENT CONCERNS Current Concerns  Post-Acute Placement   Other Concerns:    SOCIAL WORK ASSESSMENT / PLAN On 09/27/13 CSW talked with patient about discharge planning and recommendation of short-term rehab. Patient is in agreement but did not have a facility preference. Patient given a list of skilled facilities in [facility] and facility search process explained.   Assessment/plan status:  No Further Intervention Required Other assessment/ plan:   Information/referral to community resources:   Skilled facility list for [facility]    PATIENT'S/FAMILY'S RESPONSE TO PLAN OF CARE: Patient receptive to talking with CSW on 9/16  about short-term rehab and in agreement with going. Patient informed that CSW will follow-up with him on Thursday regarding facility responses.

## 2013-09-28 NOTE — Clinical Social Work Placement (Addendum)
Clinical Social Work Department CLINICAL SOCIAL WORK PLACEMENT NOTE 09/28/2013  Patient:  William Stark, William Stark  Account Number:  [id number] Admit date:  09/15/2013  Clinical Social Worker:  Makiyla Linch Givens, LCSW  Date/time:  09/28/2013 05:23 AM  Clinical Social Work is seeking post-discharge placement for this patient at the following level of care:   SKILLED NURSING   (*CSW will update this form in Epic as items are completed)   09/28/2013  Patient/family provided with [place] Department of Clinical Social Work's list of facilities offering this level of care within the geographic area requested by the patient (or if unable, by the patient's family).  09/28/2013  Patient/family informed of their freedom to choose among providers that offer the needed level of care, that participate in Medicare, Medicaid or managed care program needed by the patient, have an available bed and are willing to accept the patient.    Patient/family informed of [facility]' ownership interest in [facility], as well as of the fact that they are under no obligation to receive care at this facility.  PASARR submitted to EDS on 09/28/2013 PASARR number received on 09/28/2013  FL2 transmitted to all facilities in geographic area requested by pt/family on  09/27/2013 FL2 transmitted to all facilities within larger geographic area on   Patient informed that his/her managed care company has contracts with or will negotiate with  certain facilities, including the following:     Patient/family informed of bed offers received:  09/28/2013 Patient chooses bed at [facility] Physician recommends and patient chooses bed at    Patient to be transferred to [facility] on  09/28/2013 Patient to be transferred to facility by ambulance Patient and family notified of transfer on 09/28/2013 Name of family member notified:  Wife, Tramar Brueckner by phone.  The following physician request were entered in  Epic:   Additional Comments:

## 2013-09-29 ENCOUNTER — Non-Acute Institutional Stay (SKILLED_NURSING_FACILITY): Payer: Medicare Other | Admitting: Adult Health

## 2013-09-29 ENCOUNTER — Encounter: Payer: Self-pay | Admitting: Adult Health

## 2013-09-29 DIAGNOSIS — F419 Anxiety disorder, unspecified: Secondary | ICD-10-CM | POA: Insufficient documentation

## 2013-09-29 DIAGNOSIS — I82409 Acute embolism and thrombosis of unspecified deep veins of unspecified lower extremity: Secondary | ICD-10-CM

## 2013-09-29 DIAGNOSIS — E119 Type 2 diabetes mellitus without complications: Secondary | ICD-10-CM

## 2013-09-29 DIAGNOSIS — N183 Chronic kidney disease, stage 3 unspecified: Secondary | ICD-10-CM

## 2013-09-29 DIAGNOSIS — R5381 Other malaise: Secondary | ICD-10-CM | POA: Insufficient documentation

## 2013-09-29 DIAGNOSIS — E139 Other specified diabetes mellitus without complications: Secondary | ICD-10-CM

## 2013-09-29 DIAGNOSIS — I82401 Acute embolism and thrombosis of unspecified deep veins of right lower extremity: Secondary | ICD-10-CM

## 2013-09-29 DIAGNOSIS — F411 Generalized anxiety disorder: Secondary | ICD-10-CM

## 2013-09-29 LAB — URINE CULTURE: Colony Count: 35000

## 2013-09-29 NOTE — Progress Notes (Signed)
Patient ID: William Stark, male   DOB: 1945-12-20, 68 y.o.   MRN: [id number]               PROGRESS NOTE  DATE: 09/29/2013  FACILITY: Nursing Home Location: [facility]  LEVEL OF CARE: SNF (31)  Acute Visit  CHIEF COMPLAINT:  Follow-up Hospitalization  HISTORY OF PRESENT ILLNESS: This is a 68 year old male who has been admitted to [facility] on 09/28/13 from [facility] with Severe Sepsis. Patient was recently treated for RLE cellulitis. He has been admitted for a short-term rehabilitation.  REASSESSMENT OF ONGOING PROBLEM(S):  DM:pt's DM remains stable.  Pt denies polyuria, polydipsia, polyphagia, changes in vision or hypoglycemic episodes.  No complications noted from the medication presently being used.   9/15 hemoglobin A1c is: 6.4  DVT: The DVTs remains stable.  Patient denies calf pain, chest pain or shortness of breath.  Patient is currently on anti-coagulation and does not report any complications from that.  CHRONIC KIDNEY DISEASE: The patient's chronic kidney disease remains stable.  Patient denies increasing lower extremity swelling or confusion. 9/15 BUN 24 and creatinine 1.93   PAST MEDICAL HISTORY : Reviewed.  No changes/see problem list  CURRENT MEDICATIONS: Reviewed per MAR/see medication list  REVIEW OF SYSTEMS:  GENERAL: no change in appetite, no fatigue, no weight changes, no fever, chills or weakness RESPIRATORY: no cough, SOB, DOE, wheezing, hemoptysis CARDIAC: no chest pain,  or palpitations, +edema GI: no abdominal pain, diarrhea, constipation, heart burn, nausea or vomiting  PHYSICAL EXAMINATION  GENERAL: no acute distress, normal body habitus EYES: conjunctivae normal, sclerae normal, normal eye lids NECK: supple, trachea midline, no neck masses, no thyroid tenderness, no thyromegaly LYMPHATICS: no LAN in the neck, no supraclavicular LAN RESPIRATORY: breathing is even & unlabored, BS CTAB CARDIAC: RRR, no murmur,no  extra heart sounds, BLE edema 2+ GI: abdomen soft, normal BS, no masses, no tenderness, no hepatomegaly, no splenomegaly EXTREMITIES:  Able to move all 4 extremities PSYCHIATRIC: the patient is alert & oriented to person, affect & behavior appropriate  LABS/RADIOLOGY: Labs reviewed: Basic Metabolic Panel:  Recent Labs  09/15/13 1211 09/15/13 1342  09/26/13 0506 09/27/13 0530 09/28/13 0530  NA 137  --   < > 149* 141 144  K 6.5*  --   < > 3.9 3.7 3.7  CL 109  --   < > 118* 111 114*  CO2 <7*  --   < > 21 21 19   GLUCOSE 157*  --   < > 72 99 62*  BUN 136*  --   < > 21 24* 24*  CREATININE 9.78*  --   < > 2.02* 1.92* 1.93*  CALCIUM 6.2*  --   < > 7.3* 7.0* 7.0*  MG  --  2.5  --   --   --   --   PHOS  --  5.8*  --   --   --   --   < > = values in this interval not displayed. Liver Function Tests:  Recent Labs  09/21/13 0420 09/22/13 0430 09/28/13 0530  AST 84* 93* 143*  ALT 183* 138* 156*  ALKPHOS 450* 343* 236*  BILITOT 1.0 0.8 0.9  PROT 3.9* 3.5* 5.0*  ALBUMIN 1.9* 1.7* 1.5*    Recent Labs  09/19/13 2300  LIPASE 202*    Recent Labs  09/15/13 0830 09/19/13 1520  AMMONIA 69* 53   CBC:  Recent Labs  08/06/13 0936  09/15/13 0754  09/26/13 0506 09/27/13 0530 09/28/13 0530  WBC 10.9*  --  14.7*  < > 19.6* 9.6 9.6  NEUTROABS 8.2*  --  6.7  --   --   --   --   HGB 15.4  < > 11.9*  < > 8.8* 8.0* 8.0*  HCT 45.7  < > 35.5*  < > 25.6* 23.9* 23.9*  MCV 92.9  --  90.1  < > 88.0 86.9 86.3  PLT 309  --  242  < > 203 319 443*  < > = values in this interval not displayed.  Cardiac Enzymes:  Recent Labs  09/15/13 1754 09/15/13 1755 09/16/13 0037 09/19/13 2300  CKTOTAL  --  94  --   --   TROPONINI <0.30  --  <0.30 <0.30   CBG:  Recent Labs  09/28/13 0754 09/28/13 1143 09/28/13 1613  GLUCAP 102* 129* 93    EXAM: RENAL/URINARY TRACT ULTRASOUND COMPLETE   COMPARISON:  CT 06/12/2005   FINDINGS: Right Kidney:   Length: 12.8 cm. Echogenicity within  normal limits. No mass or hydronephrosis visualized.   Left Kidney:   Length: 12.1 cm. Normal renal cortical thickness and echogenicity. No hydronephrosis. There is a 2.9 cm cyst within the inferior pole of the left kidney and a 2.6 cm cyst within the interpolar region left kidney.   Bladder:   Decompressed with Foley catheter   IMPRESSION: No hydronephrosis. EXAM: CT HEAD WITHOUT CONTRAST   TECHNIQUE: Contiguous axial images were obtained from the base of the skull through the vertex without intravenous contrast.   COMPARISON:  None.   FINDINGS: Motion artifact. No intra-axial or extra-axial pathologic fold blood collection. No mass lesion. No hydrocephalus. No hemorrhage. Orbits are unremarkable. Mucosal thickening noted in frontal and ethmoid sinuses consistent with mild sinusitis. No significant fluid collections in the mastoids. No acute bony abnormality.   IMPRESSION: 1. No acute intracranial abnormality. 2. Mild mucosal thickening noted in the frontal and ethmoidal sinuses consistent with mild sinusitis. Sinusitis may be chronic.   EXAM: MRI HEAD WITHOUT AND WITH CONTRAST   TECHNIQUE: Multiplanar, multiecho pulse sequences of the brain and surrounding structures were obtained without and with intravenous contrast.   CONTRAST:  44mL MULTIHANCE GADOBENATE DIMEGLUMINE 529 MG/ML IV SOLN   COMPARISON:  Head CT 09/19/2013   FINDINGS: Dedicated thin section imaging through the temporal lobes demonstrates normal volume and signal of the hippocampi. There is no evidence of heterotopia.   There is no evidence of acute infarct, intracranial hemorrhage, mass, midline shift, or extra-axial fluid collection. Minimal periventricular white matter T2 hyperintensity is nonspecific and less than is often seen in patients of this age. There is mild generalized cerebral atrophy. No abnormal enhancement is identified, although postcontrast images are moderately degraded by  motion.   Orbits are unremarkable. There is moderate bilateral ethmoid air cell mucosal thickening, with mild mucosal thickening in the remainder of the paranasal sinuses. Small bilateral mastoid effusions are present. Major intracranial vascular flow voids are preserved.   IMPRESSION: 1. No acute intracranial abnormality identified. 2. Mild cerebral atrophy. 3. Moderate paranasal sinus inflammatory mucosal disease and small bilateral mastoid effusions. EXAM: PORTABLE CHEST - 1 VIEW   COMPARISON:  09/20/2013.   FINDINGS: Interim extubation. Right IJ line stable position. Mediastinum and hilar structures are stable. Bibasilar atelectasis. No pleural effusion pneumothorax. Heart size stable. No acute osseous abnormality. Mediastinum and hilar structures are normal.   IMPRESSION: 1. Interim extubation.  Right IJ line in stable position. 2. Bibasilar  atelectasis. EXAM: US ABDOMEN LIMITED - RIGHT UPPER QUADRANT   COMPARISON:  No priors.   FINDINGS: Gallbladder:   Status post cholecystectomy.   Common bile duct:   Diameter: 5 mm in the porta hepatis.   Liver:   No focal lesion identified. Within normal limits in parenchymal echogenicity.   Other Findings:   Small right pleural effusion.   IMPRESSION: 1. No acute findings in the abdomen. 2. Small right pleural effusion. 3. Status post cholecystectomy. EXAM: CHEST - 2 VIEW   COMPARISON:  09/22/2013   FINDINGS: New right PICC line tip SVC RA junction. Ventricular atrial shunt tubing noted as before. Normal heart size and vascularity. Stable ectatic tortuous aorta. Lungs remain clear. Improving lung volumes and resolving atelectasis. No effusion or pneumothorax. Trachea midline. Prior cholecystectomy noted.   IMPRESSION: Interval right PICC line insertion, tip SVC RA junction.   Improving lung volumes with resolving atelectasis.   No acute airspace process or edema.   ASSESSMENT/PLAN:  Diabetes  mellitus, type II - well controlled; continue Lantus and NovoLog Right lower extremity DVT - continue Xarelto Chronic anxiety - stable; continue Xanax when necessary Chronic kidney disease, stage III - check BMP Deconditioning - for rehabilitation    CPT CODE: 30865  Erbie Arment Vargas - NP [facility] [phone]

## 2013-09-29 NOTE — Progress Notes (Signed)
CARE MANAGEMENT NOTE 09/29/2013  Patient:  KYSON, KUPPER   Account Number:  [id number]  Date Initiated:  09/18/2013  Documentation initiated by:  Ricki Miller  Subjective/Objective Assessment:   68 yr old male admitted with Severe sepsis.     Action/Plan:   Anticipated DC Date:  09/27/2013   Anticipated DC Plan:  [place]  CM consult      Choice offered to / List presented to:             Status of service:  Completed, signed off Medicare Important Message given?  YES (If response is "NO", the following Medicare IM given date fields will be blank) Date Medicare IM given:  09/18/2013 Medicare IM given by:  Ricki Miller Date Additional Medicare IM given:  09/21/2013 Additional Medicare IM given by:  [address]  Discharge Disposition:  [place]  Per UR Regulation:  Reviewed for med. necessity/level of care/duration of stay  If discussed at [place] of Stay Meetings, dates discussed:    Comments:  ContactDerrill, Bagnell Spouse [phone]  09/28/2013  [address] RN, [place]  [phone] discharged to [facility] 09/28/2013  09/27/2013 1030 Medicare IM given. Jonnie Finner RN CCM Case Mgmt phone [phone]  09-21-13 12noon - Luz Lex, RNBSN [phone] Extubated - continues with temp  - unknown source. Multiple testing for source.  ID consulting.  09-20-13 - 8:30am Luz Lex, RNBSN [phone] Agitation - twitching.  Moved to ICU - precedex drip and intubated.  Neuro plan for LP.

## 2013-10-01 LAB — CULTURE, BLOOD (ROUTINE X 2)
Culture: NO GROWTH
Culture: NO GROWTH

## 2013-10-03 ENCOUNTER — Non-Acute Institutional Stay (SKILLED_NURSING_FACILITY): Payer: Medicare Other | Admitting: Internal Medicine

## 2013-10-03 DIAGNOSIS — E1129 Type 2 diabetes mellitus with other diabetic kidney complication: Secondary | ICD-10-CM

## 2013-10-03 DIAGNOSIS — N183 Chronic kidney disease, stage 3 unspecified: Secondary | ICD-10-CM

## 2013-10-03 DIAGNOSIS — R748 Abnormal levels of other serum enzymes: Secondary | ICD-10-CM

## 2013-10-03 DIAGNOSIS — I82409 Acute embolism and thrombosis of unspecified deep veins of unspecified lower extremity: Secondary | ICD-10-CM

## 2013-10-03 DIAGNOSIS — I82402 Acute embolism and thrombosis of unspecified deep veins of left lower extremity: Secondary | ICD-10-CM

## 2013-10-04 ENCOUNTER — Emergency Department (HOSPITAL_COMMUNITY): Payer: Medicare Other

## 2013-10-04 ENCOUNTER — Encounter (HOSPITAL_COMMUNITY): Payer: Self-pay | Admitting: Emergency Medicine

## 2013-10-04 ENCOUNTER — Inpatient Hospital Stay (HOSPITAL_COMMUNITY)
Admission: EM | Admit: 2013-10-04 | Discharge: 2013-10-12 | DRG: 870 | Disposition: E | Payer: Medicare Other | Source: Skilled Nursing Facility | Attending: Internal Medicine | Admitting: Internal Medicine

## 2013-10-04 DIAGNOSIS — R509 Fever, unspecified: Secondary | ICD-10-CM | POA: Diagnosis present

## 2013-10-04 DIAGNOSIS — N170 Acute kidney failure with tubular necrosis: Secondary | ICD-10-CM | POA: Diagnosis present

## 2013-10-04 DIAGNOSIS — Z7901 Long term (current) use of anticoagulants: Secondary | ICD-10-CM | POA: Diagnosis not present

## 2013-10-04 DIAGNOSIS — J189 Pneumonia, unspecified organism: Secondary | ICD-10-CM

## 2013-10-04 DIAGNOSIS — I10 Essential (primary) hypertension: Secondary | ICD-10-CM | POA: Diagnosis present

## 2013-10-04 DIAGNOSIS — Z79899 Other long term (current) drug therapy: Secondary | ICD-10-CM | POA: Diagnosis not present

## 2013-10-04 DIAGNOSIS — J15211 Pneumonia due to Methicillin susceptible Staphylococcus aureus: Secondary | ICD-10-CM | POA: Diagnosis present

## 2013-10-04 DIAGNOSIS — R6520 Severe sepsis without septic shock: Secondary | ICD-10-CM

## 2013-10-04 DIAGNOSIS — R791 Abnormal coagulation profile: Secondary | ICD-10-CM | POA: Diagnosis present

## 2013-10-04 DIAGNOSIS — E8729 Other acidosis: Secondary | ICD-10-CM

## 2013-10-04 DIAGNOSIS — I82402 Acute embolism and thrombosis of unspecified deep veins of left lower extremity: Secondary | ICD-10-CM

## 2013-10-04 DIAGNOSIS — R6521 Severe sepsis with septic shock: Secondary | ICD-10-CM

## 2013-10-04 DIAGNOSIS — A419 Sepsis, unspecified organism: Secondary | ICD-10-CM

## 2013-10-04 DIAGNOSIS — Z515 Encounter for palliative care: Secondary | ICD-10-CM

## 2013-10-04 DIAGNOSIS — J9600 Acute respiratory failure, unspecified whether with hypoxia or hypercapnia: Secondary | ICD-10-CM | POA: Diagnosis present

## 2013-10-04 DIAGNOSIS — G9341 Metabolic encephalopathy: Secondary | ICD-10-CM | POA: Diagnosis present

## 2013-10-04 DIAGNOSIS — J1520 Pneumonia due to staphylococcus, unspecified: Secondary | ICD-10-CM

## 2013-10-04 DIAGNOSIS — I9589 Other hypotension: Secondary | ICD-10-CM

## 2013-10-04 DIAGNOSIS — E119 Type 2 diabetes mellitus without complications: Secondary | ICD-10-CM | POA: Diagnosis present

## 2013-10-04 DIAGNOSIS — R7402 Elevation of levels of lactic acid dehydrogenase (LDH): Secondary | ICD-10-CM | POA: Diagnosis present

## 2013-10-04 DIAGNOSIS — Z66 Do not resuscitate: Secondary | ICD-10-CM | POA: Diagnosis not present

## 2013-10-04 DIAGNOSIS — E872 Acidosis, unspecified: Secondary | ICD-10-CM

## 2013-10-04 DIAGNOSIS — R7401 Elevation of levels of liver transaminase levels: Secondary | ICD-10-CM | POA: Diagnosis present

## 2013-10-04 DIAGNOSIS — G9340 Encephalopathy, unspecified: Secondary | ICD-10-CM

## 2013-10-04 DIAGNOSIS — I825Z9 Chronic embolism and thrombosis of unspecified deep veins of unspecified distal lower extremity: Secondary | ICD-10-CM | POA: Diagnosis present

## 2013-10-04 DIAGNOSIS — J151 Pneumonia due to Pseudomonas: Secondary | ICD-10-CM | POA: Diagnosis present

## 2013-10-04 DIAGNOSIS — R627 Adult failure to thrive: Secondary | ICD-10-CM | POA: Diagnosis present

## 2013-10-04 DIAGNOSIS — Z794 Long term (current) use of insulin: Secondary | ICD-10-CM | POA: Diagnosis not present

## 2013-10-04 DIAGNOSIS — J9601 Acute respiratory failure with hypoxia: Secondary | ICD-10-CM

## 2013-10-04 DIAGNOSIS — R748 Abnormal levels of other serum enzymes: Secondary | ICD-10-CM

## 2013-10-04 DIAGNOSIS — F17200 Nicotine dependence, unspecified, uncomplicated: Secondary | ICD-10-CM | POA: Diagnosis present

## 2013-10-04 DIAGNOSIS — D649 Anemia, unspecified: Secondary | ICD-10-CM | POA: Diagnosis present

## 2013-10-04 DIAGNOSIS — R7881 Bacteremia: Secondary | ICD-10-CM

## 2013-10-04 DIAGNOSIS — A4101 Sepsis due to Methicillin susceptible Staphylococcus aureus: Principal | ICD-10-CM | POA: Diagnosis present

## 2013-10-04 DIAGNOSIS — R740 Nonspecific elevation of levels of transaminase and lactic acid dehydrogenase [LDH]: Secondary | ICD-10-CM

## 2013-10-04 DIAGNOSIS — N179 Acute kidney failure, unspecified: Secondary | ICD-10-CM

## 2013-10-04 DIAGNOSIS — B9561 Methicillin susceptible Staphylococcus aureus infection as the cause of diseases classified elsewhere: Secondary | ICD-10-CM

## 2013-10-04 DIAGNOSIS — F411 Generalized anxiety disorder: Secondary | ICD-10-CM | POA: Diagnosis present

## 2013-10-04 LAB — CBC WITH DIFFERENTIAL/PLATELET
BAND NEUTROPHILS: 0 % (ref 0–10)
BASOS ABS: 0 10*3/uL (ref 0.0–0.1)
BASOS PCT: 0 % (ref 0–1)
BLASTS: 0 %
Band Neutrophils: 0 % (ref 0–10)
Basophils Absolute: 0 10*3/uL (ref 0.0–0.1)
Basophils Relative: 0 % (ref 0–1)
Blasts: 0 %
EOS ABS: 0.3 10*3/uL (ref 0.0–0.7)
Eosinophils Absolute: 0.4 10*3/uL (ref 0.0–0.7)
Eosinophils Relative: 1 % (ref 0–5)
Eosinophils Relative: 1 % (ref 0–5)
HCT: 27.4 % — ABNORMAL LOW (ref 39.0–52.0)
HEMATOCRIT: 27.2 % — AB (ref 39.0–52.0)
HEMOGLOBIN: 9.4 g/dL — AB (ref 13.0–17.0)
Hemoglobin: 8.9 g/dL — ABNORMAL LOW (ref 13.0–17.0)
LYMPHS ABS: 3.7 10*3/uL (ref 0.7–4.0)
LYMPHS PCT: 12 % (ref 12–46)
Lymphocytes Relative: 9 % — ABNORMAL LOW (ref 12–46)
Lymphs Abs: 3.2 10*3/uL (ref 0.7–4.0)
MCH: 31.5 pg (ref 26.0–34.0)
MCH: 30.2 pg (ref 26.0–34.0)
MCHC: 34.3 g/dL (ref 30.0–36.0)
MCHC: 32.7 g/dL (ref 30.0–36.0)
MCV: 91.9 fL (ref 78.0–100.0)
MCV: 92.2 fL (ref 78.0–100.0)
METAMYELOCYTES PCT: 0 %
MONOS PCT: 4 % (ref 3–12)
MYELOCYTES: 0 %
Metamyelocytes Relative: 0 %
Monocytes Absolute: 1.2 10*3/uL — ABNORMAL HIGH (ref 0.1–1.0)
Monocytes Absolute: 1.1 10*3/uL — ABNORMAL HIGH (ref 0.1–1.0)
Monocytes Relative: 3 % (ref 3–12)
Myelocytes: 1 %
NEUTROS ABS: 25.8 10*3/uL — AB (ref 1.7–7.7)
NRBC: 3 /100{WBCs} — AB
Neutro Abs: 30.7 10*3/uL — ABNORMAL HIGH (ref 1.7–7.7)
Neutrophils Relative %: 82 % — ABNORMAL HIGH (ref 43–77)
Neutrophils Relative %: 87 % — ABNORMAL HIGH (ref 43–77)
PLATELETS: 433 10*3/uL — AB (ref 150–400)
PROMYELOCYTES ABS: 0 %
Platelets: 386 10*3/uL (ref 150–400)
Promyelocytes Absolute: 0 %
RBC: 2.98 MIL/uL — ABNORMAL LOW (ref 4.22–5.81)
RBC: 2.95 MIL/uL — ABNORMAL LOW (ref 4.22–5.81)
RDW: 17.2 % — ABNORMAL HIGH (ref 11.5–15.5)
RDW: 17.4 % — AB (ref 11.5–15.5)
WBC Morphology: INCREASED
WBC: 31 10*3/uL — AB (ref 4.0–10.5)
WBC: 35.4 10*3/uL — AB (ref 4.0–10.5)
nRBC: 0 /100 WBC

## 2013-10-04 LAB — COMPREHENSIVE METABOLIC PANEL
ALK PHOS: 368 U/L — AB (ref 39–117)
ALT: 156 U/L — ABNORMAL HIGH (ref 0–53)
ALT: 147 U/L — AB (ref 0–53)
ANION GAP: 15 (ref 5–15)
AST: 111 U/L — ABNORMAL HIGH (ref 0–37)
AST: 118 U/L — AB (ref 0–37)
Albumin: 1.8 g/dL — ABNORMAL LOW (ref 3.5–5.2)
Albumin: 1.6 g/dL — ABNORMAL LOW (ref 3.5–5.2)
Alkaline Phosphatase: 414 U/L — ABNORMAL HIGH (ref 39–117)
Anion gap: 13 (ref 5–15)
BUN: 35 mg/dL — AB (ref 6–23)
BUN: 38 mg/dL — ABNORMAL HIGH (ref 6–23)
CALCIUM: 7.5 mg/dL — AB (ref 8.4–10.5)
CALCIUM: 6.9 mg/dL — AB (ref 8.4–10.5)
CO2: 15 mEq/L — ABNORMAL LOW (ref 19–32)
CO2: 15 meq/L — AB (ref 19–32)
CREATININE: 2.73 mg/dL — AB (ref 0.50–1.35)
Chloride: 110 mEq/L (ref 96–112)
Chloride: 113 mEq/L — ABNORMAL HIGH (ref 96–112)
Creatinine, Ser: 2.39 mg/dL — ABNORMAL HIGH (ref 0.50–1.35)
GFR calc non Af Amer: 26 mL/min — ABNORMAL LOW (ref >90)
GFR, EST AFRICAN AMERICAN: 30 mL/min — AB (ref >90)
GFR, EST AFRICAN AMERICAN: 26 mL/min — AB (ref >90)
GFR, EST NON AFRICAN AMERICAN: 22 mL/min — AB (ref >90)
Glucose, Bld: 94 mg/dL (ref 70–99)
Glucose, Bld: 141 mg/dL — ABNORMAL HIGH (ref 70–99)
POTASSIUM: 5 meq/L (ref 3.7–5.3)
Potassium: 4.7 mEq/L (ref 3.7–5.3)
SODIUM: 141 meq/L (ref 137–147)
Sodium: 140 mEq/L (ref 137–147)
TOTAL PROTEIN: 5.9 g/dL — AB (ref 6.0–8.3)
Total Bilirubin: 1.4 mg/dL — ABNORMAL HIGH (ref 0.3–1.2)
Total Bilirubin: 1.5 mg/dL — ABNORMAL HIGH (ref 0.3–1.2)
Total Protein: 6.3 g/dL (ref 6.0–8.3)

## 2013-10-04 LAB — I-STAT ARTERIAL BLOOD GAS, ED
Acid-base deficit: 11 mmol/L — ABNORMAL HIGH (ref 0.0–2.0)
Acid-base deficit: 10 mmol/L — ABNORMAL HIGH (ref 0.0–2.0)
BICARBONATE: 16.3 meq/L — AB (ref 20.0–24.0)
BICARBONATE: 16.8 meq/L — AB (ref 20.0–24.0)
O2 SAT: 100 %
O2 Saturation: 88 %
PH ART: 7.201 — AB (ref 7.350–7.450)
PO2 ART: 417 mmHg — AB (ref 80.0–100.0)
Patient temperature: 98.6
Patient temperature: 99.9
TCO2: 18 mmol/L (ref 0–100)
TCO2: 18 mmol/L (ref 0–100)
pCO2 arterial: 41.6 mmHg (ref 35.0–45.0)
pCO2 arterial: 43.4 mmHg (ref 35.0–45.0)
pH, Arterial: 7.199 — CL (ref 7.350–7.450)
pO2, Arterial: 67 mmHg — ABNORMAL LOW (ref 80.0–100.0)

## 2013-10-04 LAB — URINE MICROSCOPIC-ADD ON

## 2013-10-04 LAB — PRO B NATRIURETIC PEPTIDE: Pro B Natriuretic peptide (BNP): 924.9 pg/mL — ABNORMAL HIGH (ref 0–125)

## 2013-10-04 LAB — PROTIME-INR
INR: 6.04 (ref 0.00–1.49)
PROTHROMBIN TIME: 53.8 s — AB (ref 11.6–15.2)

## 2013-10-04 LAB — URINALYSIS, ROUTINE W REFLEX MICROSCOPIC
Glucose, UA: NEGATIVE mg/dL
Ketones, ur: NEGATIVE mg/dL
Leukocytes, UA: NEGATIVE
Nitrite: NEGATIVE
PH: 5 (ref 5.0–8.0)
Protein, ur: 30 mg/dL — AB
SPECIFIC GRAVITY, URINE: 1.022 (ref 1.005–1.030)
UROBILINOGEN UA: 0.2 mg/dL (ref 0.0–1.0)

## 2013-10-04 LAB — PHOSPHORUS: Phosphorus: 6.7 mg/dL — ABNORMAL HIGH (ref 2.3–4.6)

## 2013-10-04 LAB — LACTIC ACID, PLASMA: Lactic Acid, Venous: 3.2 mmol/L — ABNORMAL HIGH (ref 0.5–2.2)

## 2013-10-04 LAB — MAGNESIUM: MAGNESIUM: 1.8 mg/dL (ref 1.5–2.5)

## 2013-10-04 LAB — I-STAT TROPONIN, ED: Troponin i, poc: 0.02 ng/mL (ref 0.00–0.08)

## 2013-10-04 LAB — PROCALCITONIN: Procalcitonin: 26.82 ng/mL

## 2013-10-04 LAB — TROPONIN I

## 2013-10-04 LAB — MRSA PCR SCREENING: MRSA BY PCR: POSITIVE — AB

## 2013-10-04 LAB — GLUCOSE, CAPILLARY: GLUCOSE-CAPILLARY: 103 mg/dL — AB (ref 70–99)

## 2013-10-04 LAB — I-STAT CG4 LACTIC ACID, ED: Lactic Acid, Venous: 2.79 mmol/L — ABNORMAL HIGH (ref 0.5–2.2)

## 2013-10-04 MED ORDER — FENTANYL CITRATE 0.05 MG/ML IJ SOLN
50.0000 ug | Freq: Once | INTRAMUSCULAR | Status: DC
  Filled 2013-10-04: qty 2

## 2013-10-04 MED ORDER — SODIUM CHLORIDE 0.9 % IV BOLUS (SEPSIS)
30.0000 mL/kg | Freq: Once | INTRAVENOUS | Status: AC
  Administered 2013-10-04: 2667 mL via INTRAVENOUS

## 2013-10-04 MED ORDER — ASPIRIN 300 MG RE SUPP: 300.0000 mg | RECTAL | Status: DC

## 2013-10-04 MED ORDER — ENOXAPARIN SODIUM 30 MG/0.3ML ~~LOC~~ SOLN: 30.0000 mg | SUBCUTANEOUS | Status: DC

## 2013-10-04 MED ORDER — MIDAZOLAM HCL 2 MG/2ML IJ SOLN
INTRAMUSCULAR | Status: AC
  Filled 2013-10-04: qty 2

## 2013-10-04 MED ORDER — ACETAMINOPHEN 325 MG PO TABS
650.0000 mg | ORAL_TABLET | Freq: Four times a day (QID) | ORAL | Status: DC | PRN
  Administered 2013-10-04: 650 mg via ORAL
  Filled 2013-10-04: qty 2

## 2013-10-04 MED ORDER — VITAL HIGH PROTEIN PO LIQD
1000.0000 mL | ORAL | Status: DC
  Administered 2013-10-04 (×2): 1000 mL
  Administered 2013-10-05: 08:00:00
  Administered 2013-10-07: 1000 mL
  Administered 2013-10-07: 21:00:00
  Administered 2013-10-07: 1000 mL
  Administered 2013-10-08: 04:00:00
  Administered 2013-10-08 – 2013-10-09 (×2): 1000 mL
  Administered 2013-10-10: 15:00:00
  Administered 2013-10-10: 1000 mL
  Filled 2013-10-04 (×8): qty 1000

## 2013-10-04 MED ORDER — SODIUM CHLORIDE 0.9 % IV SOLN
1000.0000 mL | INTRAVENOUS | Status: DC
  Administered 2013-10-04: 1000 mL via INTRAVENOUS

## 2013-10-04 MED ORDER — VANCOMYCIN HCL IN DEXTROSE 750-5 MG/150ML-% IV SOLN
750.0000 mg | Freq: Two times a day (BID) | INTRAVENOUS | Status: DC
  Filled 2013-10-04: qty 150

## 2013-10-04 MED ORDER — HEPARIN (PORCINE) IN NACL 100-0.45 UNIT/ML-% IJ SOLN
1550.0000 [IU]/h | INTRAMUSCULAR | Status: DC
  Administered 2013-10-04: 1450 [IU]/h via INTRAVENOUS
  Filled 2013-10-04 (×2): qty 250

## 2013-10-04 MED ORDER — ETOMIDATE 2 MG/ML IV SOLN
INTRAVENOUS | Status: AC
  Administered 2013-10-04: 20 mg
  Filled 2013-10-04: qty 20

## 2013-10-04 MED ORDER — SODIUM CHLORIDE 0.9 % IV SOLN
0.0000 ug/h | INTRAVENOUS | Status: DC
  Administered 2013-10-04: 50 ug/h via INTRAVENOUS
  Administered 2013-10-05: 400 ug/h via INTRAVENOUS
  Administered 2013-10-06: 100 ug/h via INTRAVENOUS
  Administered 2013-10-08: 175 ug/h via INTRAVENOUS
  Administered 2013-10-08: 125 ug/h via INTRAVENOUS
  Administered 2013-10-08: 150 ug/h via INTRAVENOUS
  Administered 2013-10-09 (×2): 50 ug/h via INTRAVENOUS
  Administered 2013-10-09: 150 ug/h via INTRAVENOUS
  Administered 2013-10-09 (×4): 50 ug/h via INTRAVENOUS
  Administered 2013-10-09: 125 ug/h via INTRAVENOUS
  Administered 2013-10-09 (×2): 250 ug/h via INTRAVENOUS
  Administered 2013-10-09: 50 ug/h via INTRAVENOUS
  Administered 2013-10-10: 400 ug/h via INTRAVENOUS
  Filled 2013-10-04 (×10): qty 50

## 2013-10-04 MED ORDER — VANCOMYCIN HCL IN DEXTROSE 1-5 GM/200ML-% IV SOLN
1000.0000 mg | Freq: Once | INTRAVENOUS | Status: AC
  Administered 2013-10-04: 1000 mg via INTRAVENOUS
  Filled 2013-10-04: qty 200

## 2013-10-04 MED ORDER — SENNOSIDES-DOCUSATE SODIUM 8.6-50 MG PO TABS
1.0000 | ORAL_TABLET | Freq: Two times a day (BID) | ORAL | Status: DC | PRN
  Filled 2013-10-04: qty 1

## 2013-10-04 MED ORDER — FENTANYL BOLUS VIA INFUSION
25.0000 ug | INTRAVENOUS | Status: DC | PRN
  Administered 2013-10-08 – 2013-10-10 (×5): 50 ug via INTRAVENOUS
  Filled 2013-10-04: qty 50

## 2013-10-04 MED ORDER — NOREPINEPHRINE BITARTRATE 1 MG/ML IV SOLN
2.0000 ug/min | Freq: Once | INTRAVENOUS | Status: AC
  Administered 2013-10-04: 4 ug/min via INTRAVENOUS
  Filled 2013-10-04: qty 4

## 2013-10-04 MED ORDER — MIDAZOLAM HCL 2 MG/2ML IJ SOLN
1.0000 mg | INTRAMUSCULAR | Status: DC | PRN
  Administered 2013-10-06 – 2013-10-10 (×23): 1 mg via INTRAVENOUS
  Filled 2013-10-04 (×22): qty 2

## 2013-10-04 MED ORDER — NOREPINEPHRINE BITARTRATE 1 MG/ML IV SOLN
2.0000 ug/min | INTRAVENOUS | Status: DC
  Administered 2013-10-04: 32 ug/min via INTRAVENOUS
  Administered 2013-10-05: 50 ug/min via INTRAVENOUS
  Administered 2013-10-05: 40 ug/min via INTRAVENOUS
  Administered 2013-10-05: 50 ug/min via INTRAVENOUS
  Administered 2013-10-06: 25 ug/min via INTRAVENOUS
  Administered 2013-10-06: 30 ug/min via INTRAVENOUS
  Administered 2013-10-06 – 2013-10-07 (×2): 40 ug/min via INTRAVENOUS
  Administered 2013-10-08: 15 ug/min via INTRAVENOUS
  Administered 2013-10-09: 8 ug/min via INTRAVENOUS
  Administered 2013-10-09: 2 ug/min via INTRAVENOUS
  Administered 2013-10-09: 4 ug/min via INTRAVENOUS
  Filled 2013-10-04 (×11): qty 16

## 2013-10-04 MED ORDER — LIDOCAINE HCL (CARDIAC) 20 MG/ML IV SOLN
INTRAVENOUS | Status: AC
  Filled 2013-10-04: qty 5

## 2013-10-04 MED ORDER — NOREPINEPHRINE BITARTRATE 1 MG/ML IV SOLN
2.0000 ug/min | INTRAVENOUS | Status: DC
  Filled 2013-10-04 (×2): qty 4

## 2013-10-04 MED ORDER — FAMOTIDINE IN NACL 20-0.9 MG/50ML-% IV SOLN
20.0000 mg | Freq: Two times a day (BID) | INTRAVENOUS | Status: DC
  Administered 2013-10-04 – 2013-10-05 (×2): 20 mg via INTRAVENOUS
  Filled 2013-10-04 (×3): qty 50

## 2013-10-04 MED ORDER — MIDAZOLAM HCL 2 MG/2ML IJ SOLN
2.0000 mg | Freq: Once | INTRAMUSCULAR | Status: AC
  Administered 2013-10-04: 2 mg via INTRAVENOUS

## 2013-10-04 MED ORDER — SODIUM CHLORIDE 0.9 % IV SOLN
INTRAVENOUS | Status: DC
  Administered 2013-10-04: 125 mL/h via INTRAVENOUS

## 2013-10-04 MED ORDER — FENTANYL CITRATE 0.05 MG/ML IJ SOLN
100.0000 ug | Freq: Once | INTRAMUSCULAR | Status: AC
  Administered 2013-10-04: 100 ug via INTRAVENOUS

## 2013-10-04 MED ORDER — PIPERACILLIN-TAZOBACTAM 3.375 G IVPB
3.3750 g | Freq: Three times a day (TID) | INTRAVENOUS | Status: DC
  Administered 2013-10-04 – 2013-10-05 (×2): 3.375 g via INTRAVENOUS
  Filled 2013-10-04 (×3): qty 50

## 2013-10-04 MED ORDER — FENTANYL CITRATE 0.05 MG/ML IJ SOLN
INTRAMUSCULAR | Status: AC
  Filled 2013-10-04: qty 2

## 2013-10-04 MED ORDER — VANCOMYCIN HCL IN DEXTROSE 750-5 MG/150ML-% IV SOLN
750.0000 mg | Freq: Two times a day (BID) | INTRAVENOUS | Status: DC
  Administered 2013-10-05 (×2): 750 mg via INTRAVENOUS
  Filled 2013-10-04 (×3): qty 150

## 2013-10-04 MED ORDER — SODIUM CHLORIDE 0.9 % IV SOLN: 250.0000 mL | INTRAVENOUS | Status: DC | PRN

## 2013-10-04 MED ORDER — MUPIROCIN 2 % EX OINT
1.0000 "application " | TOPICAL_OINTMENT | Freq: Two times a day (BID) | CUTANEOUS | Status: AC
  Administered 2013-10-04 – 2013-10-09 (×10): 1 via NASAL
  Filled 2013-10-04: qty 22

## 2013-10-04 MED ORDER — CHLORHEXIDINE GLUCONATE CLOTH 2 % EX PADS
6.0000 | MEDICATED_PAD | Freq: Every day | CUTANEOUS | Status: AC
  Administered 2013-10-05 – 2013-10-09 (×5): 6 via TOPICAL

## 2013-10-04 MED ORDER — ROCURONIUM BROMIDE 50 MG/5ML IV SOLN
INTRAVENOUS | Status: AC
  Filled 2013-10-04: qty 2

## 2013-10-04 MED ORDER — CHLORHEXIDINE GLUCONATE 0.12 % MT SOLN
15.0000 mL | Freq: Two times a day (BID) | OROMUCOSAL | Status: DC
  Administered 2013-10-04 – 2013-10-10 (×11): 15 mL via OROMUCOSAL
  Filled 2013-10-04 (×10): qty 15

## 2013-10-04 MED ORDER — DEXTROSE 5 % IV SOLN: 1.0000 g | Freq: Two times a day (BID) | INTRAVENOUS | Status: DC

## 2013-10-04 MED ORDER — CETYLPYRIDINIUM CHLORIDE 0.05 % MT LIQD
7.0000 mL | Freq: Four times a day (QID) | OROMUCOSAL | Status: DC
  Administered 2013-10-04 – 2013-10-10 (×25): 7 mL via OROMUCOSAL

## 2013-10-04 MED ORDER — MIDAZOLAM HCL 2 MG/2ML IJ SOLN
1.0000 mg | INTRAMUSCULAR | Status: AC | PRN
  Administered 2013-10-04 – 2013-10-08 (×3): 1 mg via INTRAVENOUS
  Filled 2013-10-04 (×3): qty 2

## 2013-10-04 MED ORDER — CEFEPIME HCL 2 G IJ SOLR
2.0000 g | Freq: Once | INTRAMUSCULAR | Status: AC
  Administered 2013-10-04: 2 g via INTRAVENOUS
  Filled 2013-10-04: qty 2

## 2013-10-04 MED ORDER — SUCCINYLCHOLINE CHLORIDE 20 MG/ML IJ SOLN
INTRAMUSCULAR | Status: AC
  Filled 2013-10-04: qty 1

## 2013-10-04 MED ORDER — INSULIN REGULAR HUMAN 100 UNIT/ML IJ SOLN
INTRAMUSCULAR | Status: DC
  Filled 2013-10-04: qty 2.5

## 2013-10-04 MED ORDER — ASPIRIN 81 MG PO CHEW: 324.0000 mg | CHEWABLE_TABLET | ORAL | Status: DC

## 2013-10-04 NOTE — ED Notes (Signed)
Pt's IV occluded, Dr. Tomi Bamberger at bedside to assess.

## 2013-10-04 NOTE — ED Notes (Signed)
Pt c/o increasing Shortness of breath, Resp Rate of 35-40. Respiratory Therapist at bedside applying BiPap

## 2013-10-04 NOTE — ED Notes (Addendum)
Pt removing BiPap Mask and refusing to wear BiPap at this time, respiratory at bedside.

## 2013-10-04 NOTE — ED Notes (Signed)
Xray notified of need for Stat Portable CXR s/p Central Line Placement.

## 2013-10-04 NOTE — Procedures (Signed)
09/12/2013 There are other unrelated non-urgent complaints, but due to the busy schedule and the amount of time I've already spent with him, time does not permit me to address these routine issues at today's visit. I've requested another appointment to review these additional issues.  Procedure Note:  Arterial Line Placement Paula Compton , [phone] , [id number]-[id number]  Indications:  Hemodynamic instability / recurrent ABG draws  Emergent placement of arterial line, RIGHT FEMORAL  Time out was performed verifying correct patient, procedure, site, positioning, and special catheter was available at the time of procedure.  Patient's RIGHT FEMORAL artery was prepped and draped in usual sterile fashion.  1 % Lidocaine WAS used to anesthetize the area.  Total number of attempts were 1, using ultrasound.  A 20 gauge arterial line was introduced into the RIGHT FEMORAL artery.  Catheter threaded and the needle was removed with appropriate blood return.  Blood loss was minimal.  Patient tolerated the procedure well, and there were no complications.    PA Rahul Desai performed the procedure under direct supervision of Dr. Netta Neat, MD [facility] Pager [phone] On Call Pager [phone]

## 2013-10-04 NOTE — ED Notes (Signed)
Rahul Desai CCPA and CCMD at bedside to intubate patient.

## 2013-10-04 NOTE — Progress Notes (Signed)
ANTIBIOTIC CONSULT NOTE - INITIAL  Pharmacy Consult for Vanco/Zosyn + IV heparin Indication: Sepsis/ HCAP + DVT  No Known Allergies  Patient Measurements: Height: 6' (182.9 cm) Weight: 196 lb (88.905 kg) IBW/kg (Calculated) : 77.6 Adjusted Body Weight:   Heparin dosing weight 88.7 kg  Vital Signs: Temp: 98.8 F (37.1 C) (09/23 1330) Temp src: Rectal (09/23 1330) BP: 82/63 mmHg (09/23 1604) Pulse Rate: 130 (09/23 1524) Intake/Output from previous day:   Intake/Output from this shift:    Labs:  Recent Labs  09/29/2013 1135  WBC 31.0*  HGB 9.4*  PLT 386  CREATININE 2.39*   Estimated Creatinine Clearance: 32.5 ml/min (by C-G formula based on Cr of 2.39). No results found for this basename: VANCOTROUGH, Corlis Leak, VANCORANDOM, GENTTROUGH, GENTPEAK, GENTRANDOM, TOBRATROUGH, TOBRAPEAK, TOBRARND, AMIKACINPEAK, AMIKACINTROU, AMIKACIN,  in the last 72 hours   Microbiology: Recent Results (from the past 720 hour(s))  CULTURE, BLOOD (ROUTINE X 2)     Status: None   Collection Time    09/15/13 10:15 AM      Result Value Ref Range Status   Specimen Description BLOOD LEFT HAND   Final   Special Requests BOTTLES DRAWN AEROBIC AND ANAEROBIC 10CC   Final   Culture  Setup Time     Final   Value: 09/15/2013 14:27     Performed at [facility]   Culture     Final   Value: NO GROWTH 5 DAYS     Performed at [facility]   Report Status 09/21/2013 FINAL   Final  CULTURE, BLOOD (ROUTINE X 2)     Status: None   Collection Time    09/15/13 10:30 AM      Result Value Ref Range Status   Specimen Description BLOOD LEFT HAND   Final   Special Requests BOTTLES DRAWN AEROBIC AND ANAEROBIC 10CC   Final   Culture  Setup Time     Final   Value: 09/15/2013 14:28     Performed at [facility]   Culture     Final   Value: NO GROWTH 5 DAYS     Performed at [facility]   Report Status 09/21/2013 FINAL   Final  MRSA PCR SCREENING     Status: None   Collection  Time    09/15/13  4:45 PM      Result Value Ref Range Status   MRSA by PCR NEGATIVE  NEGATIVE Final   Comment:            The GeneXpert MRSA Assay (FDA     approved for NASAL specimens     only), is one component of a     comprehensive MRSA colonization     surveillance program. It is not     intended to diagnose MRSA     infection nor to guide or     monitor treatment for     MRSA infections.  CLOSTRIDIUM DIFFICILE BY PCR     Status: None   Collection Time    09/19/13  8:27 AM      Result Value Ref Range Status   C difficile by pcr NEGATIVE  NEGATIVE Final  STOOL CULTURE     Status: None   Collection Time    09/20/13  5:04 PM      Result Value Ref Range Status   Specimen Description STOOL   Final   Special Requests NONE   Final   Culture     Final  Value: NO SALMONELLA, SHIGELLA, CAMPYLOBACTER, YERSINIA, OR E.COLI 0157:H7 ISOLATED     Note: REDUCED NORMAL FLORA PRESENT     Performed at [facility]   Report Status 09/24/2013 FINAL   Final  CULTURE, EXPECTORATED SPUTUM-ASSESSMENT     Status: None   Collection Time    09/21/13 11:41 AM      Result Value Ref Range Status   Specimen Description SPUTUM   Final   Special Requests NONE   Final   Sputum evaluation     Final   Value: THIS SPECIMEN IS ACCEPTABLE. RESPIRATORY CULTURE REPORT TO FOLLOW.   Report Status 09/21/2013 FINAL   Final  CULTURE, RESPIRATORY (NON-EXPECTORATED)     Status: None   Collection Time    09/21/13 11:41 AM      Result Value Ref Range Status   Specimen Description SPUTUM   Final   Special Requests NONE   Final   Gram Stain     Final   Value: RARE WBC PRESENT,BOTH PMN AND MONONUCLEAR     RARE SQUAMOUS EPITHELIAL CELLS PRESENT     NO ORGANISMS SEEN     Performed at [facility]   Culture     Final   Value: FEW CANDIDA ALBICANS     Performed at [facility]   Report Status 09/23/2013 FINAL   Final  CULTURE, BLOOD (ROUTINE X 2)     Status: None   Collection Time     09/21/13  3:54 PM      Result Value Ref Range Status   Specimen Description BLOOD RIGHT ARM   Final   Special Requests BOTTLES DRAWN AEROBIC AND ANAEROBIC 10CC   Final   Culture  Setup Time     Final   Value: 09/21/2013 19:21     Performed at [facility]   Culture     Final   Value: NO GROWTH 5 DAYS     Performed at [facility]   Report Status 09/27/2013 FINAL   Final  CULTURE, BLOOD (ROUTINE X 2)     Status: None   Collection Time    09/21/13  4:05 PM      Result Value Ref Range Status   Specimen Description BLOOD RIGHT HAND   Final   Special Requests BOTTLES DRAWN AEROBIC ONLY 8CC   Final   Culture  Setup Time     Final   Value: 09/21/2013 19:18     Performed at [facility]   Culture     Final   Value: NO GROWTH 5 DAYS     Performed at [facility]   Report Status 09/27/2013 FINAL   Final  CSF CULTURE     Status: None   Collection Time    09/21/13  4:41 PM      Result Value Ref Range Status   Specimen Description CSF   Final   Special Requests 1.3ML CSF FLUID NO 2   Final   Gram Stain     Final   Value: WBC PRESENT, PREDOMINANTLY MONONUCLEAR     NO ORGANISMS SEEN     CYTOSPIN Performed at [facility]     Performed at [facility]   Culture     Final   Value: NO GROWTH 3 DAYS     Performed at [facility]   Report Status 09/24/2013 FINAL   Final  GRAM STAIN     Status: None   Collection Time  09/21/13  4:41 PM      Result Value Ref Range Status   Specimen Description CSF   Final   Special Requests 1.3ML CSF FLUID   Final   Gram Stain     Final   Value: WBC PRESENT, PREDOMINANTLY MONONUCLEAR     NO ORGANISMS SEEN     CYTOSPIN SLIDE   Report Status 09/21/2013 FINAL   Final  AFB CULTURE WITH SMEAR     Status: None   Collection Time    09/21/13  4:43 PM      Result Value Ref Range Status   Specimen Description CSF   Final   Special Requests 1.3ML CSF FLUID   Final   Acid Fast Smear     Final   Value: NO  ACID FAST BACILLI SEEN     Performed at [facility]   Culture     Final   Value: CULTURE WILL BE EXAMINED FOR 6 WEEKS BEFORE ISSUING A FINAL REPORT     Performed at [facility]   Report Status PENDING   Incomplete  URINE CULTURE     Status: None   Collection Time    09/25/13 11:39 AM      Result Value Ref Range Status   Specimen Description URINE, CLEAN CATCH   Final   Special Requests NONE   Final   Culture  Setup Time     Final   Value: 09/25/2013 15:01     Performed at [place]     Final   Value: 35,000 COLONIES/ML     Performed at [facility]   Culture     Final   Value: STAPHYLOCOCCUS SPECIES (COAGULASE NEGATIVE)     Note: RIFAMPIN AND GENTAMICIN SHOULD NOT BE USED AS SINGLE DRUGS FOR TREATMENT OF STAPH INFECTIONS.     Performed at [facility]   Report Status 09/29/2013 FINAL   Final   Organism ID, Bacteria STAPHYLOCOCCUS SPECIES (COAGULASE NEGATIVE)   Final  CULTURE, BLOOD (ROUTINE X 2)     Status: None   Collection Time    09/25/13  1:50 PM      Result Value Ref Range Status   Specimen Description BLOOD LEFT HAND   Final   Special Requests BOTTLES DRAWN AEROBIC AND ANAEROBIC 10CC   Final   Culture  Setup Time     Final   Value: 09/25/2013 17:11     Performed at [facility]   Culture     Final   Value: NO GROWTH 5 DAYS     Performed at [facility]   Report Status 10/01/2013 FINAL   Final  CULTURE, BLOOD (ROUTINE X 2)     Status: None   Collection Time    09/25/13  1:55 PM      Result Value Ref Range Status   Specimen Description BLOOD LEFT ARM   Final   Special Requests     Final   Value: BOTTLES DRAWN AEROBIC AND ANAEROBIC 10CC BLUE, 5CC RED   Culture  Setup Time     Final   Value: 09/25/2013 17:11     Performed at [facility]   Culture     Final   Value: NO GROWTH 5 DAYS     Performed at [facility]   Report Status 10/01/2013 FINAL   Final    Medical  History: Past Medical History  Diagnosis Date  . Hypertension   .  Diabetes mellitus without complication   . Cancer     Medications:  F/u med rec  Assessment: PNA 68 year old male with admission earlier this month (9/4 to 9/17) for acute renal failure and AMS-->severe sepsis-->dc to [facility] place. Readmitted from [facility] with fever of 101.5, SOB, cough, lethargy, and elevated WBCs to begin broad spectrum antibiotics for pneumonia. Patient intubated in ED.  Anticoagulation: Xarelto 15 mg po BID through 10/5 then 20 mg daily for DVT (dx 09/21/13). Spoke with Dr. Waldron Labs who was ok to start heparin drip. Last dose of Xarelto was documented this AM at [facility] place (time unknown).  ID: Sepsis, HCAP, recent cellulitis. LA 2.79. 9/23: Vanco>> 9/23: Zosyn>>  Cardiovascular: h/o HTN. proBNP 924. WBC 31  Endocrinology: DM. Glucose 94.  Gastrointestinal / Nutrition: Chronic diarrhea since colectomy in '06. +weight loss. LFT's elevated. IV Pepcid.  Neurology  Nephrology: +metabolic acidosis. ARF secondary to hypotension vs ACEI vs metformin vs Bactrim. K=5, Scr 2.39 (CrCl 32)  Pulmonary: Sedated on Fentanyl drip in ED.  Hematology / Oncology: h/o cancer. Hgb 9.4.  PTA Medication Issues  Best Practices  Goal of Therapy:  Vancomycin trough level 15-20 mcg/ml  Plan:  1. Cefepime just given in ED. This PM start Zosyn 3.375g IV q8hr. 2. Vancomycin 1g x 1 load, then 750mg  IV q12h. 3. This PM (when next dose of Xarelto would be due), start IV heparin with no bolus at 1450 units/hr with no bolus. 4. Daily heparin level and CBC   Jonthan Leite S. Alford Highland, PharmD, BCPS Clinical Staff Pharmacist Pager [phone]  Eilene Ghazi Stillinger 09/21/2013,4:11 PM

## 2013-10-04 NOTE — ED Notes (Signed)
Critical Care PA, Rahul, at bedside to assess patient. RT at bedside to draw ABG.

## 2013-10-04 NOTE — ED Notes (Addendum)
Pt's wife at bedside and updated by Dr. Tomi Bamberger. This RN remains at bedside.

## 2013-10-04 NOTE — Procedures (Signed)
  Procedure Note: Central Venous Catheter Placement, RIGHT FEMORAL Paula Compton , [id number] , 2M15C/2M15C-01  Indications: Hemodynamic monitoring / Intravenous access  Emergent insertion of CVL A time-out was completed verifying correct patient, procedure and site. A triple lumen catheter available at the time of procedure.  The patient was placed in a dependent position appropriate for central line placement based on the vein to be cannulated.  The patient's right groin area was prepped and draped in a sterile fashion. 1% Lidocaine WAS used to anesthetize the surrounding skin area.  A triple lumen catheter was introduced into the RIGHT FEMORAL VEIN using Seldinger technique, visualized under ultrasound guidance.  The catheter was threaded smoothly over the guide wire and appropriate blood return was obtained.  Each lumen of the catheter was evacuated of air and flushed with sterile saline.  The catheter was then sutured in place to the skin and a sterile dressing applied.  Perfusion to the extremity distal to the point of catheter insertion was checked and found to be adequate.    The patient tolerated the procedure well and there were no complications.  PA Rahul Desai performed the procedure under direct supervision of Dr. Netta Neat, MD [facility] Pager [phone] On Call Pager [phone]

## 2013-10-04 NOTE — ED Notes (Addendum)
Unable to obtain IV access x2RN. 3rd RN to attempt with [place]. Xray at bedside at this time.

## 2013-10-04 NOTE — ED Notes (Signed)
Lab results reported to Dr. Hillard Danker.

## 2013-10-04 NOTE — Progress Notes (Signed)
Pt transported on vent from ED to 2M15. No complications noted.

## 2013-10-04 NOTE — H&P (Signed)
PULMONARY / CRITICAL CARE MEDICINE   Name: LINZY DARLING MRN: [id number] DOB: 10-19-1945    ADMISSION DATE:  09/16/2013 CONSULTATION DATE:  10/07/2013  REFERRING MD :  EDP  CHIEF COMPLAINT:  Fever  INITIAL PRESENTATION: 68 y.o. M, nursing home resident [facility]) brought to [facility] ED on 9/23 with fever x 1 day to 101.5.  In ED, developed respiratory failure, failed BiPAP and required intubation.  PCCM was consulted for admission.  STUDIES:  CXR 9/23 >>> RUL PNA  SIGNIFICANT EVENTS: 9/23 - intubated while in ED (by CCM), admitted to ICU.   HISTORY OF PRESENT ILLNESS:  Pt is encephalopathic; therefore, this HPI is obtained from chart review. Mr. Virgia Land is a 68 y.o. M with PMH as outlined below who presented to [facility] ED on 9/23 with fever x 1 day up to 101.5.  He resides in a nursing home [facility]).  Of note, he was recently discharged 9/17 after 2 week admission for severe sepsis presumed from LE cellulitis. Pt reported to EDP that he had been having increased cough and congestion.  His mouth had felt very dry as if he were dehydrated.  On morning of presentation, nursing home staff noted him to be tachycardic with leukocytosis, hence they sent him to ED for further evaluation.  While in ED, pt was hypotensive with SBP in 90's, responded to IVF.  He later became dyspneic requiring BiPAP.  Unfortunately, pt was unable to tolerate BiPAP and due to worsening respiratory distress, he was intubated and admitted to ICU.  PAST MEDICAL HISTORY :  Past Medical History  Diagnosis Date  . Hypertension   . Diabetes mellitus without complication   . Cancer    Past Surgical History  Procedure Laterality Date  . Colon surgery    . Appendectomy    . Tonsillectomy    . Cholecystectomy     Prior to Admission medications   Medication Sig Start Date End Date Taking? Authorizing Provider  acetaminophen (TYLENOL) 325 MG tablet Take 650 mg by mouth every 6 (six) hours as needed for mild pain.    Yes Historical Provider, MD  ALPRAZolam Duanne Moron) 0.5 MG tablet Take 0.5 mg by mouth 3 (three) times daily as needed for anxiety.   Yes Historical Provider, MD  insulin aspart (NOVOLOG) 100 UNIT/ML injection Inject 2-15 Units into the skin 3 (three) times daily before meals. *121-150=2 units, 151-200=3 units, 201-250=5 units, 251-300=8 units, 301-350=11 units, 351-400=15 units, >400 call MD/NP*   Yes Historical Provider, MD  Rivaroxaban (XARELTO) 15 MG TABS tablet Take 15 mg by mouth 2 (two) times daily with a meal.   Yes Historical Provider, MD   No Known Allergies  FAMILY HISTORY:  No family history on file. SOCIAL HISTORY:  reports that he has been smoking.  He does not have any smokeless tobacco history on file. He reports that he does not drink alcohol or use illicit drugs.  REVIEW OF SYSTEMS:  Unable to obtain as pt is encephalopathic.  SUBJECTIVE:   VITAL SIGNS: Temp:  [98.8 F (37.1 C)-100.5 F (38.1 C)] 98.8 F (37.1 C) (09/23 1330) Pulse Rate:  [111-138] 130 (09/23 1524) Resp:  [18-45] 29 (09/23 1545) BP: (66-152)/(34-112) 92/48 mmHg (09/23 1545) SpO2:  [91 %-100 %] 100 % (09/23 1521) FiO2 (%):  [40 %-100 %] 100 % (09/23 1524) Weight:  [88.905 kg (196 lb)] 88.905 kg (196 lb) (09/23 1126) HEMODYNAMICS:   VENTILATOR SETTINGS: Vent Mode:  [-] PRVC FiO2 (%):  [40 %-100 %]  100 % Set Rate:  [12 bmp] 12 bmp Vt Set:  [350 mL] 630 mL PEEP:  [5 cmH20] 5 cmH20 Plateau Pressure:  [22 cmH20] 22 cmH20 INTAKE / OUTPUT: Intake/Output   None     PHYSICAL EXAMINATION: General: Disheveled male, in respiratory distress. Neuro: Mumbles in response to questions.  MAE's. HEENT: [place]/AT. PERRL, white/yellow discharge from both eyes. Cardiovascular: Tachy but regular, no M/R/G.  Lungs: Respirations shallow.  Scattered rhonchi R > L.  Abdomen: BS x 4, soft, NT/ND.  Musculoskeletal: No gross deformities, no edema. Skin: Intact, warm, no rashes.  Multiple excoriations  throughout.  LABS:  CBC  Recent Labs Lab 09/28/13 0530 09/17/2013 1135  WBC 9.6 31.0*  HGB 8.0* 9.4*  HCT 23.9* 27.4*  PLT 443* 386   Coag's  Recent Labs Lab 09/28/13 0530  APTT 61*   BMET  Recent Labs Lab 09/28/13 0530 09/16/2013 1135  NA 144 140  K 3.7 5.0  CL 114* 110  CO2 19 15*  BUN 24* 35*  CREATININE 1.93* 2.39*  GLUCOSE 62* 94   Electrolytes  Recent Labs Lab 09/28/13 0530 10/10/2013 1135  CALCIUM 7.0* 7.5*   Sepsis Markers  Recent Labs Lab 09/22/2013 1145  LATICACIDVEN 2.79*   ABG  Recent Labs Lab 10/05/2013 1444  PHART 7.201*  PCO2ART 41.6  PO2ART 67.0*   Liver Enzymes  Recent Labs Lab 09/28/13 0530 10/07/2013 1135  AST 143* 111*  ALT 156* 156*  ALKPHOS 236* 368*  BILITOT 0.9 1.4*  ALBUMIN 1.5* 1.8*   Cardiac Enzymes  Recent Labs Lab 10/07/2013 1135  PROBNP 924.9*   Glucose  Recent Labs Lab 09/27/13 1725 09/27/13 2138 09/28/13 0725 09/28/13 0754 09/28/13 1143 09/28/13 1613  GLUCAP 203* 91 60* 102* 129* 93    Imaging Dg Chest Portable 1 View  09/21/2013   CLINICAL DATA:  Central catheter placement  EXAM: PORTABLE CHEST - 1 VIEW  COMPARISON:  Study obtained earlier in the day  FINDINGS: Central catheter tip is in the superior vena cava. No pneumothorax. Consolidation in a portion of the right upper lobe is stable. Lungs elsewhere clear. Heart size and pulmonary vascularity are normal. No adenopathy. No bone lesions.  IMPRESSION: Right upper lobe pneumonia unchanged from earlier in the day. Central catheter tip in superior vena cava without pneumothorax. No new infiltrate seen.   Electronically Signed   By: Lowella Grip M.D.   On: 10/10/2013 13:18   Dg Chest Portable 1 View  10/01/2013   CLINICAL DATA:  Code sepsis; shortness of breath and fever; history of diabetes and acute renal injury  EXAM: PORTABLE CHEST - 1 VIEW  COMPARISON:  PA and lateral chest x-ray of September 25, 2013  FINDINGS: There is new confluent alveolar  density in the right upper lobe. Elsewhere the lungs exhibit minimal stable interstitial prominence. The heart and pulmonary vascularity are normal. The trachea is midline. There is no pleural effusion. The right internal jugular venous catheter has been removed. The observed bony structures are unremarkable.  IMPRESSION: New right upper lobe pneumonia. The examination is otherwise unchanged.   Electronically Signed   By: David  Martinique   On: 10/07/2013 12:25    ASSESSMENT / PLAN:  PULMONARY OETT 9/23 >>> A: Acute hypoxemic respiratory failure RUL HCAP P:   Full mechanical support, wean as able. VAP bundle. SBT in AM. Abx as per ID section. ABG and CXR in AM.  CARDIOVASCULAR CVL R IJ 9/23 placed by ED >>>9/23 (pulled out by patient) CVL  R Fem 9/23>> A Line R Fem 9/23 >>> A:  Septic Shock - secondary to RUL HCAP Hx HTN DVT  left posterior tibial vein - per LE dopplers 9/10. P:  Levophed gtt. Goal MAP > 65. Trend troponin / lactate. Hold outpatient Xarelto, switch to heparin gtt. Monitor BP, if persistently low, will consider stress dose steroids. Patient pulled out R IJ CVL line, R fem CVL line placed urgently.   RENAL A:   AG metabolic acidosis - expected gap ~5 after adjusting for albumin. AKI - up from previous admission P:   Difficulty with line access in the ED, did not get any fluid boluses initially. Give 2L NS bolus now, and readdress NS @ 125. BMP in AM.  GASTROINTESTINAL A:   Nutrition GI prophylaxis P:   NPO. Nutrition consult for TF's. SUP: Famotidine.  HEMATOLOGIC A:   VTE Prophylaxis DVT left posterior tibial vein - per LE dopplers 9/10. P:  SCD's / Heparin gtt. Was on on Angiomax infusion due to severe thrombocytopenia, placed on Xarelto at recent discharge. HIT panel -ve.  CBC in AM. Hold outpatient Xarelto.  INFECTIOUS A:   Septic Shock - secondary to HCAP P:   BCx2 9/23 UCx 9/23 Sputum Cx 9/23 Abx: Vanc, start date 9/23, day  1/x. Abx: Zosyn, start date 9/23, day 1/x. PCT algorithm to limit abx exposure.  ENDOCRINE A:   DM P:   ICU hyperglycemia protocol phase 1.  NEUROLOGIC A:   Acute metabolic encephalopathy P:   Sedation:  Fentanyl gtt. RASS goal: 0 to -1. Daily WUA.   TODAY'S SUMMARY: 68 y.o. M from nursing home, admitted with septic shock in setting of RUL HCAP.  Intubated by CCM while in ED.  On Levophed gtt.  Empiric abx, follow cultures.    Montey Hora, [place] Pgr: [phone]  or [phone] 09/22/2013, 4:00 PM  I have personally obtained a history, examined the patient, evaluated laboratory and imaging results, formulated the assessment and plan and placed orders. CRITICAL CARE: The patient is critically ill with multiple organ systems failure and requires high complexity decision making for assessment and support, frequent evaluation and titration of therapies, application of advanced monitoring technologies and extensive interpretation of multiple databases. Critical Care Time devoted to patient care services described in this note is 55 minutes.    Pulmonary and [place] Pager: [phone]  Patient seen and examined with PA R. Shearon Stalls.  Lab, images, and vitals reviewed. Agree with PA R. Desai assessment and plan with the following history per chart, from discharge summary on 9/17   68 year old African American male with history of anxiety, type 2 diabetes mellitus and hypertension who was admitted to this hospital on 09/15/2013 by pulmonary critical care after he presented to the ER with altered mental status at that time thought to be secondary to right lower extremity cellulitis and sepsis. He was also found to be in acute renal failure with a creatinine of 10, hyperkalemia and hypotension along with pancytopenia and thrombocytopenia. He was started on vancomycin and Zosyn for suspected cellulitis,  while in ICU there was suspicion of seizures and was seen by neurology, EEG and MRI were nonacute. He also underwent LP by pulmonary critical care which was not consistent with bacterial meningitis, ID consulted. Also in ICU he had left lower extremity DVT, due to thrombocytopenia he has been started on Angiomax infusion, transitioned to xarelto  on 09/25/13  He required intubation for one day between 9/8 and 09/20/2013, he continued to have fevers, he was placed on cooling blanket, had Foley catheter and a rectal tube and was transferred to the floor on 09/23/2013 to hospitalists day 8 of his hospital stay.  1. Toxic metabolic Encephalopathy. Questionable seizure activity while in ICU. Bacterial meningitis ruled out, seen by neurology and ID, CT scan, MRI brain and EEG nonacute. LP showed mild increase in lymphocytes however not very impressive. CSF Gram stain negative, HSV PCR is negative, RPR negative, ANA and ANCA negative, hepatitis and [facility] spotted fever serology negative, HIV non reactive, B12 stable, ammonia stable was slightly high on admission, encephalopathy seems to be improving. He is awake and alert and appropriate. Acyclovir stopped by ID on 09/23/2013. He is currently being monitored off all antibiotics and acyclovir.  Ehrlichia AB pending    Critical Care time 55 mins  Vilinda Boehringer, MD [place] [facility] Pager [phone] On Call Pager [phone]

## 2013-10-04 NOTE — ED Notes (Signed)
Pt presents from [facility] via [place] with c/o fever that began yesterday and was 101.20F, c/o SOB, lethargy and elevated WBC's.  Pt's HR is 120-130 and Sinus per EMS.  Pt is lethargic upon arrival but awakes to verbal stimuli.

## 2013-10-04 NOTE — ED Notes (Signed)
Patient remains oriented and is refusing BiPap. Dr. Tomi Bamberger at bedside to assess. RT contacted and updated. Pt placed on 4LNC

## 2013-10-04 NOTE — ED Notes (Signed)
Attempted Report 

## 2013-10-04 NOTE — Progress Notes (Signed)
ANTIBIOTIC CONSULT NOTE - INITIAL  Pharmacy Consult for Vancomycin, Cefepime Indication: Pneumonia  No Known Allergies  Patient Measurements: Height: 6' (182.9 cm) Weight: 196 lb (88.905 kg) IBW/kg (Calculated) : 77.6   Microbiology: Recent Results (from the past 720 hour(s))  CULTURE, BLOOD (ROUTINE X 2)     Status: None   Collection Time    09/15/13 10:15 AM      Result Value Ref Range Status   Specimen Description BLOOD LEFT HAND   Final   Special Requests BOTTLES DRAWN AEROBIC AND ANAEROBIC 10CC   Final   Culture  Setup Time     Final   Value: 09/15/2013 14:27     Performed at [facility]   Culture     Final   Value: NO GROWTH 5 DAYS     Performed at [facility]   Report Status 09/21/2013 FINAL   Final  CULTURE, BLOOD (ROUTINE X 2)     Status: None   Collection Time    09/15/13 10:30 AM      Result Value Ref Range Status   Specimen Description BLOOD LEFT HAND   Final   Special Requests BOTTLES DRAWN AEROBIC AND ANAEROBIC 10CC   Final   Culture  Setup Time     Final   Value: 09/15/2013 14:28     Performed at [facility]   Culture     Final   Value: NO GROWTH 5 DAYS     Performed at [facility]   Report Status 09/21/2013 FINAL   Final  MRSA PCR SCREENING     Status: None   Collection Time    09/15/13  4:45 PM      Result Value Ref Range Status   MRSA by PCR NEGATIVE  NEGATIVE Final   Comment:            The GeneXpert MRSA Assay (FDA     approved for NASAL specimens     only), is one component of a     comprehensive MRSA colonization     surveillance program. It is not     intended to diagnose MRSA     infection nor to guide or     monitor treatment for     MRSA infections.  CLOSTRIDIUM DIFFICILE BY PCR     Status: None   Collection Time    09/19/13  8:27 AM      Result Value Ref Range Status   C difficile by pcr NEGATIVE  NEGATIVE Final  STOOL CULTURE     Status: None   Collection Time    09/20/13  5:04 PM   Result Value Ref Range Status   Specimen Description STOOL   Final   Special Requests NONE   Final   Culture     Final   Value: NO SALMONELLA, SHIGELLA, CAMPYLOBACTER, YERSINIA, OR E.COLI 0157:H7 ISOLATED     Note: REDUCED NORMAL FLORA PRESENT     Performed at [facility]   Report Status 09/24/2013 FINAL   Final  CULTURE, EXPECTORATED SPUTUM-ASSESSMENT     Status: None   Collection Time    09/21/13 11:41 AM      Result Value Ref Range Status   Specimen Description SPUTUM   Final   Special Requests NONE   Final   Sputum evaluation     Final   Value: THIS SPECIMEN IS ACCEPTABLE. RESPIRATORY CULTURE REPORT TO FOLLOW.   Report Status 09/21/2013 FINAL   Final  CULTURE, RESPIRATORY (NON-EXPECTORATED)     Status: None   Collection Time    09/21/13 11:41 AM      Result Value Ref Range Status   Specimen Description SPUTUM   Final   Special Requests NONE   Final   Gram Stain     Final   Value: RARE WBC PRESENT,BOTH PMN AND MONONUCLEAR     RARE SQUAMOUS EPITHELIAL CELLS PRESENT     NO ORGANISMS SEEN     Performed at [facility]   Culture     Final   Value: FEW CANDIDA ALBICANS     Performed at [facility]   Report Status 09/23/2013 FINAL   Final  CULTURE, BLOOD (ROUTINE X 2)     Status: None   Collection Time    09/21/13  3:54 PM      Result Value Ref Range Status   Specimen Description BLOOD RIGHT ARM   Final   Special Requests BOTTLES DRAWN AEROBIC AND ANAEROBIC 10CC   Final   Culture  Setup Time     Final   Value: 09/21/2013 19:21     Performed at [facility]   Culture     Final   Value: NO GROWTH 5 DAYS     Performed at [facility]   Report Status 09/27/2013 FINAL   Final  CULTURE, BLOOD (ROUTINE X 2)     Status: None   Collection Time    09/21/13  4:05 PM      Result Value Ref Range Status   Specimen Description BLOOD RIGHT HAND   Final   Special Requests BOTTLES DRAWN AEROBIC ONLY 8CC   Final   Culture  Setup Time     Final    Value: 09/21/2013 19:18     Performed at [facility]   Culture     Final   Value: NO GROWTH 5 DAYS     Performed at [facility]   Report Status 09/27/2013 FINAL   Final  CSF CULTURE     Status: None   Collection Time    09/21/13  4:41 PM      Result Value Ref Range Status   Specimen Description CSF   Final   Special Requests 1.3ML CSF FLUID NO 2   Final   Gram Stain     Final   Value: WBC PRESENT, PREDOMINANTLY MONONUCLEAR     NO ORGANISMS SEEN     CYTOSPIN Performed at [facility]     Performed at [facility]   Culture     Final   Value: NO GROWTH 3 DAYS     Performed at [facility]   Report Status 09/24/2013 FINAL   Final  GRAM STAIN     Status: None   Collection Time    09/21/13  4:41 PM      Result Value Ref Range Status   Specimen Description CSF   Final   Special Requests 1.3ML CSF FLUID   Final   Gram Stain     Final   Value: WBC PRESENT, PREDOMINANTLY MONONUCLEAR     NO ORGANISMS SEEN     CYTOSPIN SLIDE   Report Status 09/21/2013 FINAL   Final  AFB CULTURE WITH SMEAR     Status: None   Collection Time    09/21/13  4:43 PM      Result Value Ref Range Status   Specimen Description CSF   Final  Special Requests 1.3ML CSF FLUID   Final   Acid Fast Smear     Final   Value: NO ACID FAST BACILLI SEEN     Performed at [facility]   Culture     Final   Value: CULTURE WILL BE EXAMINED FOR 6 WEEKS BEFORE ISSUING A FINAL REPORT     Performed at [facility]   Report Status PENDING   Incomplete  URINE CULTURE     Status: None   Collection Time    09/25/13 11:39 AM      Result Value Ref Range Status   Specimen Description URINE, CLEAN CATCH   Final   Special Requests NONE   Final   Culture  Setup Time     Final   Value: 09/25/2013 15:01     Performed at [facility]     Final   Value: 35,000 COLONIES/ML     Performed at [facility]   Culture     Final   Value:  STAPHYLOCOCCUS SPECIES (COAGULASE NEGATIVE)     Note: RIFAMPIN AND GENTAMICIN SHOULD NOT BE USED AS SINGLE DRUGS FOR TREATMENT OF STAPH INFECTIONS.     Performed at [facility]   Report Status 09/29/2013 FINAL   Final   Organism ID, Bacteria STAPHYLOCOCCUS SPECIES (COAGULASE NEGATIVE)   Final  CULTURE, BLOOD (ROUTINE X 2)     Status: None   Collection Time    09/25/13  1:50 PM      Result Value Ref Range Status   Specimen Description BLOOD LEFT HAND   Final   Special Requests BOTTLES DRAWN AEROBIC AND ANAEROBIC 10CC   Final   Culture  Setup Time     Final   Value: 09/25/2013 17:11     Performed at [facility]   Culture     Final   Value: NO GROWTH 5 DAYS     Performed at [facility]   Report Status 10/01/2013 FINAL   Final  CULTURE, BLOOD (ROUTINE X 2)     Status: None   Collection Time    09/25/13  1:55 PM      Result Value Ref Range Status   Specimen Description BLOOD LEFT ARM   Final   Special Requests     Final   Value: BOTTLES DRAWN AEROBIC AND ANAEROBIC 10CC BLUE, 5CC RED   Culture  Setup Time     Final   Value: 09/25/2013 17:11     Performed at [facility]   Culture     Final   Value: NO GROWTH 5 DAYS     Performed at [facility]   Report Status 10/01/2013 FINAL   Final    Medical History: Past Medical History  Diagnosis Date  . Hypertension   . Diabetes mellitus without complication   . Cancer     Assessment: 68 year old male to begin broad spectrum antibiotics for pneumonia - > admitted from [facility] with fever of 101.5, SOB, lethargy, and elevated WBCs  Received first doses in the ED at noon Recently admission earlier this month (9/4 to 9/17) for acute renal failure and AMS-->severe sepsis-->dc to [facility] place  Scr = 1.93  Also on Xarelto 15 mg po BID through 10/5 then 20 mg daily for DVT  Goal of Therapy:  Vancomycin trough level 15-20 mcg/ml  Plan:  1) Vancomycin 750 mg iv Q 12 hours starting at 2300  pm 2) Cefepime 1 gram iv Q 12 hours starting at 2300 pm 3) Follow up Xarelto continuation 4) Follow Scr, cultures, progress, fever trend  Thank you. Anette Guarneri, PharmD [phone]  William Stark 10/02/2013,11:45 AM

## 2013-10-04 NOTE — Progress Notes (Signed)
Pt placed on Bipap for SOB and inc WOB. PT took Bipap off and would not allow RN or RT to place it back on. PT intubated by CCM and placed on vent. ABG to follow. RT will continue to monitor.

## 2013-10-04 NOTE — ED Notes (Signed)
Dr.Knapp at bedside  

## 2013-10-04 NOTE — Procedures (Signed)
Procedure Note:  Orotracheal Intubation Implied consent due to emergent nature of patient's condition. Correct Patient, Name & ID confirmed.  The patient was pre-oxygenated and then, under direct visualization, a 7.5 mm cuffed endotracheal tube was placed through the vocal cords into the trachea. Total attempts made 1, using glidescope, moderate thick white secretions from trachea. During intubation an assistant applied gentle pressure to the cricoid cartilage.  Position confirmed by auscultation of lungs (good breath sounds bilaterally) and no stomach sounds. Tube secured at 23 cm. Pulse ox 100 %. CO2 detector in place with appropriate color change.  Pt tolerated procedure well.  No complications were noted.   Intubation Drugs Versed 2mg  Fentanyl 169mcg Etomidate 20mg    Vilinda Boehringer, MD [place] [facility] Pager [phone] On Call Pager [phone]

## 2013-10-04 NOTE — ED Provider Notes (Addendum)
CSN: [id number]     Arrival date & time 10/03/2013  1113 History   First MD Initiated Contact with Patient 10/05/2013 1125     Chief Complaint  Patient presents with  . Code Sepsis   HPI Patient presents to the emergency room with concerns of possible sepsis. The patient's been residing at [facility] and rehabilitation. Patient began having a fever yesterday up to 101.5. Patient admits that he has been having an increased cough and congestion. His mouth has felt dry and he has felt dehydrated. He denies any nausea or vomiting. He's not had any trouble with any chest pain or abdominal pain. He denies any dysuria.  This morning the staff noted that the patient was tachycardic. He also seemed somewhat are when he woke up this morning. They did some blood tests and showed that he had elevated white blood cells. He was sent to the emergency for further evaluation Past Medical History  Diagnosis Date  . Hypertension   . Diabetes mellitus without complication   . Cancer    Past Surgical History  Procedure Laterality Date  . Colon surgery    . Appendectomy    . Tonsillectomy    . Cholecystectomy     No family history on file. History  Substance Use Topics  . Smoking status: Current Every Day Smoker  . Smokeless tobacco: Not on file  . Alcohol Use: No    Review of Systems  All other systems reviewed and are negative.     Allergies  Review of patient's allergies indicates no known allergies.  Home Medications   Prior to Admission medications   Medication Sig Start Date End Date Taking? Authorizing Provider  acetaminophen (TYLENOL) 325 MG tablet Take 650 mg by mouth every 6 (six) hours as needed for mild pain.   Yes Historical Provider, MD  ALPRAZolam Duanne Moron) 0.5 MG tablet Take 0.5 mg by mouth 3 (three) times daily as needed for anxiety.   Yes Historical Provider, MD  insulin aspart (NOVOLOG) 100 UNIT/ML injection Inject 2-15 Units into the skin 3 (three) times daily before meals.  *121-150=2 units, 151-200=3 units, 201-250=5 units, 251-300=8 units, 301-350=11 units, 351-400=15 units, >400 call MD/NP*   Yes Historical Provider, MD  Rivaroxaban (XARELTO) 15 MG TABS tablet Take 15 mg by mouth 2 (two) times daily with a meal.   Yes Historical Provider, MD   BP 110/68  Pulse 123  Temp(Src) 100.5 F (38.1 C) (Rectal)  Resp 25  Ht 6' (1.829 m)  Wt 196 lb (88.905 kg)  BMI 26.58 kg/m2  SpO2 97% Physical Exam  Nursing note and vitals reviewed. Constitutional: He appears well-developed and well-nourished.  Chronically ill-appearing  HENT:  Head: Normocephalic and atraumatic.  Right Ear: External ear normal.  Left Ear: External ear normal.  Mucous membranes are dry  Eyes: Conjunctivae are normal. Right eye exhibits discharge. Left eye exhibits discharge. No scleral icterus.  Scant yellow discharge in the corners of his eyes  Neck: Neck supple. No tracheal deviation present.  Cardiovascular: Intact distal pulses.  A regularly irregular rhythm present. Tachycardia present.   Pulmonary/Chest: Effort normal. No stridor. No respiratory distress. He has no wheezes. He has rhonchi. He has no rales.  Abdominal: Soft. Bowel sounds are normal. He exhibits no distension. There is no tenderness. There is no rebound and no guarding.  Musculoskeletal: He exhibits no edema and no tenderness.  Neurological: He is alert. No cranial nerve deficit (no facial droop, extraocular movements intact, no slurred speech)  or sensory deficit. He exhibits normal muscle tone. He displays no seizure activity. Coordination normal.  General weakness   Skin: Skin is warm and dry. No rash noted. He is not diaphoretic.  Skin is dry, there is some breakdown in the antecubital fossa  Psychiatric: He has a normal mood and affect.    ED Course  CENTRAL LINE Date/Time: 10/03/2013 1:13 PM Performed by: Dorie Rank Authorized by: Dorie Rank Consent: Verbal consent obtained. Risks and benefits: risks,  benefits and alternatives were discussed Consent given by: patient Patient identity confirmed: verbally with patient Time out: Immediately prior to procedure a "time out" was called to verify the correct patient, procedure, equipment, support staff and site/side marked as required. Indications: vascular access Anesthesia: local infiltration Local anesthetic: lidocaine 1% without epinephrine Anesthetic total: 3 ml Patient sedated: no Preparation: skin prepped with 2% chlorhexidine Skin prep agent dried: skin prep agent completely dried prior to procedure Sterile barriers: all five maximum sterile barriers used - cap, mask, sterile gown, sterile gloves, and large sterile sheet Hand hygiene: hand hygiene performed prior to central venous catheter insertion Location details: right internal jugular Site selection rationale: ease of access Patient position: Trendelenburg Catheter type: triple lumen Catheter size: 7 Fr Ultrasound guidance: yes Number of attempts: 1 Successful placement: yes Post-procedure: dressing applied Assessment: blood return through all ports and free fluid flow Patient tolerance: Patient tolerated the procedure well with no immediate complications. Comments: X ray pending    (including critical care time) CRITICAL CARE Performed by: VQQVZ,DGL Total critical care time: 35 Critical care time was exclusive of separately billable procedures and treating other patients. Critical care was necessary to treat or prevent imminent or life-threatening deterioration. Critical care was time spent personally by me on the following activities: development of treatment plan with patient and/or surrogate as well as nursing, discussions with consultants, evaluation of patient's response to treatment, examination of patient, obtaining history from patient or surrogate, ordering and performing treatments and interventions, ordering and review of laboratory studies, ordering and review  of radiographic studies, pulse oximetry and re-evaluation of patient's condition.  Labs Review Labs Reviewed  CBC WITH DIFFERENTIAL - Abnormal; Notable for the following:    WBC 31.0 (*)    RBC 2.98 (*)    Hemoglobin 9.4 (*)    HCT 27.4 (*)    RDW 17.2 (*)    Neutrophils Relative % 82 (*)    Neutro Abs 25.8 (*)    Monocytes Absolute 1.2 (*)    All other components within normal limits  COMPREHENSIVE METABOLIC PANEL - Abnormal; Notable for the following:    CO2 15 (*)    BUN 35 (*)    Creatinine, Ser 2.39 (*)    Calcium 7.5 (*)    Albumin 1.8 (*)    AST 111 (*)    ALT 156 (*)    Alkaline Phosphatase 368 (*)    Total Bilirubin 1.4 (*)    GFR calc non Af Amer 26 (*)    GFR calc Af Amer 30 (*)    All other components within normal limits  PRO B NATRIURETIC PEPTIDE - Abnormal; Notable for the following:    Pro B Natriuretic peptide (BNP) 924.9 (*)    All other components within normal limits  I-STAT CG4 LACTIC ACID, ED - Abnormal; Notable for the following:    Lactic Acid, Venous 2.79 (*)    All other components within normal limits  CULTURE, BLOOD (ROUTINE X 2)  CULTURE, BLOOD (ROUTINE  X 2)  URINE CULTURE  URINALYSIS, ROUTINE W REFLEX MICROSCOPIC  I-STAT CG4 LACTIC ACID, ED  I-STAT TROPOININ, ED    Imaging Review Dg Chest Portable 1 View  09/18/2013   CLINICAL DATA:  Code sepsis; shortness of breath and fever; history of diabetes and acute renal injury  EXAM: PORTABLE CHEST - 1 VIEW  COMPARISON:  PA and lateral chest x-ray of September 25, 2013  FINDINGS: There is new confluent alveolar density in the right upper lobe. Elsewhere the lungs exhibit minimal stable interstitial prominence. The heart and pulmonary vascularity are normal. The trachea is midline. There is no pleural effusion. The right internal jugular venous catheter has been removed. The observed bony structures are unremarkable.  IMPRESSION: New right upper lobe pneumonia. The examination is otherwise unchanged.    Electronically Signed   By: David  Martinique   On: 10/03/2013 12:25     MDM  The patient's blood pressure dropped down into the 90s after his initial fluid bolus had been started. Nursing staff was unable to get more than one IV access. For this reason and the concern of sepsis, I placed a central line. The patient tolerated this well.  Broad-spectrum antibiotics were started to cover for healthcare associated pneumonia sepsis. The patient's creatinine function is worse than his previous value. Lactic gases level is also elevated. I suspect volume depletion is exaggerating factor.  Continue with antibiotics and IV hydration. Patient will be monitored closely. Plan on admission to the ICU. Final diagnoses:  HCAP (healthcare-associated pneumonia)    D/w PCCM.  Pt appears stable for stepdown.  I will consult hospitalist.   Dorie Rank, MD 09/21/2013 1328  1425 Pt is becoming more dyspneic.  It does not want to wear the bipap.  He tells me he prefers the oxygen.  I am concerned about his labored breathing.  He may require intubation.   Will speak with ICU again.  I think he will need to be in the unit.  ABG ordered  Dorie Rank, MD 10/08/2013 1427

## 2013-10-04 NOTE — Progress Notes (Signed)
Attempted ABG unsuccessful. Will get another RT to attempt again.

## 2013-10-04 NOTE — ED Notes (Signed)
MAP>65 and Lactic Acid <4, PT to remain Level 2 Code Sepsis at this time.

## 2013-10-04 NOTE — ED Notes (Signed)
Attempted report 

## 2013-10-05 ENCOUNTER — Inpatient Hospital Stay (HOSPITAL_COMMUNITY): Payer: Medicare Other

## 2013-10-05 DIAGNOSIS — J189 Pneumonia, unspecified organism: Secondary | ICD-10-CM

## 2013-10-05 DIAGNOSIS — E1129 Type 2 diabetes mellitus with other diabetic kidney complication: Secondary | ICD-10-CM | POA: Insufficient documentation

## 2013-10-05 DIAGNOSIS — R6521 Severe sepsis with septic shock: Secondary | ICD-10-CM

## 2013-10-05 DIAGNOSIS — I82409 Acute embolism and thrombosis of unspecified deep veins of unspecified lower extremity: Secondary | ICD-10-CM

## 2013-10-05 DIAGNOSIS — N179 Acute kidney failure, unspecified: Secondary | ICD-10-CM

## 2013-10-05 DIAGNOSIS — G9340 Encephalopathy, unspecified: Secondary | ICD-10-CM

## 2013-10-05 DIAGNOSIS — A419 Sepsis, unspecified organism: Secondary | ICD-10-CM

## 2013-10-05 LAB — CBC WITH DIFFERENTIAL/PLATELET
BASOS ABS: 0.4 10*3/uL — AB (ref 0.0–0.1)
Basophils Relative: 1 % (ref 0–1)
Eosinophils Absolute: 1.6 10*3/uL — ABNORMAL HIGH (ref 0.0–0.7)
Eosinophils Relative: 4 % (ref 0–5)
HCT: 23.9 % — ABNORMAL LOW (ref 39.0–52.0)
Hemoglobin: 8.1 g/dL — ABNORMAL LOW (ref 13.0–17.0)
LYMPHS PCT: 4 % — AB (ref 12–46)
Lymphs Abs: 1.6 10*3/uL (ref 0.7–4.0)
MCH: 30.5 pg (ref 26.0–34.0)
MCHC: 33.9 g/dL (ref 30.0–36.0)
MCV: 89.8 fL (ref 78.0–100.0)
MONOS PCT: 4 % (ref 3–12)
Monocytes Absolute: 1.6 10*3/uL — ABNORMAL HIGH (ref 0.1–1.0)
NEUTROS PCT: 87 % — AB (ref 43–77)
Neutro Abs: 33.9 10*3/uL — ABNORMAL HIGH (ref 1.7–7.7)
PLATELETS: 341 10*3/uL (ref 150–400)
RBC: 2.66 MIL/uL — ABNORMAL LOW (ref 4.22–5.81)
RDW: 17.8 % — AB (ref 11.5–15.5)
WBC Morphology: INCREASED
WBC: 39.1 10*3/uL — AB (ref 4.0–10.5)

## 2013-10-05 LAB — GLUCOSE, CAPILLARY
GLUCOSE-CAPILLARY: 102 mg/dL — AB (ref 70–99)
GLUCOSE-CAPILLARY: 134 mg/dL — AB (ref 70–99)
GLUCOSE-CAPILLARY: 211 mg/dL — AB (ref 70–99)
Glucose-Capillary: 177 mg/dL — ABNORMAL HIGH (ref 70–99)
Glucose-Capillary: 150 mg/dL — ABNORMAL HIGH (ref 70–99)
Glucose-Capillary: 238 mg/dL — ABNORMAL HIGH (ref 70–99)
Glucose-Capillary: 259 mg/dL — ABNORMAL HIGH (ref 70–99)

## 2013-10-05 LAB — CBC
HCT: 25.8 % — ABNORMAL LOW (ref 39.0–52.0)
HEMOGLOBIN: 8.5 g/dL — AB (ref 13.0–17.0)
MCH: 29.6 pg (ref 26.0–34.0)
MCHC: 32.9 g/dL (ref 30.0–36.0)
MCV: 89.9 fL (ref 78.0–100.0)
Platelets: 408 10*3/uL — ABNORMAL HIGH (ref 150–400)
RBC: 2.87 MIL/uL — AB (ref 4.22–5.81)
RDW: 17.6 % — ABNORMAL HIGH (ref 11.5–15.5)
WBC: 35.5 10*3/uL — AB (ref 4.0–10.5)

## 2013-10-05 LAB — BASIC METABOLIC PANEL
Anion gap: 11 (ref 5–15)
BUN: 46 mg/dL — ABNORMAL HIGH (ref 6–23)
CO2: 15 mEq/L — ABNORMAL LOW (ref 19–32)
Calcium: 6.5 mg/dL — ABNORMAL LOW (ref 8.4–10.5)
Chloride: 113 mEq/L — ABNORMAL HIGH (ref 96–112)
Creatinine, Ser: 3.49 mg/dL — ABNORMAL HIGH (ref 0.50–1.35)
GFR, EST AFRICAN AMERICAN: 19 mL/min — AB (ref >90)
GFR, EST NON AFRICAN AMERICAN: 17 mL/min — AB (ref >90)
Glucose, Bld: 169 mg/dL — ABNORMAL HIGH (ref 70–99)
Potassium: 5.6 mEq/L — ABNORMAL HIGH (ref 3.7–5.3)
SODIUM: 139 meq/L (ref 137–147)

## 2013-10-05 LAB — TROPONIN I
Troponin I: <0.3 ng/mL (ref <0.30)
Troponin I: <0.3 ng/mL (ref <0.30)

## 2013-10-05 LAB — MISCELLANEOUS TEST

## 2013-10-05 LAB — HEPARIN LEVEL (UNFRACTIONATED): Heparin Unfractionated: >2.2 IU/mL — ABNORMAL HIGH (ref 0.30–0.70)

## 2013-10-05 LAB — POCT I-STAT 3, ART BLOOD GAS (G3+)
ACID-BASE DEFICIT: 12 mmol/L — AB (ref 0.0–2.0)
Bicarbonate: 15.2 mEq/L — ABNORMAL LOW (ref 20.0–24.0)
O2 SAT: 96 %
TCO2: 16 mmol/L (ref 0–100)
pCO2 arterial: 44.6 mmHg (ref 35.0–45.0)
pH, Arterial: 7.152 — CL (ref 7.350–7.450)
pO2, Arterial: 113 mmHg — ABNORMAL HIGH (ref 80.0–100.0)

## 2013-10-05 LAB — APTT: aPTT: 53 seconds — ABNORMAL HIGH (ref 24–37)

## 2013-10-05 LAB — LACTIC ACID, PLASMA: LACTIC ACID, VENOUS: 2.6 mmol/L — AB (ref 0.5–2.2)

## 2013-10-05 LAB — PATHOLOGIST SMEAR REVIEW

## 2013-10-05 LAB — PROCALCITONIN: PROCALCITONIN: 46.12 ng/mL

## 2013-10-05 MED ORDER — HYDROCORTISONE NA SUCCINATE PF 100 MG IJ SOLR
50.0000 mg | Freq: Four times a day (QID) | INTRAMUSCULAR | Status: DC
  Administered 2013-10-05 – 2013-10-10 (×20): 50 mg via INTRAVENOUS
  Filled 2013-10-05 (×24): qty 1

## 2013-10-05 MED ORDER — SODIUM BICARBONATE 8.4 % IV SOLN
INTRAVENOUS | Status: AC
  Administered 2013-10-05: 100 meq
  Filled 2013-10-05: qty 100

## 2013-10-05 MED ORDER — INSULIN ASPART 100 UNIT/ML ~~LOC~~ SOLN
0.0000 [IU] | SUBCUTANEOUS | Status: DC
Start: 2013-10-05 — End: 2013-10-10
  Administered 2013-10-05: 5 [IU] via SUBCUTANEOUS
  Administered 2013-10-05: 8 [IU] via SUBCUTANEOUS
  Administered 2013-10-06 (×3): 2 [IU] via SUBCUTANEOUS
  Administered 2013-10-07 (×2): 3 [IU] via SUBCUTANEOUS
  Administered 2013-10-07: 2 [IU] via SUBCUTANEOUS
  Administered 2013-10-07 – 2013-10-08 (×4): 3 [IU] via SUBCUTANEOUS
  Administered 2013-10-08: 5 [IU] via SUBCUTANEOUS
  Administered 2013-10-08: 8 [IU] via SUBCUTANEOUS
  Administered 2013-10-09: 5 [IU] via SUBCUTANEOUS
  Administered 2013-10-09 (×2): 3 [IU] via SUBCUTANEOUS
  Administered 2013-10-09 (×2): 2 [IU] via SUBCUTANEOUS
  Administered 2013-10-09: 5 [IU] via SUBCUTANEOUS
  Administered 2013-10-10 (×4): 3 [IU] via SUBCUTANEOUS

## 2013-10-05 MED ORDER — METHYLPREDNISOLONE SODIUM SUCC 125 MG IJ SOLR
60.0000 mg | Freq: Four times a day (QID) | INTRAMUSCULAR | Status: DC
  Administered 2013-10-05: 60 mg via INTRAVENOUS
  Filled 2013-10-05 (×5): qty 0.96

## 2013-10-05 MED ORDER — FAMOTIDINE IN NACL 20-0.9 MG/50ML-% IV SOLN
20.0000 mg | INTRAVENOUS | Status: DC
  Administered 2013-10-06 – 2013-10-08 (×3): 20 mg via INTRAVENOUS
  Filled 2013-10-05 (×4): qty 50

## 2013-10-05 MED ORDER — SODIUM CHLORIDE 0.9 % IV SOLN
250.0000 mg | Freq: Four times a day (QID) | INTRAVENOUS | Status: DC
  Administered 2013-10-05 – 2013-10-06 (×4): 250 mg via INTRAVENOUS
  Filled 2013-10-05 (×7): qty 250

## 2013-10-05 MED ORDER — FLUCONAZOLE IN SODIUM CHLORIDE 200-0.9 MG/100ML-% IV SOLN
200.0000 mg | INTRAVENOUS | Status: DC
  Administered 2013-10-05: 200 mg via INTRAVENOUS
  Filled 2013-10-05 (×2): qty 100

## 2013-10-05 MED ORDER — PHENYLEPHRINE HCL 10 MG/ML IJ SOLN
30.0000 ug/min | INTRAVENOUS | Status: DC
  Administered 2013-10-05: 90 ug/min via INTRAVENOUS
  Administered 2013-10-05: 100 ug/min via INTRAVENOUS
  Administered 2013-10-05 – 2013-10-06 (×2): 30 ug/min via INTRAVENOUS
  Administered 2013-10-06 (×2): 90 ug/min via INTRAVENOUS
  Filled 2013-10-05 (×9): qty 2

## 2013-10-05 MED ORDER — ACETAMINOPHEN 650 MG RE SUPP: 650.0000 mg | Freq: Four times a day (QID) | RECTAL | Status: DC | PRN

## 2013-10-05 MED ORDER — VASOPRESSIN 20 UNIT/ML IJ SOLN
0.0300 [IU]/min | INTRAVENOUS | Status: DC
  Administered 2013-10-05 – 2013-10-06 (×4): 0.03 [IU]/min via INTRAVENOUS
  Filled 2013-10-05 (×3): qty 2

## 2013-10-05 MED ORDER — SODIUM BICARBONATE 8.4 % IV SOLN
100.0000 meq | Freq: Once | INTRAVENOUS | Status: AC
  Filled 2013-10-05: qty 100

## 2013-10-05 MED ORDER — SODIUM CHLORIDE 0.9 % IV BOLUS (SEPSIS)
1000.0000 mL | Freq: Once | INTRAVENOUS | Status: AC
  Administered 2013-10-05: 1000 mL via INTRAVENOUS

## 2013-10-05 MED ORDER — WHITE PETROLATUM GEL
Status: AC
  Administered 2013-10-05: 0.2
  Filled 2013-10-05: qty 5

## 2013-10-05 NOTE — Progress Notes (Signed)
Utilization review completed. Kamori Kitchens, RN, BSN. 

## 2013-10-05 NOTE — Progress Notes (Signed)
ANTIBIOTIC CONSULT NOTE - INITIAL  Pharmacy Consult for primaxin + fluconazole Indication: rule out sepsis  No Known Allergies  Patient Measurements: Height: 6' (182.9 cm) Weight: 206 lb 12.7 oz (93.8 kg) IBW/kg (Calculated) : 77.6 Adjusted Body Weight:   Vital Signs: Temp: 100.4 F (38 C) (09/24 1300) Temp src: Core (Comment) (09/24 0800) BP: 68/43 mmHg (09/24 0915) Pulse Rate: 96 (09/24 1300) Intake/Output from previous day: 09/23 0701 - 09/24 0700 In: 6034.6 [I.V.:5581.7; NG/GT:165.3; IV Piggyback:287.5] Out: 240 [Urine:240] Intake/Output from this shift: Total I/O In: 2466.3 [I.V.:1826.3; NG/GT:240; IV Piggyback:400] Out: 5 [Urine:5]  Labs:  Recent Labs  09/21/2013 1135 09/15/2013 1900 10/05/13 0400 10/05/13 0830  WBC 31.0* 35.4* 35.5* 39.1*  HGB 9.4* 8.9* 8.5* 8.1*  PLT 386 433* 408* 341  CREATININE 2.39* 2.73*  --  3.49*   Estimated Creatinine Clearance: 24.1 ml/min (by C-G formula based on Cr of 3.49). No results found for this basename: VANCOTROUGH, Corlis Leak, VANCORANDOM, GENTTROUGH, GENTPEAK, GENTRANDOM, TOBRATROUGH, TOBRAPEAK, TOBRARND, AMIKACINPEAK, AMIKACINTROU, AMIKACIN,  in the last 72 hours   Microbiology: Recent Results (from the past 720 hour(s))  CULTURE, BLOOD (ROUTINE X 2)     Status: None   Collection Time    09/15/13 10:15 AM      Result Value Ref Range Status   Specimen Description BLOOD LEFT HAND   Final   Special Requests BOTTLES DRAWN AEROBIC AND ANAEROBIC 10CC   Final   Culture  Setup Time     Final   Value: 09/15/2013 14:27     Performed at [facility]   Culture     Final   Value: NO GROWTH 5 DAYS     Performed at [facility]   Report Status 09/21/2013 FINAL   Final  CULTURE, BLOOD (ROUTINE X 2)     Status: None   Collection Time    09/15/13 10:30 AM      Result Value Ref Range Status   Specimen Description BLOOD LEFT HAND   Final   Special Requests BOTTLES DRAWN AEROBIC AND ANAEROBIC 10CC   Final   Culture   Setup Time     Final   Value: 09/15/2013 14:28     Performed at [facility]   Culture     Final   Value: NO GROWTH 5 DAYS     Performed at [facility]   Report Status 09/21/2013 FINAL   Final  MRSA PCR SCREENING     Status: None   Collection Time    09/15/13  4:45 PM      Result Value Ref Range Status   MRSA by PCR NEGATIVE  NEGATIVE Final   Comment:            The GeneXpert MRSA Assay (FDA     approved for NASAL specimens     only), is one component of a     comprehensive MRSA colonization     surveillance program. It is not     intended to diagnose MRSA     infection nor to guide or     monitor treatment for     MRSA infections.  CLOSTRIDIUM DIFFICILE BY PCR     Status: None   Collection Time    09/19/13  8:27 AM      Result Value Ref Range Status   C difficile by pcr NEGATIVE  NEGATIVE Final  STOOL CULTURE     Status: None   Collection Time    09/20/13  5:04 PM      Result Value Ref Range Status   Specimen Description STOOL   Final   Special Requests NONE   Final   Culture     Final   Value: NO SALMONELLA, SHIGELLA, CAMPYLOBACTER, YERSINIA, OR E.COLI 0157:H7 ISOLATED     Note: REDUCED NORMAL FLORA PRESENT     Performed at [facility]   Report Status 09/24/2013 FINAL   Final  CULTURE, EXPECTORATED SPUTUM-ASSESSMENT     Status: None   Collection Time    09/21/13 11:41 AM      Result Value Ref Range Status   Specimen Description SPUTUM   Final   Special Requests NONE   Final   Sputum evaluation     Final   Value: THIS SPECIMEN IS ACCEPTABLE. RESPIRATORY CULTURE REPORT TO FOLLOW.   Report Status 09/21/2013 FINAL   Final  CULTURE, RESPIRATORY (NON-EXPECTORATED)     Status: None   Collection Time    09/21/13 11:41 AM      Result Value Ref Range Status   Specimen Description SPUTUM   Final   Special Requests NONE   Final   Gram Stain     Final   Value: RARE WBC PRESENT,BOTH PMN AND MONONUCLEAR     RARE SQUAMOUS EPITHELIAL CELLS PRESENT      NO ORGANISMS SEEN     Performed at [facility]   Culture     Final   Value: FEW CANDIDA ALBICANS     Performed at [facility]   Report Status 09/23/2013 FINAL   Final  CULTURE, BLOOD (ROUTINE X 2)     Status: None   Collection Time    09/21/13  3:54 PM      Result Value Ref Range Status   Specimen Description BLOOD RIGHT ARM   Final   Special Requests BOTTLES DRAWN AEROBIC AND ANAEROBIC 10CC   Final   Culture  Setup Time     Final   Value: 09/21/2013 19:21     Performed at [facility]   Culture     Final   Value: NO GROWTH 5 DAYS     Performed at [facility]   Report Status 09/27/2013 FINAL   Final  CULTURE, BLOOD (ROUTINE X 2)     Status: None   Collection Time    09/21/13  4:05 PM      Result Value Ref Range Status   Specimen Description BLOOD RIGHT HAND   Final   Special Requests BOTTLES DRAWN AEROBIC ONLY 8CC   Final   Culture  Setup Time     Final   Value: 09/21/2013 19:18     Performed at [facility]   Culture     Final   Value: NO GROWTH 5 DAYS     Performed at [facility]   Report Status 09/27/2013 FINAL   Final  CSF CULTURE     Status: None   Collection Time    09/21/13  4:41 PM      Result Value Ref Range Status   Specimen Description CSF   Final   Special Requests 1.3ML CSF FLUID NO 2   Final   Gram Stain     Final   Value: WBC PRESENT, PREDOMINANTLY MONONUCLEAR     NO ORGANISMS SEEN     CYTOSPIN Performed at [facility]     Performed at [place]     Final  Value: NO GROWTH 3 DAYS     Performed at [facility]   Report Status 09/24/2013 FINAL   Final  GRAM STAIN     Status: None   Collection Time    09/21/13  4:41 PM      Result Value Ref Range Status   Specimen Description CSF   Final   Special Requests 1.3ML CSF FLUID   Final   Gram Stain     Final   Value: WBC PRESENT, PREDOMINANTLY MONONUCLEAR     NO ORGANISMS SEEN     CYTOSPIN SLIDE   Report Status  09/21/2013 FINAL   Final  AFB CULTURE WITH SMEAR     Status: None   Collection Time    09/21/13  4:43 PM      Result Value Ref Range Status   Specimen Description CSF   Final   Special Requests 1.3ML CSF FLUID   Final   Acid Fast Smear     Final   Value: NO ACID FAST BACILLI SEEN     Performed at [facility]   Culture     Final   Value: CULTURE WILL BE EXAMINED FOR 6 WEEKS BEFORE ISSUING A FINAL REPORT     Performed at [facility]   Report Status PENDING   Incomplete  URINE CULTURE     Status: None   Collection Time    09/25/13 11:39 AM      Result Value Ref Range Status   Specimen Description URINE, CLEAN CATCH   Final   Special Requests NONE   Final   Culture  Setup Time     Final   Value: 09/25/2013 15:01     Performed at [place]     Final   Value: 35,000 COLONIES/ML     Performed at [facility]   Culture     Final   Value: STAPHYLOCOCCUS SPECIES (COAGULASE NEGATIVE)     Note: RIFAMPIN AND GENTAMICIN SHOULD NOT BE USED AS SINGLE DRUGS FOR TREATMENT OF STAPH INFECTIONS.     Performed at [facility]   Report Status 09/29/2013 FINAL   Final   Organism ID, Bacteria STAPHYLOCOCCUS SPECIES (COAGULASE NEGATIVE)   Final  CULTURE, BLOOD (ROUTINE X 2)     Status: None   Collection Time    09/25/13  1:50 PM      Result Value Ref Range Status   Specimen Description BLOOD LEFT HAND   Final   Special Requests BOTTLES DRAWN AEROBIC AND ANAEROBIC 10CC   Final   Culture  Setup Time     Final   Value: 09/25/2013 17:11     Performed at [facility]   Culture     Final   Value: NO GROWTH 5 DAYS     Performed at [facility]   Report Status 10/01/2013 FINAL   Final  CULTURE, BLOOD (ROUTINE X 2)     Status: None   Collection Time    09/25/13  1:55 PM      Result Value Ref Range Status   Specimen Description BLOOD LEFT ARM   Final   Special Requests     Final   Value: BOTTLES DRAWN AEROBIC AND ANAEROBIC  10CC BLUE, 5CC RED   Culture  Setup Time     Final   Value: 09/25/2013 17:11     Performed at [place]     Final  Value: NO GROWTH 5 DAYS     Performed at [facility]   Report Status 10/01/2013 FINAL   Final  CULTURE, BLOOD (ROUTINE X 2)     Status: None   Collection Time    09/15/2013 12:00 PM      Result Value Ref Range Status   Specimen Description BLOOD RIGHT ANTECUBITAL   Final   Special Requests BOTTLES DRAWN AEROBIC AND ANAEROBIC 6MLS   Final   Culture  Setup Time     Final   Value: 09/25/2013 22:36     Performed at [facility]   Culture     Final   Value:        BLOOD CULTURE RECEIVED NO GROWTH TO DATE CULTURE WILL BE HELD FOR 5 DAYS BEFORE ISSUING A FINAL NEGATIVE REPORT     Performed at [facility]   Report Status PENDING   Incomplete  CULTURE, BLOOD (ROUTINE X 2)     Status: None   Collection Time    09/17/2013  1:20 PM      Result Value Ref Range Status   Specimen Description BLOOD PICC LINE   Final   Special Requests BOTTLES DRAWN AEROBIC AND ANAEROBIC 6CC RIGHT IJ   Final   Culture  Setup Time     Final   Value: 09/13/2013 23:53     Performed at [facility]   Culture     Final   Value:        BLOOD CULTURE RECEIVED NO GROWTH TO DATE CULTURE WILL BE HELD FOR 5 DAYS BEFORE ISSUING A FINAL NEGATIVE REPORT     Performed at [facility]   Report Status PENDING   Incomplete  CULTURE, RESPIRATORY (NON-EXPECTORATED)     Status: None   Collection Time    09/13/2013  3:25 PM      Result Value Ref Range Status   Specimen Description ENDOTRACHEAL   Final   Special Requests NONE   Final   Gram Stain     Final   Value: ABUNDANT WBC PRESENT,BOTH PMN AND MONONUCLEAR     NO SQUAMOUS EPITHELIAL CELLS SEEN     ABUNDANT GRAM POSITIVE COCCI     IN PAIRS IN CLUSTERS     Performed at [facility]   Culture     Final   Value: Culture reincubated for better growth     Performed at [facility]   Report  Status PENDING   Incomplete  MRSA PCR SCREENING     Status: Abnormal   Collection Time    09/19/2013  5:05 PM      Result Value Ref Range Status   MRSA by PCR POSITIVE (*) NEGATIVE Final   Comment:            The GeneXpert MRSA Assay (FDA     approved for NASAL specimens     only), is one component of a     comprehensive MRSA colonization     surveillance program. It is not     intended to diagnose MRSA     infection nor to guide or     monitor treatment for     MRSA infections.     RESULT CALLED TO, READ BACK BY AND VERIFIED WITH:     C.MCKEOWN,RN AT [facility] 09/23/2013    Medical History: Past Medical History  Diagnosis Date  . Hypertension   . Diabetes mellitus without complication   . Cancer  Medications:  Anti-infectives   Start     Dose/Rate Route Frequency Ordered Stop   10/05/13 1200  fluconazole (DIFLUCAN) IVPB 200 mg     200 mg 100 mL/hr over 60 Minutes Intravenous Every 24 hours 10/05/13 1048     10/05/13 0900  imipenem-cilastatin (PRIMAXIN) 250 mg in sodium chloride 0.9 % 100 mL IVPB     250 mg 200 mL/hr over 30 Minutes Intravenous Every 6 hours 10/05/13 0813     09/21/2013 2300  vancomycin (VANCOCIN) IVPB 750 mg/150 ml premix  Status:  Discontinued     750 mg 150 mL/hr over 60 Minutes Intravenous Every 12 hours 09/30/2013 1155 10/05/13 1312   10/10/2013 2300  vancomycin (VANCOCIN) IVPB 750 mg/150 ml premix  Status:  Discontinued     750 mg 150 mL/hr over 60 Minutes Intravenous Every 12 hours 09/26/2013 1155 10/10/2013 1157   09/30/2013 2300  ceFEPIme (MAXIPIME) 1 g in dextrose 5 % 50 mL IVPB  Status:  Discontinued     1 g 100 mL/hr over 30 Minutes Intravenous Every 12 hours 10/06/2013 1157 09/21/2013 1610   09/19/2013 2200  piperacillin-tazobactam (ZOSYN) IVPB 3.375 g  Status:  Discontinued     3.375 g 12.5 mL/hr over 240 Minutes Intravenous 3 times per day 10/07/2013 1611 10/05/13 0811   10/09/2013 1145  ceFEPIme (MAXIPIME) 2 g in dextrose 5 % 50 mL IVPB     2 g 100  mL/hr over 30 Minutes Intravenous  Once 09/26/2013 1142 09/21/2013 1447   10/10/2013 1145  vancomycin (VANCOCIN) IVPB 1000 mg/200 mL premix     1,000 mg 200 mL/hr over 60 Minutes Intravenous  Once 10/10/2013 1142 09/20/2013 1430     Assessment: 28 yom presented to the hospital with fevers from a nursing home. He had recently been admitted with ARF and sepsis. Pt with Tmax 102.8 and significantly elevated of WBC of 102.8. PCT is elevated at 46.12 and LA 2.6. Scr is rapidly increasing. Cultures are pending. Adjusting antibiotics to vancomycin + primaxin + fluconazole today.   Vanc 9/23>> Zosyn 9/23>>9/24 Primaxin 9/24>> Fluconazole 9/24>>  Goal of Therapy:  Vancomycin trough level 15-20 mcg/ml  Plan:  1. Primaxin 250mg  IV Q6H 2. Fluconazole 200mg  IV Q24H 3. Hold vancomycin for now d/t rapidly increasing Scr - f/u Scr trend for restart 4. F/u renal fxn, C&S, clinical status and trough at [facility], Rande Lawman 10/05/2013,1:12 PM

## 2013-10-05 NOTE — Progress Notes (Signed)
Inpatient Diabetes Program Recommendations  AACE/ADA: New Consensus Statement on Inpatient Glycemic Control (2013)  Target Ranges:  Prepandial:   less than 140 mg/dL      Peak postprandial:   less than 180 mg/dL (1-2 hours)      Critically ill patients:  140 - 180 mg/dL   Results for William Stark, William Stark (MRN [id number]) as of 10/05/2013 12:21  Ref. Range 10/03/2013 16:51 09/20/2013 19:16 10/05/2013 00:43 10/05/2013 03:56 10/05/2013 07:56  Glucose-Capillary Latest Range: 70-99 mg/dL 102 (H) 103 (H) 134 (H) 177 (H) 150 (H)   Diabetes history: DM 2 Outpatient Diabetes medications: Novolog 2-15 units TID with meals Current orders for Inpatient glycemic control: Has order for ICU Glycemic Control order set (phase 1) but not any Novolog correction orders per protocol  Inpatient Diabetes Program Recommendations Correction (SSI): Noted an order for ICU Glycemic Control order set but there are not any Novolog correction orders. Noted patient was started on steroids which are likely to elevate glucose further. Please order Novolog correction scale per ICU Glycemic Control order set.  Thanks, Barnie Alderman, RN, MSN, CCRN Diabetes Coordinator Inpatient Diabetes Program [phone] (Team Pager) [phone] ([place] office) [phone] [facility] office)

## 2013-10-05 NOTE — Progress Notes (Signed)
HISTORY & PHYSICAL  DATE: 10/03/2013   FACILITY: [facility]  LEVEL OF CARE: SNF (31)  ALLERGIES:  No Known Allergies  CHIEF COMPLAINT:  Manage diabetes mellitus, left lower extremity DVT and chronic kidney disease stage III  HISTORY OF PRESENT ILLNESS: 68 year old African American male was hospitalized secondary to toxic metabolic encephalopathy and severe sepsis. After hospitalization he then admitted to this facility for short-term rehabilitation. He is a poor historian.  DM:pt's DM remains stable.  Pt denies polyuria, polydipsia, polyphagia, changes in vision or hypoglycemic episodes.  No complications noted from the medication presently being used.  Last hemoglobin A1c is: 6.4.  CHRONIC KIDNEY DISEASE: The patient's chronic kidney disease remains stable.  Patient denies increasing lower extremity swelling or confusion. Last BUN and creatinine are: 46, 3.49.  DVT: The DVTs remains stable.  Patient denies calf pain, chest pain or shortness of breath.  Patient is currently on anti-coagulation and does not report any complications from that.  PAST MEDICAL HISTORY :  Past Medical History  Diagnosis Date  . Hypertension   . Diabetes mellitus without complication   . Cancer     PAST SURGICAL HISTORY: Past Surgical History  Procedure Laterality Date  . Colon surgery    . Appendectomy    . Tonsillectomy    . Cholecystectomy      SOCIAL HISTORY:  reports that he has been smoking.  He does not have any smokeless tobacco history on file. He reports that he does not drink alcohol or use illicit drugs.  FAMILY HISTORY: None per record  CURRENT MEDICATIONS: Reviewed per MAR/see medication list  REVIEW OF SYSTEMS: Difficult to obtain-patient is a poor historian  PHYSICAL EXAMINATION  VS:  See VS section  GENERAL: no acute distress, normal body habitus EYES: conjunctivae normal, sclerae normal, normal eye lids MOUTH/THROAT: lips without  lesions,no lesions in the mouth,tongue is without lesions,uvula elevates in midline NECK: supple, trachea midline, no neck masses, no thyroid tenderness, no thyromegaly LYMPHATICS: no LAN in the neck, no supraclavicular LAN RESPIRATORY: breathing is even & unlabored, BS CTAB CARDIAC: RRR, no murmur,no extra heart sounds, right lower extremity +3 edema and left lower extremity +2 edema GI:  ABDOMEN: abdomen soft, normal BS, no masses, no tenderness  LIVER/SPLEEN: no hepatomegaly, no splenomegaly MUSCULOSKELETAL: HEAD: normal to inspection  EXTREMITIES: LEFT UPPER EXTREMITY: full range of motion, normal strength & tone RIGHT UPPER EXTREMITY:  full range of motion, normal strength & tone LEFT LOWER EXTREMITY:  full range of motion, normal strength & tone RIGHT LOWER EXTREMITY:  full range of motion, normal strength & tone PSYCHIATRIC: the patient is alert & oriented to person, affect & behavior appropriate  LABS/RADIOLOGY:  Labs reviewed: Basic Metabolic Panel:  Recent Labs  09/15/13 1211 09/15/13 1342  09/27/2013 1135 09/19/2013 1900 10/05/13 0830  NA 137  --   < > 140 141 139  K 6.5*  --   < > 5.0 4.7 5.6*  CL 109  --   < > 110 113* 113*  CO2 <7*  --   < > 15* 15* 15*  GLUCOSE 157*  --   < > 94 141* 169*  BUN 136*  --   < > 35* 38* 46*  CREATININE 9.78*  --   < > 2.39* 2.73* 3.49*  CALCIUM 6.2*  --   < > 7.5* 6.9* 6.5*  MG  --  2.5  --   --  1.8  --  PHOS  --  5.8*  --   --  6.7*  --   < > = values in this interval not displayed. Liver Function Tests:  Recent Labs  09/28/13 0530 09/28/2013 1135 09/19/2013 1900  AST 143* 111* 118*  ALT 156* 156* 147*  ALKPHOS 236* 368* 414*  BILITOT 0.9 1.4* 1.5*  PROT 5.0* 6.3 5.9*  ALBUMIN 1.5* 1.8* 1.6*    Recent Labs  09/19/13 2300  LIPASE 202*    Recent Labs  09/15/13 0830 09/19/13 1520  AMMONIA 69* 53   CBC:  Recent Labs  09/12/2013 1135 10/09/2013 1900 10/05/13 0400 10/05/13 0830  WBC 31.0* 35.4* 35.5* 39.1*    NEUTROABS 25.8* 30.7*  --  33.9*  HGB 9.4* 8.9* 8.5* 8.1*  HCT 27.4* 27.2* 25.8* 23.9*  MCV 91.9 92.2 89.9 89.8  PLT 386 433* 408* 341   Cardiac Enzymes:  Recent Labs  09/15/13 1754 09/15/13 1755  10/06/2013 1900 10/05/13 0200 10/05/13 0800  CKTOTAL  --  94  --   --   --   --   TROPONINI <0.30  --   < > <0.30 <0.30 <0.30  < > = values in this interval not displayed.  CBG:  Recent Labs  10/05/13 0756 10/05/13 1237 10/05/13 1554  GLUCAP 150* 211* 238*    Bilateral Lower Extremity Venous Duplex Evaluation  Patient:    William Stark, [facility] MR #:       [id number] Study Date: 09/21/2013 Gender:     M Age:        5 Height: Weight: BSA: Pt. Status: Room:       2M07C   Tana Felts    Toni Arthurs    [place], [place]  [place], [place]  SONOGRAPHER  Maudry Mayhew, RVT, RDCS, RDMS  Reports also to:  ------------------------------------------------------------------- History and indications:  Indications  729.81 Swelling of limb.  History  Diagnostic evaluation.  Swelling of both lower extremities.  Edema of both lower extremities.  ------------------------------------------------------------------- Study information:  Study status:  Routine.  Procedure:  A vascular evaluation was performed with the patient in the supine position. The right common femoral, right femoral, right profunda femoral, right popliteal, right peroneal, right posterior tibial, left common femoral, left femoral, left profunda femoral, left popliteal, left peroneal, and left posterior tibial veins were studied.    Bilateral lower extremity venous duplex evaluation.     Doppler flow study including B-mode compression maneuvers of all visualized segments, color flow Doppler and selected views of pulsed wave Doppler. Birthdate:  Patient birthdate: 07-18-1945.  Age:  Patient is 68 yr old.  Sex:  Gender: male.   Study date:  Study date: 09/21/2013. Study time: 03:19 PM.  Location:  Vascular laboratory.  Patient status:  Inpatient.  Venous flow:  +----------------+----------+----------------------+--------------+ Location        Overall   Flow properties       Thrombus       +----------------+----------+----------------------+--------------+ Right common    Patent    Phasic; spontaneous;  -------------- femoral                   compressible                         +----------------+----------+----------------------+--------------+ Right femoral   Patent    Compressible          -------------- +----------------+----------+----------------------+--------------+ Right  profunda  Patent    Compressible          -------------- femoral                                                        +----------------+----------+----------------------+--------------+ Right popliteal Patent    Phasic; spontaneous;  --------------                           compressible                         +----------------+----------+----------------------+--------------+ Right posterior Patent    Compressible          -------------- tibial                                                         +----------------+----------+----------------------+--------------+ Right peroneal  Patent    Compressible          -------------- +----------------+----------+----------------------+--------------+ Right           Patent    Compressible          -------------- saphenofemoral                                                 junction                                                       +----------------+----------+----------------------+--------------+ Left common     Patent    Phasic; spontaneous;  -------------- femoral                   compressible                         +----------------+----------+----------------------+--------------+ Left femoral     Patent    Compressible          -------------- +----------------+----------+----------------------+--------------+ Left profunda   Patent    Compressible          -------------- femoral                                                        +----------------+----------+----------------------+--------------+ Left popliteal  Patent    Phasic; spontaneous;  --------------                           compressible                         +----------------+----------+----------------------+--------------+ Left posterior  ThrombosedNoncompressible       Acute thrombus tibial                                                         +----------------+----------+----------------------+--------------+  Left peroneal   Patent    Compressible          -------------- +----------------+----------+----------------------+--------------+ Left            Patent    Compressible          -------------- saphenofemoral                                                 junction                                                       +----------------+----------+----------------------+--------------+  ------------------------------------------------------------------- Summary:  - Findings consistent with acute deep vein thrombosis involving the   left posterial tibial vein. - No evidence of deep vein thrombosis involving the right lower   extremity. - No evidence of Baker&'s cyst on the right or left.  CT HEAD WITHOUT CONTRAST   TECHNIQUE: Contiguous axial images were obtained from the base of the skull through the vertex without intravenous contrast.   COMPARISON:  None.   FINDINGS: Motion artifact. No intra-axial or extra-axial pathologic fold blood collection. No mass lesion. No hydrocephalus. No hemorrhage. Orbits are unremarkable. Mucosal thickening noted in frontal and ethmoid sinuses consistent with mild sinusitis. No significant  fluid collections in the mastoids. No acute bony abnormality.   IMPRESSION: 1. No acute intracranial abnormality. 2. Mild mucosal thickening noted in the frontal and ethmoidal sinuses consistent with mild sinusitis. Sinusitis may be chronic.   MRI HEAD WITHOUT AND WITH CONTRAST   TECHNIQUE: Multiplanar, multiecho pulse sequences of the brain and surrounding structures were obtained without and with intravenous contrast.   CONTRAST:  79mL MULTIHANCE GADOBENATE DIMEGLUMINE 529 MG/ML IV SOLN   COMPARISON:  Head CT 09/19/2013   FINDINGS: Dedicated thin section imaging through the temporal lobes demonstrates normal volume and signal of the hippocampi. There is no evidence of heterotopia.   There is no evidence of acute infarct, intracranial hemorrhage, mass, midline shift, or extra-axial fluid collection. Minimal periventricular white matter T2 hyperintensity is nonspecific and less than is often seen in patients of this age. There is mild generalized cerebral atrophy. No abnormal enhancement is identified, although postcontrast images are moderately degraded by motion.   Orbits are unremarkable. There is moderate bilateral ethmoid air cell mucosal thickening, with mild mucosal thickening in the remainder of the paranasal sinuses. Small bilateral mastoid effusions are present. Major intracranial vascular flow voids are preserved.   IMPRESSION: 1. No acute intracranial abnormality identified. 2. Mild cerebral atrophy. 3. Moderate paranasal sinus inflammatory mucosal disease and small bilateral mastoid effusions.     PORTABLE CHEST - 1 VIEW   COMPARISON:  October 04, 2013   FINDINGS: Endotracheal tube tip is 3.8 cm above the carina. Nasogastric tube tip and side port are below the diaphragm. There has been interval removal of right jugular catheter. No pneumothorax. There is persistent airspace consolidation in the right upper lobe. Elsewhere lungs are clear. Heart size  and pulmonary vascularity are normal. No adenopathy.   IMPRESSION: Persistent right upper lobe airspace consolidation. No new opacity. Tube positions as described without pneumothorax. Interval removal of right jugular catheter.   US ABDOMEN LIMITED - RIGHT UPPER QUADRANT  COMPARISON:  None.   FINDINGS: Gallbladder:   Surgically absent   Common bile duct:   Diameter: 6 mm   Liver:   No focal lesion identified. Within normal limits in parenchymal echogenicity.   IMPRESSION: Status post cholecystectomy. Common bile duct within normal limits for post cholecystectomy state.    ASSESSMENT/PLAN:  Diabetes mellitus with renal complications-well-controlled Left lower extremity DVT-continue xarelto Chronic kidney disease stage III-recheck renal functions elevated liver functions-recheck Anemia of chronic kidney disease-recheck Renovascular hypertension-blood pressure low. Monitor. Off of hydrochlorothiazide. Hyperkalemia-recheck Hyperphosphatemia-recheck Hypocalcemia-recheck Check CBC with differential and CMP  I have reviewed patient's medical records received at admission/from hospitalization.  CPT CODE: 27062  Milda Lindvall Y Maresa Morash, [place] [phone]

## 2013-10-05 NOTE — Progress Notes (Signed)
eLink Physician-Brief Progress Note Patient Name: William Stark DOB: 06/07/1945 MRN: [id number]   Date of Service  10/05/2013  HPI/Events of Note  Continued progressive decline in BP despite max NE gtt with current BP of 86/39 (53) with the addition of vasopressin.  eICU Interventions  Plan: Start NEO for BP support Stress steroids Check CVP     Intervention Category Major Interventions: Shock - evaluation and management  DETERDING,ELIZABETH 10/05/2013, 5:43 AM

## 2013-10-05 NOTE — Plan of Care (Signed)
Problem: Phase I Progression Outcomes Goal: Code status addressed with pt/family Outcome: Progressing Pt made LCB- drugs and ventilator only Goal: Hemodynamically stable Outcome: Progressing Pt on levo, neo, and vasopressin Goal: Voiding-avoid urinary catheter unless indicated Outcome: Not Progressing Pt has a urinary catheter

## 2013-10-05 NOTE — Progress Notes (Signed)
INITIAL NUTRITION ASSESSMENT  DOCUMENTATION CODES Per approved criteria  -Not Applicable   INTERVENTION: Continue current TF orders. Consider D/C TF if plans for comfort care.  NUTRITION DIAGNOSIS: Inadequate oral intake related to inability to eat as evidenced by NPO status.   Goal: Intake to meet >90% of estimated nutrition needs.  Monitor:  TF tolerance/adequacy, weight trend, labs, vent status, overall goals of care.  Reason for Assessment: MD Consult for TF initiation and management.  68 y.o. male  Admitting Dx: Fever  ASSESSMENT: 68 y.o. M, nursing home resident [facility]) brought to [facility] ED on 9/23 with fever x 1 day to 101.5. In ED, developed respiratory failure, failed BiPAP and required intubation.   Per discussion in ICU rounds today, patient with a poor prognosis, may not survive the day. Code status changed to limited code blue with no plans for escalation of care, adding pressors, or hemodialysis. Per CCM physician, will not adjust TF orders due to poor prognosis.   Height: Ht Readings from Last 1 Encounters:  10/07/2013 6' (1.829 m)    Weight: Wt Readings from Last 1 Encounters:  10/05/13 206 lb 12.7 oz (93.8 kg)    Ideal Body Weight: 80.9 kg  % Ideal Body Weight: 116%  Wt Readings from Last 10 Encounters:  10/05/13 206 lb 12.7 oz (93.8 kg)  09/29/13 196 lb 13.9 oz (89.3 kg)  09/27/13 188 lb 15 oz (85.7 kg)  08/15/13 210 lb (95.255 kg)    Usual Body Weight: 188 lb  % Usual Body Weight: 110%  BMI:  Body mass index is 28.04 kg/(m^2).  Diet Order: NPO   Intake/Output Summary (Last 24 hours) at 10/05/13 1451 Last data filed at 10/05/13 1400  Gross per 24 hour  Intake 8674.82 ml  Output    270 ml  Net 8404.82 ml    Labs:   Recent Labs Lab 09/22/2013 1135 09/12/2013 1900 10/05/13 0830  NA 140 141 139  K 5.0 4.7 5.6*  CL 110 113* 113*  CO2 15* 15* 15*  BUN 35* 38* 46*  CREATININE 2.39* 2.73* 3.49*  CALCIUM 7.5* 6.9* 6.5*  MG  --   1.8  --   PHOS  --  6.7*  --   GLUCOSE 94 141* 169*    CBG (last 3)   Recent Labs  10/05/13 0356 10/05/13 0756 10/05/13 1237  GLUCAP 177* 150* 211*    Scheduled Meds: . antiseptic oral rinse  7 mL Mouth Rinse QID  . chlorhexidine  15 mL Mouth Rinse BID  . Chlorhexidine Gluconate Cloth  6 each Topical Q0600  . [START ON 10/06/2013] famotidine (PEPCID) IV  20 mg Intravenous Q24H  . feeding supplement (VITAL HIGH PROTEIN)  1,000 mL Per Tube Q24H  . fentaNYL  50 mcg Intravenous Once  . fluconazole (DIFLUCAN) IV  200 mg Intravenous Q24H  . hydrocortisone sodium succinate  50 mg Intravenous 4 times per day  . imipenem-cilastatin  250 mg Intravenous Q6H  . mupirocin ointment  1 application Nasal BID    Continuous Infusions: . sodium chloride 1,000 mL (10/05/13 0800)  . fentaNYL infusion INTRAVENOUS 100 mcg/hr (10/05/13 1300)  . norepinephrine (LEVOPHED) Adult infusion 40 mcg/min (10/05/13 1300)  . phenylephrine (NEO-SYNEPHRINE) Adult infusion 100 mcg/min (10/05/13 1300)  . vasopressin (PITRESSIN) infusion - *FOR SHOCK* 0.03 Units/min (10/05/13 1300)    Past Medical History  Diagnosis Date  . Hypertension   . Diabetes mellitus without complication   . Cancer     Past Surgical  History  Procedure Laterality Date  . Colon surgery    . Appendectomy    . Tonsillectomy    . Cholecystectomy      Molli Barrows, RD, LDN, [place] Pager [phone] After Hours Pager [phone]

## 2013-10-05 NOTE — Consult Note (Addendum)
WOC wound consult note Reason for Consult: Consult requested for buttocks and groin.  Pt has dry flaking skin all over body; appearance consistent with possible psoriasis. Wound type: Buttocks moist and macerated in gluteal cleft; partial thickness fissure 6X.1X.1cm.  Appearance consistent with moisture associated skin damage.  Scattered patchy areas of partial thickness skin loss across bilat buttocks.  All areas are red and moist. Inner groin with same appearance.   Pressure Ulcer POA: These are NOT pressure ulcers Dressing procedure/placement/frequency: Leave open to air as much as possible.  Pt is on a Sport low-airloss bed which reduces pressure and increases airflow.  Barrier cream to repel moisture and promote healing. Please re-consult if further assistance is needed.  Thank-you,  Julien Girt MSN, [place], [place], [place], [place]

## 2013-10-05 NOTE — Progress Notes (Signed)
ANTICOAGULATION CONSULT NOTE - Follow Up Consult  Pharmacy Consult for Heparin Indication: DVT  No Known Allergies  Patient Measurements: Height: 6' (182.9 cm) Weight: 195 lb 8.8 oz (88.7 kg) IBW/kg (Calculated) : 77.6  Vital Signs: BP: 87/49 mmHg (09/24 0230) Pulse Rate: 127 (09/24 0230)  Labs:  Recent Labs  10/05/2013 1135 09/27/2013 1900 10/05/13 0200  HGB 9.4* 8.9*  --   HCT 27.4* 27.2*  --   PLT 386 433*  --   APTT  --   --  53*  LABPROT  --  53.8*  --   INR  --  6.04*  --   HEPARINUNFRC  --   --  >2.20*  CREATININE 2.39* 2.73*  --   TROPONINI  --  <0.30 <0.30    Estimated Creatinine Clearance: 28.4 ml/min (by C-G formula based on Cr of 2.73).  Assessment: 68 yo male admitted with VDRF/sepsis, recent h/o DVT, Xarelto on hold, for heparin  Goal of Therapy:  aPTT 66-102 seconds Monitor platelets by anticoagulation protocol: Yes   Plan:  Increase Heparin 1550 units/hr Check aPTTl in 8 hours.   Caryl Pina 10/05/2013,3:30 AM

## 2013-10-05 NOTE — Progress Notes (Signed)
PULMONARY / CRITICAL CARE MEDICINE   Name: William Stark MRN: [id number] DOB: [birth year]    ADMISSION DATE:  09/12/2013 CONSULTATION DATE:  10/05/2013  REFERRING MD :  EDP  CHIEF COMPLAINT:  Fever  INITIAL PRESENTATION: 68 y.o. M, nursing home resident [facility]) brought to [facility] ED on 9/23 with fever x 1 day to 101.5.  In ED, developed respiratory failure, failed BiPAP and required intubation.  PCCM was consulted for admission.  STUDIES:  CXR 9/23 >>> RUL PNA  SIGNIFICANT EVENTS: 9/23 - intubated while in ED (by CCM), admitted to ICU.   SUBJECTIVE: worsening shock, fever, more pressors, WBC still up  VITAL SIGNS: Temp:  [98.8 F (37.1 C)-102.8 F (39.3 C)] 102.8 F (39.3 C) (09/24 0430) Pulse Rate:  [32-138] 116 (09/24 0730) Resp:  [14-45] 21 (09/24 0730) BP: (66-152)/(31-112) 67/46 mmHg (09/24 0730) SpO2:  [85 %-100 %] 100 % (09/24 0730) Arterial Line BP: (63-126)/(39-71) 87/40 mmHg (09/24 0730) FiO2 (%):  [40 %-100 %] 40 % (09/24 0732) Weight:  [88.7 kg (195 lb 8.8 oz)-93.8 kg (206 lb 12.7 oz)] 93.8 kg (206 lb 12.7 oz) (09/24 0423) HEMODYNAMICS:   VENTILATOR SETTINGS: Vent Mode:  [-] PRVC FiO2 (%):  [40 %-100 %] 40 % Set Rate:  [12 bmp] 12 bmp Vt Set:  [630 mL] 630 mL PEEP:  [5 cmH20] 5 cmH20 Plateau Pressure:  [2 cmH20-22 cmH20] 2 cmH20 INTAKE / OUTPUT: Intake/Output     09/23 0701 - 09/24 0700 09/24 0701 - 09/25 0700   I.V. (mL/kg) 5581.7 (59.5)    NG/GT 165.3    IV Piggyback 287.5    Total Intake(mL/kg) 6034.6 (64.3)    Urine (mL/kg/hr) 240    Total Output 240     Net +5794.6            PHYSICAL EXAMINATION:  Gen: sedated on vent HEENT: NCAT, EOMi PULM: crackles upper lobes, otherwise clear CV: Tachy, regular AB: BS+, soft, no guarding or rebound Ext: warm, massive edema all over Neuro: sedated heavily on vent  LABS:  CBC  Recent Labs Lab 09/13/2013 1135 10/07/2013 1900 10/05/13 0400  WBC 31.0* 35.4* 35.5*  HGB 9.4* 8.9* 8.5*  HCT  27.4* 27.2* 25.8*  PLT 386 433* 408*   Coag's  Recent Labs Lab 09/23/2013 1900 10/05/13 0200  APTT  --  53*  INR 6.04*  --    BMET  Recent Labs Lab 10/02/2013 1135 10/10/2013 1900  NA 140 141  K 5.0 4.7  CL 110 113*  CO2 15* 15*  BUN 35* 38*  CREATININE 2.39* 2.73*  GLUCOSE 94 141*   Electrolytes  Recent Labs Lab 09/18/2013 1135 10/06/2013 1900  CALCIUM 7.5* 6.9*  MG  --  1.8  PHOS  --  6.7*   Sepsis Markers  Recent Labs Lab 10/02/2013 1145 09/23/2013 1900 10/05/13 0400  LATICACIDVEN 2.79* 3.2*  --   PROCALCITON  --  26.82 46.12   ABG  Recent Labs Lab 10/01/2013 1444 09/16/2013 1629 10/05/13 0743  PHART 7.201* 7.199* 7.152*  PCO2ART 41.6 43.4 44.6  PO2ART 67.0* 417.0* 113.0*   Liver Enzymes  Recent Labs Lab 09/25/2013 1135 10/03/2013 1900  AST 111* 118*  ALT 156* 147*  ALKPHOS 368* 414*  BILITOT 1.4* 1.5*  ALBUMIN 1.8* 1.6*   Cardiac Enzymes  Recent Labs Lab 10/03/2013 1135 09/30/2013 1900 10/05/13 0200  TROPONINI  --  <0.30 <0.30  PROBNP 924.9*  --   --    Glucose  Recent Labs Lab 09/28/13  1143 09/28/13 1613 10/09/2013 1651 09/26/2013 1916 10/05/13 0043 10/05/13 0356  GLUCAP 129* 93 102* 103* 134* 177*    Imaging Portable Chest Xray In Am  10/05/2013   CLINICAL DATA:  Respiratory failure  EXAM: PORTABLE CHEST - 1 VIEW  COMPARISON:  October 04, 2013  FINDINGS: Endotracheal tube tip is 3.8 cm above the carina. Nasogastric tube tip and side port are below the diaphragm. There has been interval removal of right jugular catheter. No pneumothorax. There is persistent airspace consolidation in the right upper lobe. Elsewhere lungs are clear. Heart size and pulmonary vascularity are normal. No adenopathy.  IMPRESSION: Persistent right upper lobe airspace consolidation. No new opacity. Tube positions as described without pneumothorax. Interval removal of right jugular catheter.   Electronically Signed   By: Bretta Bang M.D.   On: 10/05/2013 07:09   Dg  Chest Portable 1 View  09/28/2013   CLINICAL DATA:  Shortness of breath, code sepsis  EXAM: PORTABLE CHEST - 1 VIEW  COMPARISON:  Portable chest x-ray of earlier today at 13:10 hrs.  FINDINGS: The patient has undergone interval intubation of the trachea and esophagus. The endotracheal tube tip lies 3.4 cm above the crotch of the carina. The tip and proximal port of the esophagogastric tube project below the inferior margin of the image. The tip of the pre-existing right internal jugular venous catheter overlies the proximal SVC.  The confluent alveolar density in the right upper lobe persists. Elsewhere the lung parenchyma is clear. The heart and pulmonary vascularity within the limits of normal.  IMPRESSION: 1. There is persistent alveolar density in the right upper lobe consistent with pneumonia. 2. Positioning of the endotracheal tube is good approximately 3.4 cm above the crotch of the carina.   Electronically Signed   By: David  [place]   On: 09/16/2013 16:38   Dg Chest Portable 1 View  09/12/2013   CLINICAL DATA:  Central catheter placement  EXAM: PORTABLE CHEST - 1 VIEW  COMPARISON:  Study obtained earlier in the day  FINDINGS: Central catheter tip is in the superior vena cava. No pneumothorax. Consolidation in a portion of the right upper lobe is stable. Lungs elsewhere clear. Heart size and pulmonary vascularity are normal. No adenopathy. No bone lesions.  IMPRESSION: Right upper lobe pneumonia unchanged from earlier in the day. Central catheter tip in superior vena cava without pneumothorax. No new infiltrate seen.   Electronically Signed   By: Bretta Bang M.D.   On: 09/30/2013 13:18   Dg Chest Portable 1 View  09/28/2013   CLINICAL DATA:  Code sepsis; shortness of breath and fever; history of diabetes and acute renal injury  EXAM: PORTABLE CHEST - 1 VIEW  COMPARISON:  PA and lateral chest x-ray of September 25, 2013  FINDINGS: There is new confluent alveolar density in the right upper lobe.  Elsewhere the lungs exhibit minimal stable interstitial prominence. The heart and pulmonary vascularity are normal. The trachea is midline. There is no pleural effusion. The right internal jugular venous catheter has been removed. The observed bony structures are unremarkable.  IMPRESSION: New right upper lobe pneumonia. The examination is otherwise unchanged.   Electronically Signed   By: David  [place]   On: 09/14/2013 12:25    ASSESSMENT / PLAN:  PULMONARY OETT 9/23 >>> A: Acute hypoxemic respiratory failure RUL HCAP P:   Full mechanical support, wean as able. VAP bundle. SBT in AM. Abx as per ID section. ABG and CXR in AM.  CARDIOVASCULAR CVL  R IJ 9/23 placed by ED >>>9/23 (pulled out by patient) CVL R Fem 9/23>> A Line R Fem 9/23 >>> A:  Septic Shock - refractory to pressors Hx HTN DVT  left posterior tibial vein - per LE dopplers 9/10. P:  Levophed gtt, neo gtt, vasopression > titrated to MAP > 65 echo heparin gtt for DVT Add hydrocortisone for stress dose steroids See discussion below  RENAL A:   AKI - with worsening acidosis P:   KVO fluids BMP in AM.  GASTROINTESTINAL A:   Nutrition GI prophylaxis Elevated alk phos/transaminasis, history of cholestectomy P:   NPO. Nutrition consult for TF's. SUP: Famotidine. RUQ ultrasound for biliary duct  HEMATOLOGIC A:   VTE Prophylaxis DVT left posterior tibial vein - per LE dopplers 9/10. P:  Heparin gtt. .  CBC in AM. Hold outpatient Xarelto.  INFECTIOUS A:   Septic Shock -worsening;  secondary to HCAP? Not impressed by CXR but no other obvious source P:   BCx2 9/23 UCx 9/23 Sputum Cx 9/23 Abx: Vanc, start date 9/23, day 2/x. Stop Zosyn Start Imipenem 9/24 >> Start diflucan 9/24 >>  Look for other cause of sepsis with RUQ ultrasound given LFT, but would not survive surgery if he had an intra-abdominal process  ENDOCRINE A:   DM P:   ICU hyperglycemia protocol phase 1.  NEUROLOGIC A:    Acute metabolic encephalopathy P:   Sedation:  Fentanyl gtt. RASS goal: 0 to -1. Daily WUA.   TODAY'S SUMMARY: 68 y/o male admitted from nursing home with refractory septic shock and AKI.  Source uncertain.  Has HCAP but CXR unimpressive.  Picture worrisome for intra-abdominal process but too unstable to travel for CT or to have an intervention.  Discussed situation at length with family today.  He has had a progressive decline in mental status and functional ability in the last year.  They note that the last 4 months have been particularly bad.  I explained the severe nature of his illness and the fact that he would need hemodialysis if we were to move forward. His wife and nephew agree that he would not want this degree of aggressive care.  At this point we will continue with our current antibiotics and pressors but not add more.  Will not perform hemodialysis, no new pressors.  Code status changed to limited code blue, don't escalate or add pressors.  Explained to family he may very well die today.  Son to visit later today.  I have personally obtained a history, examined the patient, evaluated laboratory and imaging results, formulated the assessment and plan and placed orders. CRITICAL CARE: The patient is critically ill with multiple organ systems failure and requires high complexity decision making for assessment and support, frequent evaluation and titration of therapies, application of advanced monitoring technologies and extensive interpretation of multiple databases. Critical Care Time devoted to patient care services described in this note is 60 minutes.    Roselie Awkward, MD [place] [facility] Pager: [phone] Cell: [phone] If no response, call [phone]

## 2013-10-06 ENCOUNTER — Inpatient Hospital Stay (HOSPITAL_COMMUNITY): Payer: Medicare Other

## 2013-10-06 DIAGNOSIS — J1520 Pneumonia due to staphylococcus, unspecified: Secondary | ICD-10-CM

## 2013-10-06 DIAGNOSIS — N179 Acute kidney failure, unspecified: Secondary | ICD-10-CM

## 2013-10-06 LAB — BLOOD GAS, ARTERIAL
ACID-BASE DEFICIT: 12.2 mmol/L — AB (ref 0.0–2.0)
Bicarbonate: 15.1 mEq/L — ABNORMAL LOW (ref 20.0–24.0)
DRAWN BY: 33100
FIO2: 0.4 %
LHR: 12 {breaths}/min
O2 SAT: 97.1 %
PATIENT TEMPERATURE: 96.8
PEEP/CPAP: 5 cmH2O
PO2 ART: 122 mmHg — AB (ref 80.0–100.0)
TCO2: 16.5 mmol/L (ref 0–100)
VT: 630 mL
pCO2 arterial: 43.8 mmHg (ref 35.0–45.0)
pH, Arterial: 7.156 — CL (ref 7.350–7.450)

## 2013-10-06 LAB — BASIC METABOLIC PANEL
Anion gap: 15 (ref 5–15)
Anion gap: 14 (ref 5–15)
BUN: 59 mg/dL — ABNORMAL HIGH (ref 6–23)
BUN: 58 mg/dL — ABNORMAL HIGH (ref 6–23)
CALCIUM: 6.2 mg/dL — AB (ref 8.4–10.5)
CHLORIDE: 109 meq/L (ref 96–112)
CO2: 15 mEq/L — ABNORMAL LOW (ref 19–32)
CO2: 14 meq/L — AB (ref 19–32)
CREATININE: 3.96 mg/dL — AB (ref 0.50–1.35)
Calcium: 5.8 mg/dL — CL (ref 8.4–10.5)
Chloride: 110 mEq/L (ref 96–112)
Creatinine, Ser: 4.19 mg/dL — ABNORMAL HIGH (ref 0.50–1.35)
GFR calc Af Amer: 17 mL/min — ABNORMAL LOW (ref >90)
GFR calc non Af Amer: 14 mL/min — ABNORMAL LOW (ref >90)
GFR, EST AFRICAN AMERICAN: 15 mL/min — AB (ref >90)
GFR, EST NON AFRICAN AMERICAN: 13 mL/min — AB (ref >90)
GLUCOSE: 155 mg/dL — AB (ref 70–99)
Glucose, Bld: 117 mg/dL — ABNORMAL HIGH (ref 70–99)
POTASSIUM: 5 meq/L (ref 3.7–5.3)
Potassium: 5.5 mEq/L — ABNORMAL HIGH (ref 3.7–5.3)
Sodium: 140 mEq/L (ref 137–147)
Sodium: 137 mEq/L (ref 137–147)

## 2013-10-06 LAB — CBC WITH DIFFERENTIAL/PLATELET
BLASTS: 0 %
Band Neutrophils: 29 % — ABNORMAL HIGH (ref 0–10)
Basophils Absolute: 0 10*3/uL (ref 0.0–0.1)
Basophils Relative: 0 % (ref 0–1)
EOS PCT: 2 % (ref 0–5)
Eosinophils Absolute: 0.9 10*3/uL — ABNORMAL HIGH (ref 0.0–0.7)
HCT: 23.9 % — ABNORMAL LOW (ref 39.0–52.0)
Hemoglobin: 7.9 g/dL — ABNORMAL LOW (ref 13.0–17.0)
Lymphocytes Relative: 2 % — ABNORMAL LOW (ref 12–46)
Lymphs Abs: 0.9 10*3/uL (ref 0.7–4.0)
MCH: 30.4 pg (ref 26.0–34.0)
MCHC: 33.1 g/dL (ref 30.0–36.0)
MCV: 91.9 fL (ref 78.0–100.0)
MONOS PCT: 1 % — AB (ref 3–12)
MYELOCYTES: 3 %
Metamyelocytes Relative: 1 %
Monocytes Absolute: 0.4 10*3/uL (ref 0.1–1.0)
NEUTROS ABS: 41.6 10*3/uL — AB (ref 1.7–7.7)
NEUTROS PCT: 62 % (ref 43–77)
NRBC: 0 /100{WBCs}
PLATELETS: 296 10*3/uL (ref 150–400)
Promyelocytes Absolute: 0 %
RBC: 2.6 MIL/uL — ABNORMAL LOW (ref 4.22–5.81)
RDW: 18 % — AB (ref 11.5–15.5)
WBC: 43.8 10*3/uL — AB (ref 4.0–10.5)

## 2013-10-06 LAB — VANCOMYCIN, TROUGH: Vancomycin Tr: 20.7 ug/mL — ABNORMAL HIGH (ref 10.0–20.0)

## 2013-10-06 LAB — URINE CULTURE
COLONY COUNT: NO GROWTH
Culture: NO GROWTH

## 2013-10-06 LAB — GLUCOSE, CAPILLARY
GLUCOSE-CAPILLARY: 124 mg/dL — AB (ref 70–99)
GLUCOSE-CAPILLARY: 83 mg/dL (ref 70–99)
GLUCOSE-CAPILLARY: 121 mg/dL — AB (ref 70–99)
GLUCOSE-CAPILLARY: 110 mg/dL — AB (ref 70–99)
Glucose-Capillary: 130 mg/dL — ABNORMAL HIGH (ref 70–99)
Glucose-Capillary: 109 mg/dL — ABNORMAL HIGH (ref 70–99)

## 2013-10-06 LAB — PROTIME-INR
INR: 2.25 — ABNORMAL HIGH (ref 0.00–1.49)
Prothrombin Time: 24.9 seconds — ABNORMAL HIGH (ref 11.6–15.2)

## 2013-10-06 LAB — PROCALCITONIN: Procalcitonin: 39.52 ng/mL

## 2013-10-06 MED ORDER — SODIUM CHLORIDE 0.9 % IV SOLN
1.0000 g | Freq: Once | INTRAVENOUS | Status: AC
  Administered 2013-10-06: 1 g via INTRAVENOUS
  Filled 2013-10-06: qty 10

## 2013-10-06 MED ORDER — SODIUM CHLORIDE 0.9 % IV BOLUS (SEPSIS)
500.0000 mL | Freq: Once | INTRAVENOUS | Status: AC
  Administered 2013-10-06: 500 mL via INTRAVENOUS

## 2013-10-06 MED ORDER — INFLUENZA VAC SPLIT QUAD 0.5 ML IM SUSY
0.5000 mL | PREFILLED_SYRINGE | INTRAMUSCULAR | Status: AC
  Administered 2013-10-07: 0.5 mL via INTRAMUSCULAR
  Filled 2013-10-06: qty 0.5

## 2013-10-06 MED ORDER — SODIUM BICARBONATE 8.4 % IV SOLN
INTRAVENOUS | Status: DC
  Administered 2013-10-06 – 2013-10-09 (×5): via INTRAVENOUS
  Filled 2013-10-06 (×8): qty 150

## 2013-10-06 NOTE — Progress Notes (Signed)
eLink Physician-Brief Progress Note Patient Name: William Stark DOB: Jul 21, 1945 MRN: [id number]   Date of Service  10/06/2013  HPI/Events of Note  D/w wife She wishes to NOT  Escalate further vent or pressors, full no code blue  eICU Interventions  D/w sarah RN , RT  And order placed     Intervention Category Major Interventions: End of life / care limitation discussion  Raylene Miyamoto. 10/06/2013, 5:46 PM

## 2013-10-06 NOTE — Progress Notes (Signed)
[facility] Progress Note Patient Name: William Stark DOB: 1945-09-07 MRN: [id number]   Date of Service  10/06/2013  HPI/Events of Note  Hypocalcemia  eICU Interventions  Calcium replaced     Intervention Category Intermediate Interventions: Electrolyte abnormality - evaluation and management  DETERDING,ELIZABETH 10/06/2013, 6:40 AM

## 2013-10-06 NOTE — Progress Notes (Signed)
eLink Physician-Brief Progress Note Patient Name: William Stark DOB: [birth year] MRN: [id number]   Date of Service  10/06/2013  HPI/Events of Note  Staph in blood   eICU Interventions  Ensure vanc still on board, I didn't see it on MAR Will need repeat bc likley , tee? etc     Intervention Category Major Interventions: Infection - evaluation and management  Raylene Miyamoto. 10/06/2013, 8:12 PM

## 2013-10-06 NOTE — Significant Event (Signed)
CRITICAL VALUE ALERT  Critical value received:  Calcium 5.8  Date of notification:  10/06/2013  Time of notification:  1430  Critical value read back:Yes.    Nurse who received alert:  Lesli Albee  MD notified (1st page):  Dr Lake Bells  Time of first page:  1437  Responding MD:  Dr Lake Bells  Time MD responded:  1440

## 2013-10-06 NOTE — Progress Notes (Signed)
CRITICAL VALUE ALERT  Critical value received:  Calcium 6.2  Date of notification:  10/06/13  Time of notification:  0600  Critical value read back:yes  Nurse who received alert:  Rito Ehrlich, RN  MD notified (1st page):  Dr. Catha Gosselin  Time of first page:  0600  MD notified (2nd page):  Time of second page:  Responding MD:  Dr. Jimmy Footman  Time MD responded:  [phone]

## 2013-10-06 NOTE — Progress Notes (Signed)
PULMONARY / CRITICAL CARE MEDICINE   Name: William Stark MRN: [id number] DOB: 1945/11/01    ADMISSION DATE:  09/20/2013 CONSULTATION DATE:  10/06/2013  REFERRING MD :  EDP  CHIEF COMPLAINT:  Fever  INITIAL PRESENTATION: 68 y.o. M, nursing home resident [facility]) brought to [facility] ED on 9/23 with fever x 1 day to 101.5.  In ED, developed respiratory failure, failed BiPAP and required intubation.  PCCM was consulted for admission.  STUDIES:  CXR 9/23 >>> RUL PNA Echo 9/24 > LVEF 55-60%, wall motion abnormalities RUQ U/S 9/24 > normal CBD, no gallbladder  SIGNIFICANT EVENTS: 9/23 - intubated while in ED (by CCM), admitted to ICU. 9/23 - family wants limited code, no HD  SUBJECTIVE: Staph in sputum, GPC in blood, minimal UOP, hyper k,   VITAL SIGNS: Temp:  [97.2 F (36.2 C)-101.7 F (38.7 C)] 97.2 F (36.2 C) (09/25 0800) Pulse Rate:  [72-113] 79 (09/25 0830) Resp:  [12-25] 13 (09/25 0830) BP: (66-128)/(41-61) 128/61 mmHg (09/25 0830) SpO2:  [97 %-100 %] 100 % (09/25 0830) Arterial Line BP: (79-132)/(36-63) 123/53 mmHg (09/25 0800) FiO2 (%):  [40 %] 40 % (09/25 0830) Weight:  [96.6 kg (212 lb 15.4 oz)] 96.6 kg (212 lb 15.4 oz) (09/25 0500) HEMODYNAMICS:   VENTILATOR SETTINGS: Vent Mode:  [-] PRVC FiO2 (%):  [40 %] 40 % Set Rate:  [12 bmp] 12 bmp Vt Set:  [630 mL] 630 mL PEEP:  [5 cmH20] 5 cmH20 Plateau Pressure:  [13 cmH20-22 cmH20] 16 cmH20 INTAKE / OUTPUT: Intake/Output     09/24 0701 - 09/25 0700 09/25 0701 - 09/26 0700   I.V. (mL/kg) 4314.4 (44.7) 134.4 (1.4)   NG/GT 290 50   IV Piggyback 700 110   Total Intake(mL/kg) 5304.4 (54.9) 294.4 (3)   Urine (mL/kg/hr) 235 (0.1) 20 (0.1)   Total Output 235 20   Net +5069.4 +274.4          PHYSICAL EXAMINATION:  Gen: sedated on vent HEENT: NCAT, EOMi PULM: crackles upper lobes, otherwise clear CV: Tachy, regular AB: BS+, soft, no guarding or rebound Ext: warm, massive edema all over Neuro: sedated heavily  on vent  LABS:  CBC  Recent Labs Lab 10/05/13 0400 10/05/13 0830 10/06/13 0500  WBC 35.5* 39.1* 43.8*  HGB 8.5* 8.1* 7.9*  HCT 25.8* 23.9* 23.9*  PLT 408* 341 296   Coag's  Recent Labs Lab 10/08/2013 1900 10/05/13 0200  APTT  --  53*  INR 6.04*  --    BMET  Recent Labs Lab 09/22/2013 1900 10/05/13 0830 10/06/13 0500  NA 141 139 140  K 4.7 5.6* 5.5*  CL 113* 113* 110  CO2 15* 15* 15*  BUN 38* 46* 59*  CREATININE 2.73* 3.49* 4.19*  GLUCOSE 141* 169* 117*   Electrolytes  Recent Labs Lab 09/30/2013 1900 10/05/13 0830 10/06/13 0500  CALCIUM 6.9* 6.5* 6.2*  MG 1.8  --   --   PHOS 6.7*  --   --    Sepsis Markers  Recent Labs Lab 09/30/2013 1145 09/24/2013 1900 10/05/13 0400 10/05/13 0806 10/06/13 0500  LATICACIDVEN 2.79* 3.2*  --  2.6*  --   PROCALCITON  --  26.82 46.12  --  39.52   ABG  Recent Labs Lab 09/21/2013 1444 10/07/2013 1629 10/05/13 0743  PHART 7.201* 7.199* 7.152*  PCO2ART 41.6 43.4 44.6  PO2ART 67.0* 417.0* 113.0*   Liver Enzymes  Recent Labs Lab 09/22/2013 1135 09/13/2013 1900  AST 111* 118*  ALT 156*  147*  ALKPHOS 368* 414*  BILITOT 1.4* 1.5*  ALBUMIN 1.8* 1.6*   Cardiac Enzymes  Recent Labs Lab 10/03/2013 1135 09/22/2013 1900 10/05/13 0200 10/05/13 0800  TROPONINI  --  <0.30 <0.30 <0.30  PROBNP 924.9*  --   --   --    Glucose  Recent Labs Lab 10/05/13 0756 10/05/13 1237 10/05/13 1554 10/05/13 1948 10/06/13 0054 10/06/13 0440  GLUCAP 150* 211* 238* 259* 130* 109*    Imaging Dg Chest Port 1 View  10/06/2013   CLINICAL DATA:  Intubated patient.  EXAM: PORTABLE CHEST - 1 VIEW  COMPARISON:  10/05/2013.  FINDINGS: Right upper lobe consolidation is slightly less dense than on the previous day's study, and decreased in extent when compared to the study dated 09/17/2013. There is left lung base opacity consistent with atelectasis. Remainder of the lungs is clear.  No pleural effusion.  No pneumothorax.  Endotracheal tube tip  projects 4 cm above the carina. Nasogastric tube passes below the diaphragm well within the stomach.  IMPRESSION: 1. Continued improvement in the right upper lobe pneumonia. 2. Mild left lung base atelectasis similar to the previous day's study. 3. Support apparatus is stable in well positioned.   Electronically Signed   By: Lajean Manes M.D.   On: 10/06/2013 07:24   Portable Chest Xray In Am  10/05/2013   CLINICAL DATA:  Respiratory failure  EXAM: PORTABLE CHEST - 1 VIEW  COMPARISON:  October 04, 2013  FINDINGS: Endotracheal tube tip is 3.8 cm above the carina. Nasogastric tube tip and side port are below the diaphragm. There has been interval removal of right jugular catheter. No pneumothorax. There is persistent airspace consolidation in the right upper lobe. Elsewhere lungs are clear. Heart size and pulmonary vascularity are normal. No adenopathy.  IMPRESSION: Persistent right upper lobe airspace consolidation. No new opacity. Tube positions as described without pneumothorax. Interval removal of right jugular catheter.   Electronically Signed   By: Lowella Grip M.D.   On: 10/05/2013 07:09   Dg Chest Portable 1 View  09/20/2013   CLINICAL DATA:  Shortness of breath, code sepsis  EXAM: PORTABLE CHEST - 1 VIEW  COMPARISON:  Portable chest x-ray of earlier today at 13:10 hrs.  FINDINGS: The patient has undergone interval intubation of the trachea and esophagus. The endotracheal tube tip lies 3.4 cm above the crotch of the carina. The tip and proximal port of the esophagogastric tube project below the inferior margin of the image. The tip of the pre-existing right internal jugular venous catheter overlies the proximal SVC.  The confluent alveolar density in the right upper lobe persists. Elsewhere the lung parenchyma is clear. The heart and pulmonary vascularity within the limits of normal.  IMPRESSION: 1. There is persistent alveolar density in the right upper lobe consistent with pneumonia. 2.  Positioning of the endotracheal tube is good approximately 3.4 cm above the crotch of the carina.   Electronically Signed   By: David  [place]   On: 09/27/2013 16:38   Dg Chest Portable 1 View  09/29/2013   CLINICAL DATA:  Central catheter placement  EXAM: PORTABLE CHEST - 1 VIEW  COMPARISON:  Study obtained earlier in the day  FINDINGS: Central catheter tip is in the superior vena cava. No pneumothorax. Consolidation in a portion of the right upper lobe is stable. Lungs elsewhere clear. Heart size and pulmonary vascularity are normal. No adenopathy. No bone lesions.  IMPRESSION: Right upper lobe pneumonia unchanged from earlier in the day.  Central catheter tip in superior vena cava without pneumothorax. No new infiltrate seen.   Electronically Signed   By: Lowella Grip M.D.   On: 09/23/2013 13:18   Dg Chest Portable 1 View  09/19/2013   CLINICAL DATA:  Code sepsis; shortness of breath and fever; history of diabetes and acute renal injury  EXAM: PORTABLE CHEST - 1 VIEW  COMPARISON:  PA and lateral chest x-ray of September 25, 2013  FINDINGS: There is new confluent alveolar density in the right upper lobe. Elsewhere the lungs exhibit minimal stable interstitial prominence. The heart and pulmonary vascularity are normal. The trachea is midline. There is no pleural effusion. The right internal jugular venous catheter has been removed. The observed bony structures are unremarkable.  IMPRESSION: New right upper lobe pneumonia. The examination is otherwise unchanged.   Electronically Signed   By: David  [place]   On: 09/21/2013 12:25   US Abdomen Limited Ruq  10/05/2013   CLINICAL DATA:  Septic shock. Obstructive pattern on LFTs. Status post cholecystectomy.  EXAM: US ABDOMEN LIMITED - RIGHT UPPER QUADRANT  COMPARISON:  None.  FINDINGS: Gallbladder:  Surgically absent  Common bile duct:  Diameter: 6 mm  Liver:  No focal lesion identified. Within normal limits in parenchymal echogenicity.  IMPRESSION: Status  post cholecystectomy. Common bile duct within normal limits for post cholecystectomy state.   Electronically Signed   By: Lovey Newcomer M.D.   On: 10/05/2013 16:16    ASSESSMENT / PLAN:  PULMONARY OETT 9/23 >>> A: Acute hypoxemic respiratory failure MRSA HCAP P:   Full mechanical support, wean as able. VAP bundle. SBT if/when pressors weaned Abx as per ID section. ABG and CXR in AM.  CARDIOVASCULAR CVL R IJ 9/23 placed by ED >>>9/23 (pulled out by patient) CVL R Fem 9/23>> A Line R Fem 9/23 >>>9/25 (pulled out by patient) A:  Septic Shock - refractory to pressors Hx HTN DVT  left posterior tibial vein - per LE dopplers 9/10. P:  Levophed gtt, neo gtt, vasopression > titrated to MAP > 65 heparin gtt for DVT hydrocortisone for stress dose steroids See discussion below  RENAL A:   AKI - with severe acidosis, patient/family does not want hemodialysis P:   Bicarb gtt BMP in AM.  GASTROINTESTINAL A:   Nutrition GI prophylaxis Elevated alk phos/transaminasis, history of cholestectomy> normal RUQ U/S 9/24 P:   NPO. Nutrition consult for TF's > start today SUP: Famotidine.  HEMATOLOGIC A:   VTE Prophylaxis DVT left posterior tibial vein - per LE dopplers 9/10. P:  Heparin gtt. .  CBC in AM. Hold outpatient Xarelto.  INFECTIOUS A:   Septic Shock -severe, secondary to MRSA HCAP P:   BCx2 9/23 > GPC UCx 9/23 Sputum Cx 9/23 > Staph Aureus Abx: Vanc, start date 9/23, day 3/x. Stop Zosyn Start Imipenem 9/24 >>9/25 Start diflucan 9/24 >>9/25  Draw f/u blood cultures if speciation is staph, may need TEE if survives  ENDOCRINE A:   DM P:   ICU hyperglycemia protocol phase 1.  NEUROLOGIC A:   Acute metabolic encephalopathy P:   Sedation:  Fentanyl gtt. RASS goal: 0 to -1. Daily WUA.   TODAY'S SUMMARY: 68 y/o male admitted from nursing home with refractory septic shock and AKI due to Staph pneumonia. See lengthy discussion regarding code status.   Overall poor prognosis.  Strategy is to continue with pressors and full support otherwise but hold off on further invasive procedures and no HD.  Will update family  9/25 PM  I have personally obtained a history, examined the patient, evaluated laboratory and imaging results, formulated the assessment and plan and placed orders. CRITICAL CARE: The patient is critically ill with multiple organ systems failure and requires high complexity decision making for assessment and support, frequent evaluation and titration of therapies, application of advanced monitoring technologies and extensive interpretation of multiple databases. Critical Care Time devoted to patient care services described in this note is 45 minutes.    Roselie Awkward, MD [place] [facility] Pager: [phone] Cell: [phone] If no response, call [phone]

## 2013-10-06 NOTE — Significant Event (Signed)
CRITICAL VALUE ALERT  Critical value received:  Positive blood culture aerobic bottle, gram positive cocci in clusters  Date of notification:  10/06/2013  Time of notification:  [place] value read back:Yes.    Nurse who received alert:  Lesli Albee  MD notifiedTitus Mould at [phone].

## 2013-10-06 NOTE — Progress Notes (Addendum)
ANTIBIOTIC CONSULT NOTE - FOLLOW UP  Pharmacy Consult for vancomycin  Indication: Staph in ETT and 1/2 blood cultures  No Known Allergies  Patient Measurements: Height: 6' (182.9 cm) Weight: 212 lb 15.4 oz (96.6 kg) IBW/kg (Calculated) : 77.6 Vital Signs: Temp: 96.3 F (35.7 C) (09/25 1200) Temp src: Core (Comment) (09/25 0800) BP: 128/61 mmHg (09/25 0830) Pulse Rate: 77 (09/25 1200) Intake/Output from previous day: 09/24 0701 - 09/25 0700 In: 5304.4 [I.V.:4314.4; NG/GT:290; IV Piggyback:700] Out: 235 [Urine:235] Intake/Output from this shift: Total I/O In: 1262.4 [I.V.:552.4; NG/GT:50; IV Piggyback:660] Out: 125 [Urine:125]  Labs:  Recent Labs  10/05/13 0400 10/05/13 0830 10/06/13 0500 10/06/13 1332  WBC 35.5* 39.1* 43.8*  --   HGB 8.5* 8.1* 7.9*  --   PLT 408* 341 296  --   CREATININE  --  3.49* 4.19* 3.96*   Estimated Creatinine Clearance: 21.5 ml/min (by C-G formula based on Cr of 3.96).  Recent Labs  10/06/13 1115  [place] 20.7*     Microbiology: Recent Results (from the past 720 hour(s))  CULTURE, BLOOD (ROUTINE X 2)     Status: None   Collection Time    09/15/13 10:15 AM      Result Value Ref Range Status   Specimen Description BLOOD LEFT HAND   Final   Special Requests BOTTLES DRAWN AEROBIC AND ANAEROBIC 10CC   Final   Culture  Setup Time     Final   Value: 09/15/2013 14:27     Performed at [facility]   Culture     Final   Value: NO GROWTH 5 DAYS     Performed at [facility]   Report Status 09/21/2013 FINAL   Final  CULTURE, BLOOD (ROUTINE X 2)     Status: None   Collection Time    09/15/13 10:30 AM      Result Value Ref Range Status   Specimen Description BLOOD LEFT HAND   Final   Special Requests BOTTLES DRAWN AEROBIC AND ANAEROBIC 10CC   Final   Culture  Setup Time     Final   Value: 09/15/2013 14:28     Performed at [facility]   Culture     Final   Value: NO GROWTH 5 DAYS     Performed at [facility]   Report Status 09/21/2013 FINAL   Final  MRSA PCR SCREENING     Status: None   Collection Time    09/15/13  4:45 PM      Result Value Ref Range Status   MRSA by PCR NEGATIVE  NEGATIVE Final   Comment:            The GeneXpert MRSA Assay (FDA     approved for NASAL specimens     only), is one component of a     comprehensive MRSA colonization     surveillance program. It is not     intended to diagnose MRSA     infection nor to guide or     monitor treatment for     MRSA infections.  CLOSTRIDIUM DIFFICILE BY PCR     Status: None   Collection Time    09/19/13  8:27 AM      Result Value Ref Range Status   C difficile by pcr NEGATIVE  NEGATIVE Final  STOOL CULTURE     Status: None   Collection Time    09/20/13  5:04 PM      Result  Value Ref Range Status   Specimen Description STOOL   Final   Special Requests NONE   Final   Culture     Final   Value: NO SALMONELLA, SHIGELLA, CAMPYLOBACTER, YERSINIA, OR E.COLI 0157:H7 ISOLATED     Note: REDUCED NORMAL FLORA PRESENT     Performed at [facility]   Report Status 09/24/2013 FINAL   Final  CULTURE, EXPECTORATED SPUTUM-ASSESSMENT     Status: None   Collection Time    09/21/13 11:41 AM      Result Value Ref Range Status   Specimen Description SPUTUM   Final   Special Requests NONE   Final   Sputum evaluation     Final   Value: THIS SPECIMEN IS ACCEPTABLE. RESPIRATORY CULTURE REPORT TO FOLLOW.   Report Status 09/21/2013 FINAL   Final  CULTURE, RESPIRATORY (NON-EXPECTORATED)     Status: None   Collection Time    09/21/13 11:41 AM      Result Value Ref Range Status   Specimen Description SPUTUM   Final   Special Requests NONE   Final   Gram Stain     Final   Value: RARE WBC PRESENT,BOTH PMN AND MONONUCLEAR     RARE SQUAMOUS EPITHELIAL CELLS PRESENT     NO ORGANISMS SEEN     Performed at [facility]   Culture     Final   Value: FEW CANDIDA ALBICANS     Performed at [facility]   Report  Status 09/23/2013 FINAL   Final  CULTURE, BLOOD (ROUTINE X 2)     Status: None   Collection Time    09/21/13  3:54 PM      Result Value Ref Range Status   Specimen Description BLOOD RIGHT ARM   Final   Special Requests BOTTLES DRAWN AEROBIC AND ANAEROBIC 10CC   Final   Culture  Setup Time     Final   Value: 09/21/2013 19:21     Performed at [facility]   Culture     Final   Value: NO GROWTH 5 DAYS     Performed at [facility]   Report Status 09/27/2013 FINAL   Final  CULTURE, BLOOD (ROUTINE X 2)     Status: None   Collection Time    09/21/13  4:05 PM      Result Value Ref Range Status   Specimen Description BLOOD RIGHT HAND   Final   Special Requests BOTTLES DRAWN AEROBIC ONLY 8CC   Final   Culture  Setup Time     Final   Value: 09/21/2013 19:18     Performed at [facility]   Culture     Final   Value: NO GROWTH 5 DAYS     Performed at [facility]   Report Status 09/27/2013 FINAL   Final  CSF CULTURE     Status: None   Collection Time    09/21/13  4:41 PM      Result Value Ref Range Status   Specimen Description CSF   Final   Special Requests 1.3ML CSF FLUID NO 2   Final   Gram Stain     Final   Value: WBC PRESENT, PREDOMINANTLY MONONUCLEAR     NO ORGANISMS SEEN     CYTOSPIN Performed at [facility]     Performed at [facility]   Culture     Final   Value: NO GROWTH 3 DAYS  Performed at [facility]   Report Status 09/24/2013 FINAL   Final  GRAM STAIN     Status: None   Collection Time    09/21/13  4:41 PM      Result Value Ref Range Status   Specimen Description CSF   Final   Special Requests 1.3ML CSF FLUID   Final   Gram Stain     Final   Value: WBC PRESENT, PREDOMINANTLY MONONUCLEAR     NO ORGANISMS SEEN     CYTOSPIN SLIDE   Report Status 09/21/2013 FINAL   Final  AFB CULTURE WITH SMEAR     Status: None   Collection Time    09/21/13  4:43 PM      Result Value Ref Range Status   Specimen  Description CSF   Final   Special Requests 1.3ML CSF FLUID   Final   Acid Fast Smear     Final   Value: NO ACID FAST BACILLI SEEN     Performed at [facility]   Culture     Final   Value: CULTURE WILL BE EXAMINED FOR 6 WEEKS BEFORE ISSUING A FINAL REPORT     Performed at [facility]   Report Status PENDING   Incomplete  URINE CULTURE     Status: None   Collection Time    09/25/13 11:39 AM      Result Value Ref Range Status   Specimen Description URINE, CLEAN CATCH   Final   Special Requests NONE   Final   Culture  Setup Time     Final   Value: 09/25/2013 15:01     Performed at [facility]     Final   Value: 35,000 COLONIES/ML     Performed at [facility]   Culture     Final   Value: STAPHYLOCOCCUS SPECIES (COAGULASE NEGATIVE)     Note: RIFAMPIN AND GENTAMICIN SHOULD NOT BE USED AS SINGLE DRUGS FOR TREATMENT OF STAPH INFECTIONS.     Performed at [facility]   Report Status 09/29/2013 FINAL   Final   Organism ID, Bacteria STAPHYLOCOCCUS SPECIES (COAGULASE NEGATIVE)   Final  CULTURE, BLOOD (ROUTINE X 2)     Status: None   Collection Time    09/25/13  1:50 PM      Result Value Ref Range Status   Specimen Description BLOOD LEFT HAND   Final   Special Requests BOTTLES DRAWN AEROBIC AND ANAEROBIC 10CC   Final   Culture  Setup Time     Final   Value: 09/25/2013 17:11     Performed at [facility]   Culture     Final   Value: NO GROWTH 5 DAYS     Performed at [facility]   Report Status 10/01/2013 FINAL   Final  CULTURE, BLOOD (ROUTINE X 2)     Status: None   Collection Time    09/25/13  1:55 PM      Result Value Ref Range Status   Specimen Description BLOOD LEFT ARM   Final   Special Requests     Final   Value: BOTTLES DRAWN AEROBIC AND ANAEROBIC 10CC BLUE, 5CC RED   Culture  Setup Time     Final   Value: 09/25/2013 17:11     Performed at [facility]   Culture     Final   Value: NO GROWTH  5 DAYS  Performed at [facility]   Report Status 10/01/2013 FINAL   Final  CULTURE, BLOOD (ROUTINE X 2)     Status: None   Collection Time    10/06/2013 12:00 PM      Result Value Ref Range Status   Specimen Description BLOOD RIGHT ANTECUBITAL   Final   Special Requests BOTTLES DRAWN AEROBIC AND ANAEROBIC 6MLS   Final   Culture  Setup Time     Final   Value: 10/07/2013 22:36     Performed at [facility]   Culture     Final   Value:        BLOOD CULTURE RECEIVED NO GROWTH TO DATE CULTURE WILL BE HELD FOR 5 DAYS BEFORE ISSUING A FINAL NEGATIVE REPORT     Performed at [facility]   Report Status PENDING   Incomplete  CULTURE, BLOOD (ROUTINE X 2)     Status: None   Collection Time    09/16/2013  1:20 PM      Result Value Ref Range Status   Specimen Description BLOOD PICC LINE   Final   Special Requests BOTTLES DRAWN AEROBIC AND ANAEROBIC 6CC RIGHT IJ   Final   Culture  Setup Time     Final   Value: 10/10/2013 23:53     Performed at [facility]   Culture     Final   Value: GRAM POSITIVE COCCI IN CLUSTERS     Note: Gram Stain Report Called to,Read Back By and Verified With: JENNIFER CLIFTON 10/06/13 3:35AM [place]     Performed at [facility]   Report Status PENDING   Incomplete  CULTURE, RESPIRATORY (NON-EXPECTORATED)     Status: None   Collection Time    09/12/2013  3:25 PM      Result Value Ref Range Status   Specimen Description ENDOTRACHEAL   Final   Special Requests NONE   Final   Gram Stain     Final   Value: ABUNDANT WBC PRESENT,BOTH PMN AND MONONUCLEAR     NO SQUAMOUS EPITHELIAL CELLS SEEN     ABUNDANT GRAM POSITIVE COCCI     IN PAIRS IN CLUSTERS     Performed at [facility]   Culture     Final   Value: ABUNDANT STAPHYLOCOCCUS AUREUS     Note: RIFAMPIN AND GENTAMICIN SHOULD NOT BE USED AS SINGLE DRUGS FOR TREATMENT OF STAPH INFECTIONS.     Performed at [facility]   Report Status PENDING   Incomplete  MRSA PCR  SCREENING     Status: Abnormal   Collection Time    09/13/2013  5:05 PM      Result Value Ref Range Status   MRSA by PCR POSITIVE (*) NEGATIVE Final   Comment:            The GeneXpert MRSA Assay (FDA     approved for NASAL specimens     only), is one component of a     comprehensive MRSA colonization     surveillance program. It is not     intended to diagnose MRSA     infection nor to guide or     monitor treatment for     MRSA infections.     RESULT CALLED TO, READ BACK BY AND VERIFIED WITH:     C.[place],RN AT [facility] 10/06/2013  URINE CULTURE     Status: None   Collection Time    10/05/2013  7:42 PM      Result Value Ref Range Status   Specimen Description URINE, CATHETERIZED   Final   Special Requests NONE   Final   Culture  Setup Time     Final   Value: 09/12/2013 20:15     Performed at [facility]     Final   Value: NO GROWTH     Performed at [facility]   Culture     Final   Value: NO GROWTH     Performed at [facility]   Report Status 10/06/2013 FINAL   Final  CULTURE, RESPIRATORY (NON-EXPECTORATED)     Status: None   Collection Time    10/05/13  4:54 PM      Result Value Ref Range Status   Specimen Description TRACHEAL ASPIRATE   Final   Special Requests NONE   Final   Gram Stain     Final   Value: ABUNDANT WBC PRESENT,BOTH PMN AND MONONUCLEAR     RARE SQUAMOUS EPITHELIAL CELLS PRESENT     FEW GRAM POSITIVE COCCI     IN PAIRS IN CLUSTERS     Performed at [facility]   Culture     Final   Value: Culture reincubated for better growth     Performed at [facility]   Report Status PENDING   Incomplete    Anti-infectives   Start     Dose/Rate Route Frequency Ordered Stop   10/05/13 1200  fluconazole (DIFLUCAN) IVPB 200 mg  Status:  Discontinued     200 mg 100 mL/hr over 60 Minutes Intravenous Every 24 hours 10/05/13 1048 10/06/13 0849   10/05/13 0900  imipenem-cilastatin (PRIMAXIN) 250 mg in  sodium chloride 0.9 % 100 mL IVPB  Status:  Discontinued     250 mg 200 mL/hr over 30 Minutes Intravenous Every 6 hours 10/05/13 0813 10/06/13 0849   09/18/2013 2300  vancomycin (VANCOCIN) IVPB 750 mg/150 ml premix  Status:  Discontinued     750 mg 150 mL/hr over 60 Minutes Intravenous Every 12 hours 10/07/2013 1155 10/05/13 1312   09/16/2013 2300  vancomycin (VANCOCIN) IVPB 750 mg/150 ml premix  Status:  Discontinued     750 mg 150 mL/hr over 60 Minutes Intravenous Every 12 hours 10/09/2013 1155 09/30/2013 1157   09/19/2013 2300  ceFEPIme (MAXIPIME) 1 g in dextrose 5 % 50 mL IVPB  Status:  Discontinued     1 g 100 mL/hr over 30 Minutes Intravenous Every 12 hours 09/13/2013 1157 10/03/2013 1610   09/22/2013 2200  piperacillin-tazobactam (ZOSYN) IVPB 3.375 g  Status:  Discontinued     3.375 g 12.5 mL/hr over 240 Minutes Intravenous 3 times per day 09/28/2013 1611 10/05/13 0811   10/02/2013 1145  ceFEPIme (MAXIPIME) 2 g in dextrose 5 % 50 mL IVPB     2 g 100 mL/hr over 30 Minutes Intravenous  Once 09/30/2013 1142 09/25/2013 1447   10/02/2013 1145  vancomycin (VANCOCIN) IVPB 1000 mg/200 mL premix     1,000 mg 200 mL/hr over 60 Minutes Intravenous  Once 10/03/2013 1142 09/29/2013 1430      Assessment: 68 YOM on day # 2 of vancomycin for staph in the ETT and 1/2 blood cultures. Patient is in acute renal failure with current SCr up to 4.19. UOP recorded as 0.1 cc/kg/hr and per RN is dropping off. Vancomycin trough today is 20.7.   Goal of Therapy:  Vancomycin trough level 15-20 mcg/ml  Plan:  1. Will plan to resume vancomycin on 10/07/13 if patient's UOP improves/continues & SCr improves.  2. No further vancomycin today.  Sloan Leiter, PharmD, BCPS Clinical Pharmacist [phone] 10/06/2013,2:34 PM

## 2013-10-06 NOTE — Progress Notes (Signed)
Pharmacy note: Vancomycin  68 yo male with GPC bacteremia and now noted with blood cultures showing GPC (2/2). Vancomycin level this am was 20.7  Plan -Vancomycin is currently on hold due to acute renal failure -Plans to resume vancomycin on 9/26 based on renal trends -No further vancomycin today  Hildred Laser, Pharm D 10/06/2013 8:32 PM

## 2013-10-06 NOTE — Progress Notes (Signed)
  Echocardiogram 2D Echocardiogram has been performed.  Vanna Sailer FRANCES 10/06/2013, 10:32 AM

## 2013-10-06 NOTE — Progress Notes (Signed)
CRITICAL VALUE ALERT  Critical value received:  Gram Positive Cocci Cluster found in blood culture  Date of notification:  10/06/13  Time of notification:  0200  Critical value read back: Yes  Nurse who received alert:  Rito Ehrlich, RN  MD notified (1st page):  Dr. Jimmy Footman  Time of first page:  0200  MD notified (2nd page):  Time of second page:  Responding MD:  Dr. Jimmy Footman  Time MD responded:  [phone]

## 2013-10-07 ENCOUNTER — Inpatient Hospital Stay (HOSPITAL_COMMUNITY): Payer: Medicare Other

## 2013-10-07 DIAGNOSIS — A4101 Sepsis due to Methicillin susceptible Staphylococcus aureus: Secondary | ICD-10-CM | POA: Diagnosis not present

## 2013-10-07 DIAGNOSIS — J9600 Acute respiratory failure, unspecified whether with hypoxia or hypercapnia: Secondary | ICD-10-CM

## 2013-10-07 DIAGNOSIS — N170 Acute kidney failure with tubular necrosis: Secondary | ICD-10-CM

## 2013-10-07 DIAGNOSIS — Z66 Do not resuscitate: Secondary | ICD-10-CM | POA: Diagnosis not present

## 2013-10-07 LAB — GLUCOSE, CAPILLARY
GLUCOSE-CAPILLARY: 128 mg/dL — AB (ref 70–99)
GLUCOSE-CAPILLARY: 153 mg/dL — AB (ref 70–99)
GLUCOSE-CAPILLARY: 95 mg/dL (ref 70–99)
Glucose-Capillary: 161 mg/dL — ABNORMAL HIGH (ref 70–99)
Glucose-Capillary: 184 mg/dL — ABNORMAL HIGH (ref 70–99)

## 2013-10-07 LAB — BASIC METABOLIC PANEL
Anion gap: 18 — ABNORMAL HIGH (ref 5–15)
BUN: 71 mg/dL — AB (ref 6–23)
CALCIUM: 5.9 mg/dL — AB (ref 8.4–10.5)
CO2: 17 mEq/L — ABNORMAL LOW (ref 19–32)
Chloride: 103 mEq/L (ref 96–112)
Creatinine, Ser: 4.69 mg/dL — ABNORMAL HIGH (ref 0.50–1.35)
GFR, EST AFRICAN AMERICAN: 13 mL/min — AB (ref >90)
GFR, EST NON AFRICAN AMERICAN: 12 mL/min — AB (ref >90)
Glucose, Bld: 194 mg/dL — ABNORMAL HIGH (ref 70–99)
Potassium: 5.5 mEq/L — ABNORMAL HIGH (ref 3.7–5.3)
SODIUM: 138 meq/L (ref 137–147)

## 2013-10-07 LAB — CBC
HCT: 22.4 % — ABNORMAL LOW (ref 39.0–52.0)
Hemoglobin: 8 g/dL — ABNORMAL LOW (ref 13.0–17.0)
MCH: 32.3 pg (ref 26.0–34.0)
MCHC: 35.7 g/dL (ref 30.0–36.0)
MCV: 90.3 fL (ref 78.0–100.0)
Platelets: 181 10*3/uL (ref 150–400)
RBC: 2.48 MIL/uL — ABNORMAL LOW (ref 4.22–5.81)
RDW: 18 % — AB (ref 11.5–15.5)
WBC: 39.6 10*3/uL — ABNORMAL HIGH (ref 4.0–10.5)

## 2013-10-07 LAB — VANCOMYCIN, TROUGH: Vancomycin Tr: 17 ug/mL (ref 10.0–20.0)

## 2013-10-07 LAB — PROTIME-INR
INR: 1.77 — AB (ref 0.00–1.49)
Prothrombin Time: 20.6 seconds — ABNORMAL HIGH (ref 11.6–15.2)

## 2013-10-07 MED ORDER — WHITE PETROLATUM GEL
Status: AC
Start: 2013-10-07 — End: 2013-10-07
  Administered 2013-10-07: 10:00:00
  Filled 2013-10-07: qty 5

## 2013-10-07 MED ORDER — AQUAPHOR EX OINT
TOPICAL_OINTMENT | Freq: Every day | CUTANEOUS | Status: DC | PRN
  Administered 2013-10-10: 18:00:00 via TOPICAL
  Filled 2013-10-07: qty 50

## 2013-10-07 MED ORDER — VANCOMYCIN HCL IN DEXTROSE 1-5 GM/200ML-% IV SOLN
1000.0000 mg | Freq: Once | INTRAVENOUS | Status: DC
  Administered 2013-10-07: 1000 mg via INTRAVENOUS
  Filled 2013-10-07: qty 200

## 2013-10-07 MED ORDER — LINEZOLID 2 MG/ML IV SOLN: 600.0000 mg | Freq: Two times a day (BID) | INTRAVENOUS | Status: DC

## 2013-10-07 NOTE — Progress Notes (Signed)
[place] Progress Note Patient Name: William Stark DOB: December 06, 1945 MRN: [id number]   Date of Service  10/07/2013  HPI/Events of Note  hypocalcemic  eICU Interventions  Replete Ca     Intervention Category Minor Interventions: Electrolytes abnormality - evaluation and management  Homar Weinkauf 10/07/2013, 6:55 AM

## 2013-10-07 NOTE — Progress Notes (Signed)
ANTIBIOTIC CONSULT NOTE - FOLLOW UP  Pharmacy Consult for Vancomycin Indication: MSSA in Resp cx, Staph in Blood  No Known Allergies  Patient Measurements: Height: 6' (182.9 cm) Weight: 218 lb 14.7 oz (99.3 kg) IBW/kg (Calculated) : 77.6  Vital Signs: Temp: 95.2 F (35.1 C) (09/26 1300) BP: 114/72 mmHg (09/26 1300) Pulse Rate: 70 (09/26 1300) Intake/Output from previous day: 09/25 0701 - 09/26 0700 In: 3571.2 [I.V.:2691.2; NG/GT:110; IV Piggyback:770] Out: 580 [Urine:580] Intake/Output from this shift: Total I/O In: 537.4 [I.V.:537.4] Out: 300 [Urine:300]  Labs:  Recent Labs  10/05/13 0830 10/06/13 0500 10/06/13 1332 10/07/13 0600  WBC 39.1* 43.8*  --  39.6*  HGB 8.1* 7.9*  --  8.0*  PLT 341 296  --  181  CREATININE 3.49* 4.19* 3.96* 4.69*   Estimated Creatinine Clearance: 18.4 ml/min (by C-G formula based on Cr of 4.69).  Recent Labs  10/06/13 1115 10/07/13 1305  VANCOTROUGH 20.7* 17.0    Vanc 9/23>> 9/25 VT = 20.7 (24h post last dose of vanc) 9/26 VT = 17 Zosyn 9/23>>9/24 Primaxin 9/24>>9/25 Fluconazole 9/24>>9/25  9/23 Endotrach - MSSA 9/23 Blood - 2/2 staph   Assessment: 89 YOM on day # 2 of vancomycin for r/o bacteremia and MSSA pneumonia. Blood cultures have not yet been speciated. Tmax 102.8, WBC down to 39.6, SCr steadily climbing at 4.69. PCT 39.12. LA 2.6.  Patient is now DNI/DNR. Vancomycin trough down from 20.7 to 17. Will redose today.   Goal of Therapy:  Vancomycin trough level 15-20 mcg/ml  Plan:  1. Vancomycin 1000mg  IV x1 today. Dosing based on levels due to acute renal failure. 2. No scheduled dosing due to acute renal failure. 3. Monitor UOP and SCR for further dosing.  4. Follow-up blood culture result and ability to narrow therapy.  Sloan Leiter, PharmD, BCPS Clinical Pharmacist [phone] 10/07/2013,3:42 PM

## 2013-10-07 NOTE — Progress Notes (Signed)
PULMONARY / CRITICAL CARE MEDICINE   Name: William Stark MRN: [id number] DOB: 09-23-1945    ADMISSION DATE:  09/22/2013 CONSULTATION DATE:  10/07/2013  REFERRING MD :  EDP  CHIEF COMPLAINT:  Fever  INITIAL PRESENTATION: 68 y.o. M, nursing home resident [facility]) brought to [facility] ED on 9/23 with fever x 1 day to 101.5.  In ED, developed respiratory failure, failed BiPAP and required intubation.  PCCM was consulted for admission.   RUQ U/S 9/24 > normal CBD, no gallbladder  SIGNIFICANT EVENTS: 9/23 - intubated while in ED (by CCM), admitted to ICU. RUL PNA on CXR 9/23 - family wants limited code, no HD 9/24 > LVEF 55-60%, wall motion abnormalities 9/25: Staph in sputum, GPC in blood, minimal UOP, hyper k,    SUBJECTIVE/OVERNIGHT/INTERVAL HX 10/07/13:  Patient now full no code blue without desire for escalation - per RN ok for pressors but NO CPR and no HD. GPC in blood +. On levophed and vaso  VITAL SIGNS: Temp:  [95.2 F (35.1 C)-98 F (36.7 C)] 95.2 F (35.1 C) (09/26 1300) Pulse Rate:  [70-93] 70 (09/26 1300) Resp:  [9-20] 20 (09/26 1300) BP: (101-149)/(55-102) 114/72 mmHg (09/26 1300) SpO2:  [99 %-100 %] 100 % (09/26 1300) FiO2 (%):  [30 %-40 %] 30 % (09/26 1205) Weight:  [99.3 kg (218 lb 14.7 oz)] 99.3 kg (218 lb 14.7 oz) (09/26 0600) HEMODYNAMICS:   VENTILATOR SETTINGS: Vent Mode:  [-] PRVC FiO2 (%):  [30 %-40 %] 30 % Set Rate:  [12 bmp] 12 bmp Vt Set:  [630 mL] 630 mL PEEP:  [5 cmH20] 5 cmH20 Plateau Pressure:  [11 cmH20-14 cmH20] 14 cmH20 INTAKE / OUTPUT: Intake/Output     09/25 0701 - 09/26 0700 09/26 0701 - 09/27 0700   I.V. (mL/kg) 2691.2 (27.1) 537.4 (5.4)   NG/GT 110    IV Piggyback 770    Total Intake(mL/kg) 3571.2 (36) 537.4 (5.4)   Urine (mL/kg/hr) 580 (0.2) 300 (0.3)   Total Output 580 300   Net +2991.2 +237.4          PHYSICAL EXAMINATION: Gen: sedated on vent HEENT: NCAT, EOMi PULM: crackles upper lobes, otherwise clear CV: Tachy,  regular AB: BS+, soft, no guarding or rebound Ext: warm, massive edema all over Neuro: RASS -3 heavily on vent  LABS: PULMONARY  Recent Labs Lab 10/03/2013 1444 09/15/2013 1629 10/05/13 0743 10/06/13 0858  PHART 7.201* 7.199* 7.152* 7.156*  PCO2ART 41.6 43.4 44.6 43.8  PO2ART 67.0* 417.0* 113.0* 122.0*  HCO3 16.3* 16.8* 15.2* 15.1*  TCO2 _0 16.5  O2SAT 88.0 100.0 96.0 97.1    CBC  Recent Labs Lab 10/05/13 0830 10/06/13 0500 10/07/13 0600  HGB 8.1* 7.9* 8.0*  HCT 23.9* 23.9* 22.4*  WBC 39.1* 43.8* 39.6*  PLT 341 296 181    COAGULATION  Recent Labs Lab 09/25/2013 1900 10/06/13 1115 10/07/13 1305  INR 6.04* 2.25* 1.77*    CARDIAC   Recent Labs Lab 10/03/2013 1900 10/05/13 0200 10/05/13 0800  TROPONINI <0.30 <0.30 <0.30    Recent Labs Lab 10/05/2013 1135  PROBNP 924.9*     CHEMISTRY  Recent Labs Lab 10/07/2013 1900 10/05/13 0830 10/06/13 0500 10/06/13 1332 10/07/13 0600  NA 141 139 140 137 138  K 4.7 5.6* 5.5* 5.0 5.5*  CL 113* 113* 110 109 103  CO2 15* 15* 15* 14* 17*  GLUCOSE 141* 169* 117* 155* 194*  BUN 38* 46* 59* 58* 71*  CREATININE 2.73* 3.49* 4.19* 3.96*  4.69*  CALCIUM 6.9* 6.5* 6.2* 5.8* 5.9*  MG 1.8  --   --   --   --   PHOS 6.7*  --   --   --   --    Estimated Creatinine Clearance: 18.4 ml/min (by C-G formula based on Cr of 4.69).   LIVER  Recent Labs Lab 10/07/2013 1135 09/13/2013 1900 10/06/13 1115 10/07/13 1305  AST 111* 118*  --   --   ALT 156* 147*  --   --   ALKPHOS 368* 414*  --   --   BILITOT 1.4* 1.5*  --   --   PROT 6.3 5.9*  --   --   ALBUMIN 1.8* 1.6*  --   --   INR  --  6.04* 2.25* 1.77*     INFECTIOUS  Recent Labs Lab 09/21/2013 1145 09/19/2013 1900 10/05/13 0400 10/05/13 0806 10/06/13 0500  LATICACIDVEN 2.79* 3.2*  --  2.6*  --   PROCALCITON  --  26.82 46.12  --  39.52     ENDOCRINE CBG (last 3)   Recent Labs  10/07/13 0355 10/07/13 0733 10/07/13 1146  GLUCAP 161* 184* 128*          IMAGING x48h Dg Chest Port 1 View  10/07/2013   CLINICAL DATA:  Intubation.  EXAM: PORTABLE CHEST - 1 VIEW  COMPARISON:  10/06/2013.  10/05/2013.  FINDINGS: Endotracheal tube and NG tube in stable position. Continued improvement of right upper lobe density suggesting improving pneumonia. Continued follow-up chest x-rays recommended demonstrate complete clearing. Persistent left lower lobe centrum atelectasis versus infiltrate. No prominent pleural effusion. No pneumothorax. Heart size stable. Pulmonary vascularity normal.  IMPRESSION: 1. Line and tubes in stable position.  2. Continued improvement of right upper lobe density suggesting improving pneumonia. Close follow-up chest x-ray is recommended to demonstrate complete clearing.  3. Left base subsegmental atelectasis and or mild infiltrate again noted.   Electronically Signed   By: Thomas  Register   On: 10/07/2013 09:27   Dg Chest Port 1 View  10/06/2013   CLINICAL DATA:  Intubated patient.  EXAM: PORTABLE CHEST - 1 VIEW  COMPARISON:  10/05/2013.  FINDINGS: Right upper lobe consolidation is slightly less dense than on the previous day's study, and decreased in extent when compared to the study dated 10/01/2013. There is left lung base opacity consistent with atelectasis. Remainder of the lungs is clear.  No pleural effusion.  No pneumothorax.  Endotracheal tube tip projects 4 cm above the carina. Nasogastric tube passes below the diaphragm well within the stomach.  IMPRESSION: 1. Continued improvement in the right upper lobe pneumonia. 2. Mild left lung base atelectasis similar to the previous day's study. 3. Support apparatus is stable in well positioned.   Electronically Signed   By: David  Ormond M.D.   On: 10/06/2013 07:24   Us Abdomen Limited Ruq  10/05/2013   CLINICAL DATA:  Septic shock. Obstructive pattern on LFTs. Status post cholecystectomy.  EXAM: US ABDOMEN LIMITED - RIGHT UPPER QUADRANT  COMPARISON:  None.  FINDINGS:  Gallbladder:  Surgically absent  Common bile duct:  Diameter: 6 mm  Liver:  No focal lesion identified. Within normal limits in parenchymal echogenicity.  IMPRESSION: Status post cholecystectomy. Common bile duct within normal limits for post cholecystectomy state.   Electronically Signed   By: Drew  Davis M.D.   On: 10/05/2013 16:16        ASSESSMENT / PLAN:  PULMONARY OETT 9/23 >>> A: Acute hypoxemic respiratory   failure MRSA v  MSSA  HCAP   - does not meet sbt criteria due to shock and agitation  P:   Full mechanical support, wean as able. VAP bundle. SBT if/when pressors weaned Abx as per ID section. ABG and CXR in AM.  CARDIOVASCULAR CVL R IJ 9/23 placed by ED >>>9/23 (pulled out by patient) CVL R Fem 9/23>> A Line R Fem 9/23 >>>9/25 (pulled out by patient)  Hx of HT  A:  Septic Shock - currently on levophed 20mcg DVT   left posterior tibial vein - per LE dopplers 9/10.   - improving pressor need P:  Levophed gtt, neo gtt, vasopression > titrated to MAP > 65 heparin gtt for DVT hydrocortisone for stress dose steroids See discussion below  RENAL A:   ATN  - with severe acidosis, patient/family does not want hemodialysis: officially ATN on 10/07/13. Family rejected JD  P:   Bicarb gtt BMP in AM. DC vanc after current dose  GASTROINTESTINAL A:   Nutrition GI prophylaxis Elevated alk phos/transaminasis, history of cholestectomy> normal RUQ U/S 9/24 P:   NPO. Nutrition consult for TF's > start today 10/07/13 SUP: Famotidine.  HEMATOLOGIC A:   VTE Prophylaxis DVT left posterior tibial vein - per LE dopplers 9/10. P:  Heparin gtt. .  CBC in AM. Hold outpatient Xarelto.  INFECTIOUS 09/23/2013 - MRSA PCR - positive Blood culture 9/23 - STaph aureus - sensit pending REsp 9/23 - abundant staph aureus - MSSA Resp 9/24 - few staph - pending  A:   Septic Shock -severe, secondary to MRSA v MSSA v both  HCAP  P:   Stop Zosyn Start Imipenem 9/24  >>9/25 Start diflucan 9/24 >>9/25 Abx: Vanc, start date 9/23, day 4/x;  Await specilation in blood; If MRSA change to linezolid. IF MSSA change to ancef   Draw f/u blood cultures if speciation is staph, may need TEE if survives  ENDOCRINE A:   DM P:   ICU hyperglycemia protocol phase 1.  NEUROLOGIC A:   Acute metabolic encephalopathy P:   Sedation:  Fentanyl gtt. RASS goal: 0 to -1. Daily WUA.   FAMILY 10/07/13: None updated  TODAY'S SUMMARY: 68 y/o male admitted from nursing home with refractory septic shock and AKI due to Staph pneumonia. See lengthy discussion regarding code status.  Overall poor prognosis.  Strategy is to continue with pressors and full support otherwise but hold off on further invasive procedures and no HD.     I have personally obtained a history, examined the patient, evaluated laboratory and imaging results, formulated the assessment and plan and placed orders. CRITICAL CARE: The patient is critically ill with multiple organ systems failure and requires high complexity decision making for assessment and support, frequent evaluation and titration of therapies, application of advanced monitoring technologies and extensive interpretation of multiple databases. Critical Care Time devoted to patient care services described in this note is 31 minutes.    Dr.  , M.D., F.C.C.P Pulmonary and Critical Care Medicine Staff Physician [facility] [place] [facility] Pager: [phone], If no answer or between  15:00h - 7:00h: call [phone]  10/07/2013 4:11 PM    

## 2013-10-08 DIAGNOSIS — Z66 Do not resuscitate: Secondary | ICD-10-CM

## 2013-10-08 LAB — POCT I-STAT 3, ART BLOOD GAS (G3+)
Acid-base deficit: 3 mmol/L — ABNORMAL HIGH (ref 0.0–2.0)
BICARBONATE: 22.3 meq/L (ref 20.0–24.0)
O2 SAT: 58 %
PCO2 ART: 39.8 mmHg (ref 35.0–45.0)
Patient temperature: 97.4
TCO2: 24 mmol/L (ref 0–100)
pH, Arterial: 7.354 (ref 7.350–7.450)
pO2, Arterial: 30 mmHg — CL (ref 80.0–100.0)

## 2013-10-08 LAB — GLUCOSE, CAPILLARY
GLUCOSE-CAPILLARY: 249 mg/dL — AB (ref 70–99)
GLUCOSE-CAPILLARY: 292 mg/dL — AB (ref 70–99)
GLUCOSE-CAPILLARY: 119 mg/dL — AB (ref 70–99)
Glucose-Capillary: 184 mg/dL — ABNORMAL HIGH (ref 70–99)

## 2013-10-08 LAB — CULTURE, BLOOD (ROUTINE X 2)

## 2013-10-08 LAB — CBC
HEMATOCRIT: 21.7 % — AB (ref 39.0–52.0)
Hemoglobin: 7.7 g/dL — ABNORMAL LOW (ref 13.0–17.0)
MCH: 30.9 pg (ref 26.0–34.0)
MCHC: 35.5 g/dL (ref 30.0–36.0)
MCV: 87.1 fL (ref 78.0–100.0)
Platelets: 139 10*3/uL — ABNORMAL LOW (ref 150–400)
RBC: 2.49 MIL/uL — ABNORMAL LOW (ref 4.22–5.81)
RDW: 17.7 % — ABNORMAL HIGH (ref 11.5–15.5)
WBC: 29.7 10*3/uL — AB (ref 4.0–10.5)

## 2013-10-08 LAB — CULTURE, RESPIRATORY

## 2013-10-08 LAB — CULTURE, RESPIRATORY W GRAM STAIN

## 2013-10-08 MED ORDER — WHITE PETROLATUM GEL
Status: AC
  Administered 2013-10-08: 10:00:00
  Filled 2013-10-08: qty 5

## 2013-10-08 MED ORDER — HYDROCERIN EX CREA
TOPICAL_CREAM | Freq: Two times a day (BID) | CUTANEOUS | Status: DC | PRN
  Administered 2013-10-10: 11:00:00 via TOPICAL
  Filled 2013-10-08: qty 113

## 2013-10-08 MED ORDER — CEFAZOLIN SODIUM 1-5 GM-% IV SOLN
1.0000 g | INTRAVENOUS | Status: DC
  Administered 2013-10-08: 1 g via INTRAVENOUS
  Filled 2013-10-08 (×2): qty 50

## 2013-10-08 MED ORDER — SODIUM CHLORIDE 0.9 % IV BOLUS (SEPSIS)
500.0000 mL | Freq: Once | INTRAVENOUS | Status: AC
  Administered 2013-10-08: 500 mL via INTRAVENOUS

## 2013-10-08 NOTE — Progress Notes (Signed)
PULMONARY / CRITICAL CARE MEDICINE   Name: William Stark MRN: [id number] DOB: 1945/06/27    ADMISSION DATE:  09/30/2013 CONSULTATION DATE:  10/08/2013  REFERRING MD :  EDP  CHIEF COMPLAINT:  Fever  INITIAL PRESENTATION: 68 y.o. M, nursing home resident [facility]) brought to [facility] ED on 9/23 with fever x 1 day to 101.5.  In ED, developed respiratory failure, failed BiPAP and required intubation.  PCCM was consulted for admission.   RUQ U/S 9/24 > normal CBD, no gallbladder  SIGNIFICANT EVENTS: 9/23 - intubated while in ED (by CCM), admitted to ICU. RUL PNA on CXR 9/23 - family wants limited code, no HD 9/24 > LVEF 55-60%, wall motion abnormalities 9/25: Staph in sputum, GPC in blood, minimal UOP, hyper k,  10/07/13:  Patient now full no code blue without desire for escalation - per RN ok for pressors but NO CPR and no HD. GPC in blood +. On levophed and vaso   SUBJECTIVE/OVERNIGHT/INTERVAL HX 10/08/13: Improving ur Op 450cc today. Still on bicarb gtt. Agittedon WUA - on fent gtt. Still on levophed 76mcg. RN concerned about purulence from right ground CVL area   VITAL SIGNS: Temp:  [97.2 F (36.2 C)-97.6 F (36.4 C)] 97.4 F (36.3 C) (09/27 1534) Pulse Rate:  [69-93] 74 (09/27 1700) Resp:  [10-21] 11 (09/27 1700) BP: (97-138)/(57-93) 111/67 mmHg (09/27 1700) SpO2:  [97 %-100 %] 100 % (09/27 1700) FiO2 (%):  [30 %] 30 % (09/27 1509) Weight:  [99.6 kg (219 lb 9.3 oz)] 99.6 kg (219 lb 9.3 oz) (09/27 0700) HEMODYNAMICS:   VENTILATOR SETTINGS: Vent Mode:  [-] PRVC FiO2 (%):  [30 %] 30 % Set Rate:  [12 bmp] 12 bmp Vt Set:  [630 mL] 630 mL PEEP:  [5 cmH20] 5 cmH20 Plateau Pressure:  [15 cmH20-19 cmH20] 17 cmH20 INTAKE / OUTPUT: Intake/Output     09/26 0701 - 09/27 0700 09/27 0701 - 09/28 0700   I.V. (mL/kg) 2420 (24.3) 923.7 (9.3)   NG/GT 622.2 450   IV Piggyback     Total Intake(mL/kg) 3042.2 (30.5) 1373.7 (13.8)   Urine (mL/kg/hr) 1220 (0.5) 450 (0.4)   Total  Output 1220 450   Net +1822.2 +923.7          PHYSICAL EXAMINATION: Gen: sedated on vent HEENT: NCAT, EOMi PULM: crackles upper lobes, otherwise clear CV: Tachy, regular AB: BS+, soft, no guarding or rebound Ext: warm, massive edema all over Neuro: RASS -2 heavily on vent. Agitated on WUA  LABS: PULMONARY  Recent Labs Lab 10/08/2013 1444 09/30/2013 1629 10/05/13 0743 10/06/13 0858  PHART 7.201* 7.199* 7.152* 7.156*  PCO2ART 41.6 43.4 44.6 43.8  PO2ART 67.0* 417.0* 113.0* 122.0*  HCO3 16.3* 16.8* 15.2* 15.1*  TCO2 $Rem'18 18 16 'gQcq$ 16.5  O2SAT 88.0 100.0 96.0 97.1    CBC  Recent Labs Lab 10/06/13 0500 10/07/13 0600 10/08/13 0520  HGB 7.9* 8.0* 7.7*  HCT 23.9* 22.4* 21.7*  WBC 43.8* 39.6* 29.7*  PLT 296 181 139*    COAGULATION  Recent Labs Lab 09/23/2013 1900 10/06/13 1115 10/07/13 1305  INR 6.04* 2.25* 1.77*    CARDIAC    Recent Labs Lab 10/08/2013 1900 10/05/13 0200 10/05/13 0800  TROPONINI <0.30 <0.30 <0.30    Recent Labs Lab 09/16/2013 1135  PROBNP 924.9*      CHEMISTRY  Recent Labs Lab 10/05/2013 1900 10/05/13 0830 10/06/13 0500 10/06/13 1332 10/07/13 0600  NA 141 139 140 137 138  K 4.7 5.6* 5.5* 5.0 5.5*  CL 113* 113* 110 109 103  CO2 15* 15* 15* 14* 17*  GLUCOSE 141* 169* 117* 155* 194*  BUN 38* 46* 59* 58* 71*  CREATININE 2.73* 3.49* 4.19* 3.96* 4.69*  CALCIUM 6.9* 6.5* 6.2* 5.8* 5.9*  MG 1.8  --   --   --   --   PHOS 6.7*  --   --   --   --    Estimated Creatinine Clearance: 18.4 ml/min (by C-G formula based on Cr of 4.69).   LIVER  Recent Labs Lab 09/22/2013 1135 09/24/2013 1900 10/06/13 1115 10/07/13 1305  AST 111* 118*  --   --   ALT 156* 147*  --   --   ALKPHOS 368* 414*  --   --   BILITOT 1.4* 1.5*  --   --   PROT 6.3 5.9*  --   --   ALBUMIN 1.8* 1.6*  --   --   INR  --  6.04* 2.25* 1.77*     INFECTIOUS  Recent Labs Lab 09/30/2013 1145 10/05/2013 1900 10/05/13 0400 10/05/13 0806 10/06/13 0500  LATICACIDVEN 2.79*  3.2*  --  2.6*  --   PROCALCITON  --  26.82 46.12  --  39.52     ENDOCRINE CBG (last 3)   Recent Labs  10/08/13 0431 10/08/13 0737 10/08/13 1458  GLUCAP 249* 292* 119*         IMAGING x48h Dg Chest Port 1 View  10/07/2013   CLINICAL DATA:  Intubation.  EXAM: PORTABLE CHEST - 1 VIEW  COMPARISON:  10/06/2013.  10/05/2013.  FINDINGS: Endotracheal tube and NG tube in stable position. Continued improvement of right upper lobe density suggesting improving pneumonia. Continued follow-up chest x-rays recommended demonstrate complete clearing. Persistent left lower lobe centrum atelectasis versus infiltrate. No prominent pleural effusion. No pneumothorax. Heart size stable. Pulmonary vascularity normal.  IMPRESSION: 1. Line and tubes in stable position.  2. Continued improvement of right upper lobe density suggesting improving pneumonia. Close follow-up chest x-ray is recommended to demonstrate complete clearing.  3. Left base subsegmental atelectasis and or mild infiltrate again noted.   Electronically Signed   By: Marcello Moores  Register   On: 10/07/2013 09:27        ASSESSMENT / PLAN:  PULMONARY OETT 9/23 >>> A: Acute hypoxemic respiratory failure MRSA v  MSSA  HCAP   - does not meet sbt criteria due to shock and agitation  P:   Full mechanical support, wean as able. VAP bundle. SBT if/when pressors weaned   CARDIOVASCULAR CVL R IJ 9/23 placed by ED >>>9/23 (pulled out by patient) CVL R Fem 9/23>> A Line R Fem 9/23 >>>9/25 (pulled out by patient)  Hx of HT  A:  Septic Shock - currently on levophed 21mcg DVT   left posterior tibial vein - per LE dopplers 9/10.   - improving pressor need. Off vasopressin P:  FLuid bolus x 1 to help get off pressors Levophed gtt, neo gtt, vasopression > titrated to MAP > 65 hydrocortisone for stress dose steroids Heparin gtt on hold due to high INR;; recheck 10/09/13 to decide on restart See discussion below  RENAL A:   ATN  - with  severe acidosis, patient/family does not want hemodialysis: officially ATN on 10/07/13. Family rejected HD   - Ur OP picking up  P:   Reduce  Bicarb gtt to kvo -> recheck abg and decide on dc bicarb BMP in AM. Stay away from vanc in renal faillure  GASTROINTESTINAL  A:   Nutrition GI prophylaxis Elevated alk phos/transaminasis, history of cholestectomy> normal RUQ U/S 9/24 P:   NPO. Nutrition consult for TF's > start today 10/07/13 SUP: Famotidine.  HEMATOLOGIC A:   VTE Prophylaxis DVT left posterior tibial vein - per LE dopplers 9/10.   - high INR 9/26 and heparing gtt on hold P:  CBC in AM. Hold outpatient Xarelto and inpatient Heparin gtt Repeat INR 10/09/13 and decide  INFECTIOUS 09/16/2013 - MRSA PCR - positive Blood culture 9/23 - STaph aureus - MSSA Urine 9/23 - negative final REsp 9/23 - abundant staph aureus - MSSA Resp 9/24 - few staph - pending  A:   Septic Shock -severe, secondary to MSSA pneumonia  P:   Stop Zosyn Start Imipenem 9/24 >>9/25 Start diflucan 9/24 >>9/25 Vanc, start date 9/23, > 9/26 Start ANCEF 9/27 for MSSA >> Draw f/u blood cultures if speciation is staph, may need TEE if survives  ENDOCRINE A:   DM P:   ICU hyperglycemia protocol phase 1.  NEUROLOGIC A:   Acute metabolic encephalopathy P:   Sedation:  Fentanyl gtt. RASS goal: 0 to -1. Daily WUA.   FAMILY 10/08/13: None updated. Per RN they are asking for inter-disciplinary family meet 10/09/13  TODAY'S SUMMARY: 68 y/o male admitted from nursing home with refractory septic shock and AKI due to Staph pneumonia. See lengthy discussion regarding code status.  Overall poor prognosis.  Strategy is to continue with pressors and full support otherwise but hold off on further invasive procedures and no HD.  Aim goals of care 10/09/13   I have personally obtained a history, examined the patient, evaluated laboratory and imaging results, formulated the assessment and plan and placed  orders. CRITICAL CARE: The patient is critically ill with multiple organ systems failure and requires high complexity decision making for assessment and support, frequent evaluation and titration of therapies, application of advanced monitoring technologies and extensive interpretation of multiple databases. Critical Care Time devoted to patient care services described in this note is 31 minutes.    Dr. Brand Males, M.D., [facility].C.P Pulmonary and Critical Care Medicine Staff Physician [place] [facility] Pager: [phone], If no answer or between  15:00h - 7:00h: call [phone]  10/08/2013 5:33 PM

## 2013-10-08 NOTE — Progress Notes (Signed)
ANTIBIOTIC CONSULT NOTE - INITIAL  Pharmacy Consult for Ancef Indication: MSSA bacteremia and pneumonia  No Known Allergies  Patient Measurements: Height: 6' (182.9 cm) Weight: 219 lb 9.3 oz (99.6 kg) IBW/kg (Calculated) : 77.6 Vital Signs: Temp: 97.2 F (36.2 C) (09/27 1157) Temp src: Oral (09/27 1157) BP: 118/71 mmHg (09/27 1300) Pulse Rate: 73 (09/27 1400) Intake/Output from previous day: 09/26 0701 - 09/27 0700 In: 3042.2 [I.V.:2420; NG/GT:622.2] Out: 1220 [Urine:1220] Intake/Output from this shift: Total I/O In: 820.5 [I.V.:490.5; NG/GT:330] Out: -   Labs:  Recent Labs  10/06/13 0500 10/06/13 1332 10/07/13 0600 10/08/13 0520  WBC 43.8*  --  39.6* 29.7*  HGB 7.9*  --  8.0* 7.7*  PLT 296  --  181 139*  CREATININE 4.19* 3.96* 4.69*  --    Estimated Creatinine Clearance: 18.4 ml/min (by C-G formula based on Cr of 4.69).  Recent Labs  10/06/13 1115 10/07/13 1305  VANCOTROUGH 20.7* 17.0     Microbiology: Recent Results (from the past 720 hour(s))  CULTURE, BLOOD (ROUTINE X 2)     Status: None   Collection Time    09/15/13 10:15 AM      Result Value Ref Range Status   Specimen Description BLOOD LEFT HAND   Final   Special Requests BOTTLES DRAWN AEROBIC AND ANAEROBIC 10CC   Final   Culture  Setup Time     Final   Value: 09/15/2013 14:27     Performed at [facility]   Culture     Final   Value: NO GROWTH 5 DAYS     Performed at [facility]   Report Status 09/21/2013 FINAL   Final  CULTURE, BLOOD (ROUTINE X 2)     Status: None   Collection Time    09/15/13 10:30 AM      Result Value Ref Range Status   Specimen Description BLOOD LEFT HAND   Final   Special Requests BOTTLES DRAWN AEROBIC AND ANAEROBIC 10CC   Final   Culture  Setup Time     Final   Value: 09/15/2013 14:28     Performed at [facility]   Culture     Final   Value: NO GROWTH 5 DAYS     Performed at [facility]   Report Status 09/21/2013 FINAL   Final   MRSA PCR SCREENING     Status: None   Collection Time    09/15/13  4:45 PM      Result Value Ref Range Status   MRSA by PCR NEGATIVE  NEGATIVE Final   Comment:            The GeneXpert MRSA Assay (FDA     approved for NASAL specimens     only), is one component of a     comprehensive MRSA colonization     surveillance program. It is not     intended to diagnose MRSA     infection nor to guide or     monitor treatment for     MRSA infections.  CLOSTRIDIUM DIFFICILE BY PCR     Status: None   Collection Time    09/19/13  8:27 AM      Result Value Ref Range Status   C difficile by pcr NEGATIVE  NEGATIVE Final  STOOL CULTURE     Status: None   Collection Time    09/20/13  5:04 PM      Result Value Ref Range Status   Specimen  Description STOOL   Final   Special Requests NONE   Final   Culture     Final   Value: NO SALMONELLA, SHIGELLA, CAMPYLOBACTER, YERSINIA, OR E.COLI 0157:H7 ISOLATED     Note: REDUCED NORMAL FLORA PRESENT     Performed at [facility]   Report Status 09/24/2013 FINAL   Final  CULTURE, EXPECTORATED SPUTUM-ASSESSMENT     Status: None   Collection Time    09/21/13 11:41 AM      Result Value Ref Range Status   Specimen Description SPUTUM   Final   Special Requests NONE   Final   Sputum evaluation     Final   Value: THIS SPECIMEN IS ACCEPTABLE. RESPIRATORY CULTURE REPORT TO FOLLOW.   Report Status 09/21/2013 FINAL   Final  CULTURE, RESPIRATORY (NON-EXPECTORATED)     Status: None   Collection Time    09/21/13 11:41 AM      Result Value Ref Range Status   Specimen Description SPUTUM   Final   Special Requests NONE   Final   Gram Stain     Final   Value: RARE WBC PRESENT,BOTH PMN AND MONONUCLEAR     RARE SQUAMOUS EPITHELIAL CELLS PRESENT     NO ORGANISMS SEEN     Performed at [facility]   Culture     Final   Value: FEW CANDIDA ALBICANS     Performed at [facility]   Report Status 09/23/2013 FINAL   Final  CULTURE, BLOOD  (ROUTINE X 2)     Status: None   Collection Time    09/21/13  3:54 PM      Result Value Ref Range Status   Specimen Description BLOOD RIGHT ARM   Final   Special Requests BOTTLES DRAWN AEROBIC AND ANAEROBIC 10CC   Final   Culture  Setup Time     Final   Value: 09/21/2013 19:21     Performed at [facility]   Culture     Final   Value: NO GROWTH 5 DAYS     Performed at [facility]   Report Status 09/27/2013 FINAL   Final  CULTURE, BLOOD (ROUTINE X 2)     Status: None   Collection Time    09/21/13  4:05 PM      Result Value Ref Range Status   Specimen Description BLOOD RIGHT HAND   Final   Special Requests BOTTLES DRAWN AEROBIC ONLY 8CC   Final   Culture  Setup Time     Final   Value: 09/21/2013 19:18     Performed at [facility]   Culture     Final   Value: NO GROWTH 5 DAYS     Performed at [facility]   Report Status 09/27/2013 FINAL   Final  CSF CULTURE     Status: None   Collection Time    09/21/13  4:41 PM      Result Value Ref Range Status   Specimen Description CSF   Final   Special Requests 1.3ML CSF FLUID NO 2   Final   Gram Stain     Final   Value: WBC PRESENT, PREDOMINANTLY MONONUCLEAR     NO ORGANISMS SEEN     CYTOSPIN Performed at [facility]     Performed at [facility]   Culture     Final   Value: NO GROWTH 3 DAYS     Performed at [facility]   Report Status 09/24/2013 FINAL   Final  GRAM STAIN     Status: None   Collection Time    09/21/13  4:41 PM      Result Value Ref Range Status   Specimen Description CSF   Final   Special Requests 1.3ML CSF FLUID   Final   Gram Stain     Final   Value: WBC PRESENT, PREDOMINANTLY MONONUCLEAR     NO ORGANISMS SEEN     CYTOSPIN SLIDE   Report Status 09/21/2013 FINAL   Final  AFB CULTURE WITH SMEAR     Status: None   Collection Time    09/21/13  4:43 PM      Result Value Ref Range Status   Specimen Description CSF   Final   Special Requests 1.3ML CSF  FLUID   Final   Acid Fast Smear     Final   Value: NO ACID FAST BACILLI SEEN     Performed at [facility]   Culture     Final   Value: CULTURE WILL BE EXAMINED FOR 6 WEEKS BEFORE ISSUING A FINAL REPORT     Performed at [facility]   Report Status PENDING   Incomplete  URINE CULTURE     Status: None   Collection Time    09/25/13 11:39 AM      Result Value Ref Range Status   Specimen Description URINE, CLEAN CATCH   Final   Special Requests NONE   Final   Culture  Setup Time     Final   Value: 09/25/2013 15:01     Performed at [place]     Final   Value: 35,000 COLONIES/ML     Performed at [facility]   Culture     Final   Value: STAPHYLOCOCCUS SPECIES (COAGULASE NEGATIVE)     Note: RIFAMPIN AND GENTAMICIN SHOULD NOT BE USED AS SINGLE DRUGS FOR TREATMENT OF STAPH INFECTIONS.     Performed at [facility]   Report Status 09/29/2013 FINAL   Final   Organism ID, Bacteria STAPHYLOCOCCUS SPECIES (COAGULASE NEGATIVE)   Final  CULTURE, BLOOD (ROUTINE X 2)     Status: None   Collection Time    09/25/13  1:50 PM      Result Value Ref Range Status   Specimen Description BLOOD LEFT HAND   Final   Special Requests BOTTLES DRAWN AEROBIC AND ANAEROBIC 10CC   Final   Culture  Setup Time     Final   Value: 09/25/2013 17:11     Performed at [facility]   Culture     Final   Value: NO GROWTH 5 DAYS     Performed at [facility]   Report Status 10/01/2013 FINAL   Final  CULTURE, BLOOD (ROUTINE X 2)     Status: None   Collection Time    09/25/13  1:55 PM      Result Value Ref Range Status   Specimen Description BLOOD LEFT ARM   Final   Special Requests     Final   Value: BOTTLES DRAWN AEROBIC AND ANAEROBIC 10CC BLUE, 5CC RED   Culture  Setup Time     Final   Value: 09/25/2013 17:11     Performed at [facility]   Culture     Final   Value: NO GROWTH 5 DAYS     Performed at [facility]    Report Status 10/01/2013 FINAL   Final  CULTURE, BLOOD (ROUTINE X 2)     Status: None   Collection Time    10/03/2013 12:00 PM      Result Value Ref Range Status   Specimen Description BLOOD RIGHT ANTECUBITAL   Final   Special Requests BOTTLES DRAWN AEROBIC AND ANAEROBIC 6MLS   Final   Culture  Setup Time     Final   Value: 10/10/2013 22:36     Performed at [facility]   Culture     Final   Value: STAPHYLOCOCCUS AUREUS     Note: SUSCEPTIBILITIES PERFORMED ON PREVIOUS CULTURE WITHIN THE LAST 5 DAYS.     Note: Gram Stain Report Called to,Read Back By and Verified With: Otelia Santee RN ON 9.25.15 AT 1915 BY HENDJ     Performed at [facility]   Report Status 10/08/2013 FINAL   Final  CULTURE, BLOOD (ROUTINE X 2)     Status: None   Collection Time    10/10/2013  1:20 PM      Result Value Ref Range Status   Specimen Description BLOOD PICC LINE   Final   Special Requests BOTTLES DRAWN AEROBIC AND ANAEROBIC 6CC RIGHT IJ   Final   Culture  Setup Time     Final   Value: 09/25/2013 23:53     Performed at [facility]   Culture     Final   Value: STAPHYLOCOCCUS AUREUS     Note: RIFAMPIN AND GENTAMICIN SHOULD NOT BE USED AS SINGLE DRUGS FOR TREATMENT OF STAPH INFECTIONS.     Note: Gram Stain Report Called to,Read Back By and Verified With: JENNIFER CLIFTON 10/06/13 3:35AM [place]     Performed at [facility]   Report Status 10/08/2013 FINAL   Final   Organism ID, Bacteria STAPHYLOCOCCUS AUREUS   Final  CULTURE, RESPIRATORY (NON-EXPECTORATED)     Status: None   Collection Time    09/13/2013  3:25 PM      Result Value Ref Range Status   Specimen Description ENDOTRACHEAL   Final   Special Requests NONE   Final   Gram Stain     Final   Value: ABUNDANT WBC PRESENT,BOTH PMN AND MONONUCLEAR     NO SQUAMOUS EPITHELIAL CELLS SEEN     ABUNDANT GRAM POSITIVE COCCI     IN PAIRS IN CLUSTERS     Performed at [facility]   Culture     Final   Value: ABUNDANT  STAPHYLOCOCCUS AUREUS     Note: RIFAMPIN AND GENTAMICIN SHOULD NOT BE USED AS SINGLE DRUGS FOR TREATMENT OF STAPH INFECTIONS.     MODERATE GRAM NEGATIVE RODS     ABUNDANT STREPTOCOCCUS,BETA HEMOLYTIC NOT GROUP A     Performed at [facility]   Report Status PENDING   Incomplete   Organism ID, Bacteria STAPHYLOCOCCUS AUREUS   Final  MRSA PCR SCREENING     Status: Abnormal   Collection Time    10/03/2013  5:05 PM      Result Value Ref Range Status   MRSA by PCR POSITIVE (*) NEGATIVE Final   Comment:            The GeneXpert MRSA Assay (FDA     approved for NASAL specimens     only), is one component of a     comprehensive MRSA colonization     surveillance program. It is not  intended to diagnose MRSA     infection nor to guide or     monitor treatment for     MRSA infections.     RESULT CALLED TO, READ BACK BY AND VERIFIED WITH:     C.[place]KEOWN,RN AT [facility] 10/10/2013  URINE CULTURE     Status: None   Collection Time    10/02/2013  7:42 PM      Result Value Ref Range Status   Specimen Description URINE, CATHETERIZED   Final   Special Requests NONE   Final   Culture  Setup Time     Final   Value: 09/27/2013 20:15     Performed at [facility]     Final   Value: NO GROWTH     Performed at [facility]   Culture     Final   Value: NO GROWTH     Performed at [facility]   Report Status 10/06/2013 FINAL   Final  CULTURE, RESPIRATORY (NON-EXPECTORATED)     Status: None   Collection Time    10/05/13  4:54 PM      Result Value Ref Range Status   Specimen Description TRACHEAL ASPIRATE   Final   Special Requests NONE   Final   Gram Stain     Final   Value: ABUNDANT WBC PRESENT,BOTH PMN AND MONONUCLEAR     RARE SQUAMOUS EPITHELIAL CELLS PRESENT     FEW GRAM POSITIVE COCCI     IN PAIRS IN CLUSTERS     Performed at [facility]   Culture     Final   Value: FEW STAPHYLOCOCCUS AUREUS     Note: RIFAMPIN AND GENTAMICIN  SHOULD NOT BE USED AS SINGLE DRUGS FOR TREATMENT OF STAPH INFECTIONS.     Performed at [facility]   Report Status 10/08/2013 FINAL   Final   Organism ID, Bacteria STAPHYLOCOCCUS AUREUS   Final    Medical History: Past Medical History  Diagnosis Date  . Hypertension   . Diabetes mellitus without complication   . Cancer    Assessment: 71 YOM with MSSA pneumonia and bacteremia to start IV Ancef. WBC is trending down slowly at 29.7. SCr continues to climb (last check at 4.69). No HD per family.  PCT 46. 12>>39.52. UOP ~0.5 cc/kg/hr.  Goal of Therapy:  Clinical resolution of infection.   Plan:  1. Ancef 1g IV q24h.  2. Monitor clinical status and need for repeat cultures.  Sloan Leiter, PharmD, BCPS Clinical Pharmacist [phone] 10/08/2013,2:20 PM

## 2013-10-08 NOTE — Progress Notes (Signed)
Venous gas obtained and thought it was arterial until after resulting it. RN aware.

## 2013-10-09 ENCOUNTER — Inpatient Hospital Stay (HOSPITAL_COMMUNITY): Payer: Medicare Other

## 2013-10-09 DIAGNOSIS — J15211 Pneumonia due to Methicillin susceptible Staphylococcus aureus: Secondary | ICD-10-CM

## 2013-10-09 DIAGNOSIS — R7881 Bacteremia: Secondary | ICD-10-CM

## 2013-10-09 DIAGNOSIS — R6520 Severe sepsis without septic shock: Secondary | ICD-10-CM

## 2013-10-09 DIAGNOSIS — A419 Sepsis, unspecified organism: Secondary | ICD-10-CM

## 2013-10-09 DIAGNOSIS — A4901 Methicillin susceptible Staphylococcus aureus infection, unspecified site: Secondary | ICD-10-CM

## 2013-10-09 DIAGNOSIS — K759 Inflammatory liver disease, unspecified: Secondary | ICD-10-CM

## 2013-10-09 LAB — BLOOD GAS, ARTERIAL
ACID-BASE DEFICIT: 2.1 mmol/L — AB (ref 0.0–2.0)
Bicarbonate: 22.5 mEq/L (ref 20.0–24.0)
DRAWN BY: 252031
FIO2: 0.3 %
LHR: 12 {breaths}/min
O2 SAT: 93.2 %
PATIENT TEMPERATURE: 98.6
PEEP: 5 cmH2O
TCO2: 23.7 mmol/L (ref 0–100)
VT: 630 mL
pCO2 arterial: 40.5 mmHg (ref 35.0–45.0)
pH, Arterial: 7.363 (ref 7.350–7.450)
pO2, Arterial: 84.4 mmHg (ref 80.0–100.0)

## 2013-10-09 LAB — CBC WITH DIFFERENTIAL/PLATELET
BASOS ABS: 0 10*3/uL (ref 0.0–0.1)
Basophils Relative: 0 % (ref 0–1)
Eosinophils Absolute: 0 10*3/uL (ref 0.0–0.7)
Eosinophils Relative: 0 % (ref 0–5)
HCT: 22.6 % — ABNORMAL LOW (ref 39.0–52.0)
Hemoglobin: 8 g/dL — ABNORMAL LOW (ref 13.0–17.0)
LYMPHS ABS: 0.8 10*3/uL (ref 0.7–4.0)
LYMPHS PCT: 3 % — AB (ref 12–46)
MCH: 29.5 pg (ref 26.0–34.0)
MCHC: 35.4 g/dL (ref 30.0–36.0)
MCV: 83.4 fL (ref 78.0–100.0)
Monocytes Absolute: 0.8 10*3/uL (ref 0.1–1.0)
Monocytes Relative: 3 % (ref 3–12)
Neutro Abs: 25.8 10*3/uL — ABNORMAL HIGH (ref 1.7–7.7)
Neutrophils Relative %: 94 % — ABNORMAL HIGH (ref 43–77)
PLATELETS: 113 10*3/uL — AB (ref 150–400)
RBC: 2.71 MIL/uL — ABNORMAL LOW (ref 4.22–5.81)
RDW: 17.5 % — AB (ref 11.5–15.5)
WBC: 27.4 10*3/uL — AB (ref 4.0–10.5)

## 2013-10-09 LAB — PROTIME-INR
INR: 1.67 — ABNORMAL HIGH (ref 0.00–1.49)
Prothrombin Time: 19.7 seconds — ABNORMAL HIGH (ref 11.6–15.2)

## 2013-10-09 LAB — GLUCOSE, CAPILLARY
GLUCOSE-CAPILLARY: 160 mg/dL — AB (ref 70–99)
GLUCOSE-CAPILLARY: 100 mg/dL — AB (ref 70–99)
GLUCOSE-CAPILLARY: 202 mg/dL — AB (ref 70–99)
GLUCOSE-CAPILLARY: 222 mg/dL — AB (ref 70–99)
Glucose-Capillary: 123 mg/dL — ABNORMAL HIGH (ref 70–99)
Glucose-Capillary: 149 mg/dL — ABNORMAL HIGH (ref 70–99)
Glucose-Capillary: 179 mg/dL — ABNORMAL HIGH (ref 70–99)
Glucose-Capillary: 195 mg/dL — ABNORMAL HIGH (ref 70–99)
Glucose-Capillary: 213 mg/dL — ABNORMAL HIGH (ref 70–99)
Glucose-Capillary: 223 mg/dL — ABNORMAL HIGH (ref 70–99)

## 2013-10-09 LAB — BASIC METABOLIC PANEL
Anion gap: 15 (ref 5–15)
BUN: 79 mg/dL — AB (ref 6–23)
CO2: 24 mEq/L (ref 19–32)
Calcium: 5.9 mg/dL — CL (ref 8.4–10.5)
Chloride: 102 mEq/L (ref 96–112)
Creatinine, Ser: 4.79 mg/dL — ABNORMAL HIGH (ref 0.50–1.35)
GFR, EST AFRICAN AMERICAN: 13 mL/min — AB (ref >90)
GFR, EST NON AFRICAN AMERICAN: 11 mL/min — AB (ref >90)
Glucose, Bld: 226 mg/dL — ABNORMAL HIGH (ref 70–99)
POTASSIUM: 4.2 meq/L (ref 3.7–5.3)
SODIUM: 141 meq/L (ref 137–147)

## 2013-10-09 LAB — CULTURE, RESPIRATORY W GRAM STAIN

## 2013-10-09 LAB — CULTURE, RESPIRATORY

## 2013-10-09 LAB — MAGNESIUM: Magnesium: 2.1 mg/dL (ref 1.5–2.5)

## 2013-10-09 LAB — HEPATITIS PANEL, ACUTE
HCV Ab: NEGATIVE
Hep A IgM: NONREACTIVE
Hep B C IgM: NONREACTIVE
Hepatitis B Surface Ag: NEGATIVE

## 2013-10-09 LAB — PHOSPHORUS: PHOSPHORUS: 8 mg/dL — AB (ref 2.3–4.6)

## 2013-10-09 MED ORDER — CEFAZOLIN SODIUM 1-5 GM-% IV SOLN
1.0000 g | Freq: Two times a day (BID) | INTRAVENOUS | Status: DC
  Administered 2013-10-09 – 2013-10-10 (×2): 1 g via INTRAVENOUS
  Filled 2013-10-09 (×3): qty 50

## 2013-10-09 MED ORDER — FAMOTIDINE 40 MG/5ML PO SUSR
20.0000 mg | Freq: Every day | ORAL | Status: DC
  Administered 2013-10-09: 20 mg
  Filled 2013-10-09 (×2): qty 2.5

## 2013-10-09 MED ORDER — CALCIUM GLUCONATE 10 % IV SOLN
1.0000 g | Freq: Once | INTRAVENOUS | Status: AC
  Administered 2013-10-09: 1 g via INTRAVENOUS
  Filled 2013-10-09: qty 10

## 2013-10-09 NOTE — Progress Notes (Signed)
P[facility] [place] Progress Note  Following insertion of left IJ central line, CXR demonstrated that tip of catheter was directed down the left subclavian vein and towards the eft axilla / upper extremity.  Catheter was therefore pulled back 9 - 10 cm, follow up CXR demonstrates that it now lies in the left subclavian vein.  After speaking with Dr. Titus Mould and confirming that line will likely not be used for pressors, should be OK to leave as is.  Will confirm with Dr. Lamonte Sakai in [place].  Montey Hora, [facility] Pgr: [phone]  or [phone]

## 2013-10-09 NOTE — Procedures (Signed)
D/w wife, son We discussed his prior med problems. His wishes were clear to not have trach. We have weaned well and are approaching best chance at extubation. Family will visit. And extubate at 4 pm with DNI then comfort if fails: continued support if resp status is good Lavon Paganini. Titus Mould, MD, [place] Pgr: [facility] '

## 2013-10-09 NOTE — Consult Note (Signed)
[facility] for Infectious Disease  Date of Admission:  09/23/2013  Date of Consult:  10/09/2013  Reason for Consult: MRSA bacteremia Referring Physician: CHAMP  Impression/Recommendation MSSA bacteremia MSSA pneumonia Hepatitis (sepsis related?) family meeting pending  Would Repeat his BCx Await family meeting to decide if aggressive w/u warranted (for IE) continue ancef, increase dose to q12 Check Hep A/B/C   Thank you so much for this interesting consult,   Bobby Rumpf (pager) [phone] [email or web address]  LANGSTON TUBERVILLE is an 68 y.o. male.  HPI: 68 yo M adm from SNF on 9-23 with fever and respiratory failure. He required intubation. He was found to have RUL pna on his CXR, WBC 31.  He was felt to be septic, was started on vanco/zosyn. He has required pressor support.  By 9-25 his BCx were noted to be Staph aureus. He has received, vanco, linezolid, ancef for this.  His CXR has shown improvement in his RUL infiltrate.   He was previously adm 9-4 with mental status change and cellulitis. He had extensive w/u, was d/c home on 9-17.   Past Medical History  Diagnosis Date  . Hypertension   . Diabetes mellitus without complication   . Cancer     Past Surgical History  Procedure Laterality Date  . Colon surgery    . Appendectomy    . Tonsillectomy    . Cholecystectomy       No Known Allergies  Medications:  Scheduled: . antiseptic oral rinse  7 mL Mouth Rinse QID  .  ceFAZolin (ANCEF) IV  1 g Intravenous Q24H  . chlorhexidine  15 mL Mouth Rinse BID  . famotidine  20 mg Per Tube Daily  . feeding supplement (VITAL HIGH PROTEIN)  1,000 mL Per Tube Q24H  . fentaNYL  50 mcg Intravenous Once  . hydrocortisone sodium succinate  50 mg Intravenous 4 times per day  . insulin aspart  0-15 Units Subcutaneous 6 times per day    Abtx:  Anti-infectives   Start     Dose/Rate Route Frequency Ordered Stop   10/08/13 1430  ceFAZolin (ANCEF) IVPB 1 g/50  mL premix     1 g 100 mL/hr over 30 Minutes Intravenous Every 24 hours 10/08/13 1419     10/07/13 2200  linezolid (ZYVOX) IVPB 600 mg  Status:  Discontinued     600 mg 300 mL/hr over 60 Minutes Intravenous Every 12 hours 10/07/13 1606 10/07/13 1606   10/07/13 1430  vancomycin (VANCOCIN) IVPB 1000 mg/200 mL premix  Status:  Discontinued     1,000 mg 200 mL/hr over 60 Minutes Intravenous  Once 10/07/13 1415 10/07/13 1606   10/05/13 1200  fluconazole (DIFLUCAN) IVPB 200 mg  Status:  Discontinued     200 mg 100 mL/hr over 60 Minutes Intravenous Every 24 hours 10/05/13 1048 10/06/13 0849   10/05/13 0900  imipenem-cilastatin (PRIMAXIN) 250 mg in sodium chloride 0.9 % 100 mL IVPB  Status:  Discontinued     250 mg 200 mL/hr over 30 Minutes Intravenous Every 6 hours 10/05/13 0813 10/06/13 0849   09/28/2013 2300  vancomycin (VANCOCIN) IVPB 750 mg/150 ml premix  Status:  Discontinued     750 mg 150 mL/hr over 60 Minutes Intravenous Every 12 hours 09/30/2013 1155 10/05/13 1312   09/15/2013 2300  vancomycin (VANCOCIN) IVPB 750 mg/150 ml premix  Status:  Discontinued     750 mg 150 mL/hr over 60 Minutes Intravenous Every 12 hours 10/06/2013 1155 09/19/2013 1157  09/12/2013 2300  ceFEPIme (MAXIPIME) 1 g in dextrose 5 % 50 mL IVPB  Status:  Discontinued     1 g 100 mL/hr over 30 Minutes Intravenous Every 12 hours 09/25/2013 1157 09/14/2013 1610   09/21/2013 2200  piperacillin-tazobactam (ZOSYN) IVPB 3.375 g  Status:  Discontinued     3.375 g 12.5 mL/hr over 240 Minutes Intravenous 3 times per day 09/21/2013 1611 10/05/13 0811   09/16/2013 1145  ceFEPIme (MAXIPIME) 2 g in dextrose 5 % 50 mL IVPB     2 g 100 mL/hr over 30 Minutes Intravenous  Once 09/25/2013 1142 09/23/2013 1447   09/20/2013 1145  vancomycin (VANCOCIN) IVPB 1000 mg/200 mL premix     1,000 mg 200 mL/hr over 60 Minutes Intravenous  Once 09/24/2013 1142 09/29/2013 1430      Total days of antibiotics: 6 (ancef)          Social History:  reports that he has been  smoking.  He does not have any smokeless tobacco history on file. He reports that he does not drink alcohol or use illicit drugs.  No family history on file.  General ROS: per nurse- occas resp secretionss, normal BM today, see HPI.   Blood pressure 78/55, pulse 91, temperature 97.4 F (36.3 C), temperature source Axillary, resp. rate 11, height 6' (1.829 m), weight 100.4 kg (221 lb 5.5 oz), SpO2 100.00%. General appearance: agitated Eyes: negative findings: pupils = Throat: ET and OGT Neck: no adenopathy and supple, symmetrical, trachea midline Lungs: rhonchi bilaterally Heart: regular rate and rhythm Abdomen: normal findings: bowel sounds normal and soft, non-tender Extremities: edema anasarca. extr are warm, skin peeling.    Results for orders placed during the hospital encounter of 09/23/2013 (from the past 48 hour(s))  GLUCOSE, CAPILLARY     Status: Abnormal   Collection Time    10/07/13 11:46 AM      Result Value Ref Range   Glucose-Capillary 128 (*) 70 - 99 mg/dL  PROTIME-INR     Status: Abnormal   Collection Time    10/07/13  1:05 PM      Result Value Ref Range   Prothrombin Time 20.6 (*) 11.6 - 15.2 seconds   INR 1.77 (*) 0.00 - 1.49  VANCOMYCIN, TROUGH     Status: None   Collection Time    10/07/13  1:05 PM      Result Value Ref Range   Vancomycin Tr 17.0  10.0 - 20.0 ug/mL  GLUCOSE, CAPILLARY     Status: None   Collection Time    10/07/13  3:41 PM      Result Value Ref Range   Glucose-Capillary 95  70 - 99 mg/dL  GLUCOSE, CAPILLARY     Status: Abnormal   Collection Time    10/08/13 12:26 AM      Result Value Ref Range   Glucose-Capillary 184 (*) 70 - 99 mg/dL   Comment 1 Notify RN    GLUCOSE, CAPILLARY     Status: Abnormal   Collection Time    10/08/13  4:31 AM      Result Value Ref Range   Glucose-Capillary 249 (*) 70 - 99 mg/dL   Comment 1 Notify RN    CBC     Status: Abnormal   Collection Time    10/08/13  5:20 AM      Result Value Ref Range   WBC  29.7 (*) 4.0 - 10.5 K/uL   RBC 2.49 (*) 4.22 - 5.81 MIL/uL  Hemoglobin 7.7 (*) 13.0 - 17.0 g/dL   HCT 21.7 (*) 39.0 - 52.0 %   MCV 87.1  78.0 - 100.0 fL   MCH 30.9  26.0 - 34.0 pg   MCHC 35.5  30.0 - 36.0 g/dL   RDW 17.7 (*) 11.5 - 15.5 %   Platelets 139 (*) 150 - 400 K/uL   Comment: DELTA CHECK NOTED     REPEATED TO VERIFY     SPECIMEN CHECKED FOR CLOTS  GLUCOSE, CAPILLARY     Status: Abnormal   Collection Time    10/08/13  7:37 AM      Result Value Ref Range   Glucose-Capillary 292 (*) 70 - 99 mg/dL  GLUCOSE, CAPILLARY     Status: Abnormal   Collection Time    10/08/13  2:58 PM      Result Value Ref Range   Glucose-Capillary 119 (*) 70 - 99 mg/dL  POCT I-STAT 3, ART BLOOD GAS (G3+)     Status: Abnormal   Collection Time    10/08/13  6:55 PM      Result Value Ref Range   pH, Arterial 7.354  7.350 - 7.450   pCO2 arterial 39.8  35.0 - 45.0 mmHg   pO2, Arterial 30.0 (*) 80.0 - 100.0 mmHg   Bicarbonate 22.3  20.0 - 24.0 mEq/L   TCO2 24  0 - 100 mmol/L   O2 Saturation 58.0     Acid-base deficit 3.0 (*) 0.0 - 2.0 mmol/L   Patient temperature 97.4 F     Collection site RADIAL, ALLEN'S TEST ACCEPTABLE     Drawn by Operator     Sample type ARTERIAL     Comment NOTIFIED PHYSICIAN    GLUCOSE, CAPILLARY     Status: Abnormal   Collection Time    10/09/13 12:29 AM      Result Value Ref Range   Glucose-Capillary 149 (*) 70 - 99 mg/dL   Comment 1 Notify RN    BLOOD GAS, ARTERIAL     Status: Abnormal   Collection Time    10/09/13  3:25 AM      Result Value Ref Range   FIO2 0.30     Delivery systems VENTILATOR     Mode PRESSURE REGULATED VOLUME CONTROL     VT 630     Rate 12     Peep/cpap 5.0     pH, Arterial 7.363  7.350 - 7.450   pCO2 arterial 40.5  35.0 - 45.0 mmHg   pO2, Arterial 84.4  80.0 - 100.0 mmHg   Bicarbonate 22.5  20.0 - 24.0 mEq/L   TCO2 23.7  0 - 100 mmol/L   Acid-base deficit 2.1 (*) 0.0 - 2.0 mmol/L   O2 Saturation 93.2     Patient temperature 98.6      Collection site LEFT RADIAL     Drawn by [id number]     Sample type ARTERIAL DRAW     Allens test (pass/fail) PASS  PASS  GLUCOSE, CAPILLARY     Status: Abnormal   Collection Time    10/09/13  3:44 AM      Result Value Ref Range   Glucose-Capillary 179 (*) 70 - 99 mg/dL   Comment 1 Notify RN    CBC WITH DIFFERENTIAL     Status: Abnormal   Collection Time    10/09/13  4:45 AM      Result Value Ref Range   WBC 27.4 (*) 4.0 - 10.5 K/uL  Comment: ADJUSTED FOR NUCLEATED RBC'S   RBC 2.71 (*) 4.22 - 5.81 MIL/uL   Hemoglobin 8.0 (*) 13.0 - 17.0 g/dL   HCT 22.6 (*) 39.0 - 52.0 %   MCV 83.4  78.0 - 100.0 fL   MCH 29.5  26.0 - 34.0 pg   MCHC 35.4  30.0 - 36.0 g/dL   RDW 17.5 (*) 11.5 - 15.5 %   Platelets 113 (*) 150 - 400 K/uL   Comment: SPECIMEN CHECKED FOR CLOTS     REPEATED TO VERIFY     PLATELET COUNT CONFIRMED BY SMEAR   Neutrophils Relative % 94 (*) 43 - 77 %   Lymphocytes Relative 3 (*) 12 - 46 %   Monocytes Relative 3  3 - 12 %   Eosinophils Relative 0  0 - 5 %   Basophils Relative 0  0 - 1 %   Neutro Abs 25.8 (*) 1.7 - 7.7 K/uL   Lymphs Abs 0.8  0.7 - 4.0 K/uL   Monocytes Absolute 0.8  0.1 - 1.0 K/uL   Eosinophils Absolute 0.0  0.0 - 0.7 K/uL   Basophils Absolute 0.0  0.0 - 0.1 K/uL   RBC Morphology BASOPHILIC STIPPLING     Comment: POLYCHROMASIA PRESENT     TARGET CELLS   WBC Morphology MILD LEFT SHIFT (1-5% METAS, OCC MYELO, OCC BANDS)    BASIC METABOLIC PANEL     Status: Abnormal   Collection Time    10/09/13  4:45 AM      Result Value Ref Range   Sodium 141  137 - 147 mEq/L   Potassium 4.2  3.7 - 5.3 mEq/L   Comment: DELTA CHECK NOTED   Chloride 102  96 - 112 mEq/L   CO2 24  19 - 32 mEq/L   Glucose, Bld 226 (*) 70 - 99 mg/dL   BUN 79 (*) 6 - 23 mg/dL   Creatinine, Ser 4.79 (*) 0.50 - 1.35 mg/dL   Calcium 5.9 (*) 8.4 - 10.5 mg/dL   Comment: CRITICAL RESULT CALLED TO, READ BACK BY AND VERIFIED WITH:     SHEPHARD,B RN 10/09/2013 0552 JORDANS     REPEATED TO VERIFY     GFR calc non Af Amer 11 (*) >90 mL/min   GFR calc Af Amer 13 (*) >90 mL/min   Comment: (NOTE)     The eGFR has been calculated using the CKD EPI equation.     This calculation has not been validated in all clinical situations.     eGFR's persistently <90 mL/min signify possible Chronic Kidney     Disease.   Anion gap 15  5 - 15  MAGNESIUM     Status: None   Collection Time    10/09/13  4:45 AM      Result Value Ref Range   Magnesium 2.1  1.5 - 2.5 mg/dL  PHOSPHORUS     Status: Abnormal   Collection Time    10/09/13  4:45 AM      Result Value Ref Range   Phosphorus 8.0 (*) 2.3 - 4.6 mg/dL  PROTIME-INR     Status: Abnormal   Collection Time    10/09/13  4:45 AM      Result Value Ref Range   Prothrombin Time 19.7 (*) 11.6 - 15.2 seconds   INR 1.67 (*) 0.00 - 1.49  GLUCOSE, CAPILLARY     Status: Abnormal   Collection Time    10/09/13  8:35 AM  Result Value Ref Range   Glucose-Capillary 202 (*) 70 - 99 mg/dL      Component Value Date/Time   SDES TRACHEAL ASPIRATE 10/05/2013 1654   SPECREQUEST NONE 10/05/2013 1654   CULT  Value: FEW STAPHYLOCOCCUS AUREUS Note: RIFAMPIN AND GENTAMICIN SHOULD NOT BE USED AS SINGLE DRUGS FOR TREATMENT OF STAPH INFECTIONS. Performed at [facility] 10/05/2013 1654   REPTSTATUS 10/08/2013 FINAL 10/05/2013 1654   Dg Chest Port 1 View  10/09/2013   CLINICAL DATA:  Check ETT.  EXAM: PORTABLE CHEST - 1 VIEW  COMPARISON:  10/07/2013  FINDINGS: Endotracheal tube and NG tube are unchanged in position. Left lower lobe airspace opacities again noted, slightly increased. Suspect small layering left effusion. Further improvement in the right upper lobe density since prior study.  IMPRESSION: Slight increase left lower lobe atelectasis or pneumonia with small left effusion.  Further improvement in right upper lobe opacity.   Electronically Signed   By: Rolm Baptise M.D.   On: 10/09/2013 07:27   Recent Results (from the past 240 hour(s))  CULTURE, BLOOD  (ROUTINE X 2)     Status: None   Collection Time    10/05/2013 12:00 PM      Result Value Ref Range Status   Specimen Description BLOOD RIGHT ANTECUBITAL   Final   Special Requests BOTTLES DRAWN AEROBIC AND ANAEROBIC 6MLS   Final   Culture  Setup Time     Final   Value: 09/24/2013 22:36     Performed at [facility]   Culture     Final   Value: STAPHYLOCOCCUS AUREUS     Note: SUSCEPTIBILITIES PERFORMED ON PREVIOUS CULTURE WITHIN THE LAST 5 DAYS.     Note: Gram Stain Report Called to,Read Back By and Verified With: Otelia Santee RN ON 9.25.15 AT 1915 BY HENDJ     Performed at [facility]   Report Status 10/08/2013 FINAL   Final  CULTURE, BLOOD (ROUTINE X 2)     Status: None   Collection Time    10/07/2013  1:20 PM      Result Value Ref Range Status   Specimen Description BLOOD PICC LINE   Final   Special Requests BOTTLES DRAWN AEROBIC AND ANAEROBIC 6CC RIGHT IJ   Final   Culture  Setup Time     Final   Value: 09/21/2013 23:53     Performed at [facility]   Culture     Final   Value: STAPHYLOCOCCUS AUREUS     Note: RIFAMPIN AND GENTAMICIN SHOULD NOT BE USED AS SINGLE DRUGS FOR TREATMENT OF STAPH INFECTIONS.     Note: Gram Stain Report Called to,Read Back By and Verified With: JENNIFER CLIFTON 10/06/13 3:35AM [place]     Performed at [facility]   Report Status 10/08/2013 FINAL   Final   Organism ID, Bacteria STAPHYLOCOCCUS AUREUS   Final  CULTURE, RESPIRATORY (NON-EXPECTORATED)     Status: None   Collection Time    09/20/2013  3:25 PM      Result Value Ref Range Status   Specimen Description ENDOTRACHEAL   Final   Special Requests NONE   Final   Gram Stain     Final   Value: ABUNDANT WBC PRESENT,BOTH PMN AND MONONUCLEAR     NO SQUAMOUS EPITHELIAL CELLS SEEN     ABUNDANT GRAM POSITIVE COCCI     IN PAIRS IN CLUSTERS     Performed at [facility]  Final   Value: ABUNDANT STAPHYLOCOCCUS AUREUS     Note: RIFAMPIN AND GENTAMICIN  SHOULD NOT BE USED AS SINGLE DRUGS FOR TREATMENT OF STAPH INFECTIONS.     MODERATE GRAM NEGATIVE RODS     ABUNDANT STREPTOCOCCUS,BETA HEMOLYTIC NOT GROUP A     Performed at [facility]   Report Status PENDING   Incomplete   Organism ID, Bacteria STAPHYLOCOCCUS AUREUS   Final  MRSA PCR SCREENING     Status: Abnormal   Collection Time    09/25/2013  5:05 PM      Result Value Ref Range Status   MRSA by PCR POSITIVE (*) NEGATIVE Final   Comment:            The GeneXpert MRSA Assay (FDA     approved for NASAL specimens     only), is one component of a     comprehensive MRSA colonization     surveillance program. It is not     intended to diagnose MRSA     infection nor to guide or     monitor treatment for     MRSA infections.     RESULT CALLED TO, READ BACK BY AND VERIFIED WITH:     C.MCKEOWN,RN AT [facility] 10/03/2013  URINE CULTURE     Status: None   Collection Time    10/10/2013  7:42 PM      Result Value Ref Range Status   Specimen Description URINE, CATHETERIZED   Final   Special Requests NONE   Final   Culture  Setup Time     Final   Value: 09/25/2013 20:15     Performed at [facility]     Final   Value: NO GROWTH     Performed at [facility]   Culture     Final   Value: NO GROWTH     Performed at [facility]   Report Status 10/06/2013 FINAL   Final  CULTURE, RESPIRATORY (NON-EXPECTORATED)     Status: None   Collection Time    10/05/13  4:54 PM      Result Value Ref Range Status   Specimen Description TRACHEAL ASPIRATE   Final   Special Requests NONE   Final   Gram Stain     Final   Value: ABUNDANT WBC PRESENT,BOTH PMN AND MONONUCLEAR     RARE SQUAMOUS EPITHELIAL CELLS PRESENT     FEW GRAM POSITIVE COCCI     IN PAIRS IN CLUSTERS     Performed at [facility]   Culture     Final   Value: FEW STAPHYLOCOCCUS AUREUS     Note: RIFAMPIN AND GENTAMICIN SHOULD NOT BE USED AS SINGLE DRUGS FOR TREATMENT OF STAPH  INFECTIONS.     Performed at [facility]   Report Status 10/08/2013 FINAL   Final   Organism ID, Bacteria STAPHYLOCOCCUS AUREUS   Final      10/09/2013, 11:06 AM     LOS: 5 days

## 2013-10-09 NOTE — Progress Notes (Signed)
Critical Lab Results called to [facility], Result taken by: Elzie Rings, RN  Critical Lab: calcium 5.9  Time Called: 3643   Blair Hailey, RN

## 2013-10-09 NOTE — Progress Notes (Signed)
Pt requiring multiple doses of prn Versed, Fentanyl bolus, and titration of Fentanyl gtt for agitation/restlessness and attempts to pull tubes. See MAR. Blair Hailey, RN

## 2013-10-09 NOTE — Progress Notes (Signed)
PULMONARY / CRITICAL CARE MEDICINE   Name: William Stark MRN: [id number] DOB: 05/04/1945    ADMISSION DATE:  10/08/2013 CONSULTATION DATE:  10/09/2013  REFERRING MD :  EDP  CHIEF COMPLAINT:  Fever  INITIAL PRESENTATION: 68 y.o. M, nursing home resident [facility]) brought to [facility] ED on 9/23 with fever x 1 day to 101.5.  In ED, developed respiratory failure, failed BiPAP and required intubation.  PCCM was consulted for admission.   RUQ U/S 9/24 > normal CBD, no gallbladder  SIGNIFICANT EVENTS: 9/23 - intubated while in ED (by CCM), admitted to ICU. RUL PNA on CXR 9/23 - family wants limited code, no HD 9/24 > LVEF 55-60%, wall motion abnormalities 9/25: Staph in sputum, GPC in blood, minimal UOP, hyper k,  10/07/13:  Patient now full no code blue without desire for escalation - per RN ok for pressors but NO CPR and no HD. GPC in blood +. On levophed and vaso 9/28  Failed SBT  SUBJECTIVE/OVERNIGHT/INTERVAL HX Per nursing, increased versed needed due to agitation, failed wean attempt, levophed decreased to 4,CBG's have been elevated, cont to have drainage around CVC R fem  VITAL SIGNS: Temp:  [97.2 F (36.2 C)-97.4 F (36.3 C)] 97.4 F (36.3 C) (09/28 0837) Pulse Rate:  [71-112] 91 (09/28 1000) Resp:  [9-22] 11 (09/28 1000) BP: (78-138)/(32-85) 78/55 mmHg (09/28 1000) SpO2:  [100 %] 100 % (09/28 1000) FiO2 (%):  [30 %] 30 % (09/28 0827) Weight:  [221 lb 5.5 oz (100.4 kg)] 221 lb 5.5 oz (100.4 kg) (09/28 0400) HEMODYNAMICS:   VENTILATOR SETTINGS: Vent Mode:  [-] PRVC FiO2 (%):  [30 %] 30 % Set Rate:  [12 bmp] 12 bmp Vt Set:  [630 mL] 630 mL PEEP:  [5 cmH20] 5 cmH20 Plateau Pressure:  [15 cmH20-19 cmH20] 18 cmH20 INTAKE / OUTPUT: Intake/Output     09/27 0701 - 09/28 0700 09/28 0701 - 09/29 0700   I.V. (mL/kg) 1749 (17.4) 47.5 (0.5)   NG/GT 1100 0   IV Piggyback 500    Total Intake(mL/kg) 3349 (33.4) 47.5 (0.5)   Urine (mL/kg/hr) 1375 (0.6) 125 (0.4)   Total  Output 1375 125   Net +1974 -77.5        Stool Occurrence 1 x 1 x     PHYSICAL EXAMINATION: Gen: sedated on vent HEENT: NCAT PULM: CTA CV:  Regular, no m/r/g AB: BS+, distended, non-tender Ext: warm, massive edema all over, line with some drainage noted Neuro: RASS -2 heavily on vent. Agitated on WUA  LABS: PULMONARY  Recent Labs Lab 09/26/2013 1629 10/05/13 0743 10/06/13 0858 10/08/13 1855 10/09/13 0325  PHART 7.199* 7.152* 7.156* 7.354 7.363  PCO2ART 43.4 44.6 43.8 39.8 40.5  PO2ART 417.0* 113.0* 122.0* 30.0* 84.4  HCO3 16.8* 15.2* 15.1* 22.3 22.5  TCO2 18 16 16.5 24 23.7  O2SAT 100.0 96.0 97.1 58.0 93.2    CBC  Recent Labs Lab 10/07/13 0600 10/08/13 0520 10/09/13 0445  HGB 8.0* 7.7* 8.0*  HCT 22.4* 21.7* 22.6*  WBC 39.6* 29.7* 27.4*  PLT 181 139* 113*    COAGULATION  Recent Labs Lab 09/14/2013 1900 10/06/13 1115 10/07/13 1305 10/09/13 0445  INR 6.04* 2.25* 1.77* 1.67*    CARDIAC    Recent Labs Lab 10/09/2013 1900 10/05/13 0200 10/05/13 0800  TROPONINI <0.30 <0.30 <0.30    Recent Labs Lab 09/17/2013 1135  PROBNP 924.9*      CHEMISTRY  Recent Labs Lab 09/25/2013 1900 10/05/13 0830 10/06/13 0500 10/06/13 1332 10/07/13  0600 10/09/13 0445  NA 141 139 140 137 138 141  K 4.7 5.6* 5.5* 5.0 5.5* 4.2  CL 113* 113* 110 109 103 102  CO2 15* 15* 15* 14* 17* 24  GLUCOSE 141* 169* 117* 155* 194* 226*  BUN 38* 46* 59* 58* 71* 79*  CREATININE 2.73* 3.49* 4.19* 3.96* 4.69* 4.79*  CALCIUM 6.9* 6.5* 6.2* 5.8* 5.9* 5.9*  MG 1.8  --   --   --   --  2.1  PHOS 6.7*  --   --   --   --  8.0*   Estimated Creatinine Clearance: 18.1 ml/min (by C-G formula based on Cr of 4.79).   LIVER  Recent Labs Lab 09/17/2013 1135 09/20/2013 1900 10/06/13 1115 10/07/13 1305 10/09/13 0445  AST 111* 118*  --   --   --   ALT 156* 147*  --   --   --   ALKPHOS 368* 414*  --   --   --   BILITOT 1.4* 1.5*  --   --   --   PROT 6.3 5.9*  --   --   --   ALBUMIN 1.8*  1.6*  --   --   --   INR  --  6.04* 2.25* 1.77* 1.67*     INFECTIOUS  Recent Labs Lab 09/30/2013 1145 10/03/2013 1900 10/05/13 0400 10/05/13 0806 10/06/13 0500  LATICACIDVEN 2.79* 3.2*  --  2.6*  --   PROCALCITON  --  26.82 46.12  --  39.52     ENDOCRINE CBG (last 3)   Recent Labs  10/09/13 0029 10/09/13 0344 10/09/13 0835  GLUCAP 149* 179* 202*         IMAGING x48h Dg Chest Port 1 View  10/09/2013   CLINICAL DATA:  Check ETT.  EXAM: PORTABLE CHEST - 1 VIEW  COMPARISON:  10/07/2013  FINDINGS: Endotracheal tube and NG tube are unchanged in position. Left lower lobe airspace opacities again noted, slightly increased. Suspect small layering left effusion. Further improvement in the right upper lobe density since prior study.  IMPRESSION: Slight increase left lower lobe atelectasis or pneumonia with small left effusion.  Further improvement in right upper lobe opacity.   Electronically Signed   By: Charlett Nose M.D.   On: 10/09/2013 07:27        ASSESSMENT / PLAN:  PULMONARY OETT 9/23 >>> A: Acute hypoxemic respiratory failure MSSA  HCAP  P:   Full mechanical support, wean as able. VAP bundle. SBT indicated, pressors are reduced, wean cpap5 ps 5-10 as volumes were low on ps 5 pcxr reviewed, monitor secretions Failed SBT 9/28 Will discuss no reintubation with family once extubated  CARDIOVASCULAR CVL R IJ 9/23 placed by ED >>>9/23 (pulled out by patient) CVL R Fem 9/23>> dc today 9/28 some discharge A Line R Fem 9/23 >>>9/25 (pulled out by patient)  Hx of HT  A:  Septic Shock - currently on levophed DVT   left posterior tibial vein - per LE dopplers 9/10.  P:  Levophed gtt (weaning) > titrated to MAP > 65, goal to off and consider no restart Family would not want restart hydrocortisone for stress dose steroids - wean in am Restart Heparin gtt  In future(9/28 INR 1.67), hold off with plat drop and line to dc See discussion below  RENAL A:   ATN  -  with severe acidosis, patient/family does not want hemodialysis: officially ATN on 10/07/13. Family rejected HD  P:   D/C bicarb  BMP in AM. Low calcium, replace  GASTROINTESTINAL A:   Nutrition GI prophylaxis Elevated alk phos/transaminasis, history of cholestectomy> normal RUQ U/S 9/24 P:   Nutrition TF's,  started 10/07/13 SUP: Famotidine.  HEMATOLOGIC A:   VTE Prophylaxis DVT left posterior tibial vein - per LE dopplers 9/10.  - high INR 9/26 and heparing gtt on hold Drop in plat  P:  CBC in AM. Hold outpatient Xarelto and restart inpatient Heparin gtt in future, after line out and plat trend INR 10/09/13 1.67  INFECTIOUS 09/27/2013 - MRSA PCR - positive Blood culture 9/23 - STaph aureus - MSSA Urine 9/23 - negative final REsp 9/23 - abundant staph aureus - MSSA Resp 9/24 - few staph - pending  A:   Septic Shock -severe, secondary to MSSA pneumonia  P:   Stop Zosyn Start Imipenem 9/24 >>9/25 Start diflucan 9/24 >>9/25 Vanc, start date 9/23, > 9/26 Start ANCEF 9/27 for MSSA >> Draw f/u blood cultures if speciation is staph  Dc fem line as dc noted Await repeat BC  ENDOCRINE A:   DM P:   ICU hyperglycemia protocol phase 1.  NEUROLOGIC A:   Acute metabolic encephalopathy P:   Sedation:  Fentanyl gtt. RASS goal: 0 to -1. Daily WUA.  TODAY'S SUMMARY: dc fem line, likely will need a new line, weaning, need goals of care talk   I have personally obtained a history, examined the patient, evaluated laboratory and imaging results, formulated the assessment and plan and placed orders. CRITICAL CARE: The patient is critically ill with multiple organ systems failure and requires high complexity decision making for assessment and support, frequent evaluation and titration of therapies, application of advanced monitoring technologies and extensive interpretation of multiple databases. Critical Care Time devoted to patient care services described in this note is 30  minutes.    10/09/2013 10:30 AM  Lavon Paganini. Titus Mould, MD, [place] Pgr: [facility]

## 2013-10-09 NOTE — Procedures (Signed)
Central Venous Catheter Insertion Procedure Note William Stark [id number] 1945/04/03  Procedure: Insertion of Central Venous Catheter Indications: Assessment of intravascular volume, Drug and/or fluid administration and Frequent blood sampling  Procedure Details Consent: Unable to obtain consent because of altered level of consciousness. Time Out: Verified patient identification, verified procedure, site/side was marked, verified correct patient position, special equipment/implants available, medications/allergies/relevent history reviewed, required imaging and test results available.  Performed  Maximum sterile technique was used including antiseptics, cap, gloves, gown, hand hygiene, mask and sheet. Skin prep: Chlorhexidine; local anesthetic administered A antimicrobial bonded/coated triple lumen catheter was placed in the left internal jugular vein using the Seldinger technique.  Evaluation Blood flow good Complications: No apparent complications Patient did tolerate procedure well. Chest X-ray ordered to verify placement.  CXR: pending.  Procedure performed under direct ultrasound guidance for real time vessel cannulation.      Montey Hora, [facility] Pgr: [phone]  or [phone]    Supervised [place]  Frantz Quattrone J. Titus Mould, MD, [place] Pgr: [facility]

## 2013-10-09 NOTE — Progress Notes (Signed)
Inpatient Diabetes Program Recommendations  AACE/ADA: New Consensus Statement on Inpatient Glycemic Control (2013)  Target Ranges:  Prepandial:   less than 140 mg/dL      Peak postprandial:   less than 180 mg/dL (1-2 hours)      Critically ill patients:  140 - 180 mg/dL   Results for KEYLAN, COSTABILE (MRN [id number]) as of 10/09/2013 10:36  Ref. Range 10/08/2013 07:37 10/08/2013 14:58 10/09/2013 00:29 10/09/2013 03:44 10/09/2013 08:35  Glucose-Capillary Latest Range: 70-99 mg/dL 292 (H) 119 (H) 149 (H) 179 (H) 202 (H)   Diabetes history: DM2 Outpatient Diabetes medications: Novolog 2-15 units TID with meals Current orders for Inpatient glycemic control: Novolog 0-15 units Q4H  Inpatient Diabetes Program Recommendations Insulin - Basal: Please consider ordering Levemir 10 units daily (based on 100 kg x 0.1 units). Correction (SSI): While patient is intubated and on tube feedings and steroids, please consider discontinuing the current Novolog 0-15 units Q4H and order ICU Glycemic Control order set Phase 1.  Thanks, Barnie Alderman, RN, MSN, CCRN Diabetes Coordinator Inpatient Diabetes Program [phone] (Team Pager) [phone] ([facility] office) [phone] [facility] office)

## 2013-10-10 DIAGNOSIS — Z515 Encounter for palliative care: Secondary | ICD-10-CM

## 2013-10-10 LAB — GLUCOSE, CAPILLARY
GLUCOSE-CAPILLARY: 167 mg/dL — AB (ref 70–99)
Glucose-Capillary: 159 mg/dL — ABNORMAL HIGH (ref 70–99)
Glucose-Capillary: 185 mg/dL — ABNORMAL HIGH (ref 70–99)
Glucose-Capillary: 155 mg/dL — ABNORMAL HIGH (ref 70–99)

## 2013-10-10 LAB — CBC WITH DIFFERENTIAL/PLATELET
BASOS ABS: 0 10*3/uL (ref 0.0–0.1)
Basophils Relative: 0 % (ref 0–1)
EOS ABS: 0 10*3/uL (ref 0.0–0.7)
Eosinophils Relative: 0 % (ref 0–5)
HCT: 21.3 % — ABNORMAL LOW (ref 39.0–52.0)
Hemoglobin: 7.3 g/dL — ABNORMAL LOW (ref 13.0–17.0)
LYMPHS ABS: 1 10*3/uL (ref 0.7–4.0)
Lymphocytes Relative: 5 % — ABNORMAL LOW (ref 12–46)
MCH: 28.6 pg (ref 26.0–34.0)
MCHC: 34.3 g/dL (ref 30.0–36.0)
MCV: 83.5 fL (ref 78.0–100.0)
Monocytes Absolute: 0.4 10*3/uL (ref 0.1–1.0)
Monocytes Relative: 2 % — ABNORMAL LOW (ref 3–12)
Neutro Abs: 18.3 10*3/uL — ABNORMAL HIGH (ref 1.7–7.7)
Neutrophils Relative %: 93 % — ABNORMAL HIGH (ref 43–77)
Platelets: 89 10*3/uL — ABNORMAL LOW (ref 150–400)
RBC: 2.55 MIL/uL — ABNORMAL LOW (ref 4.22–5.81)
RDW: 17.7 % — AB (ref 11.5–15.5)
WBC: 19.7 10*3/uL — AB (ref 4.0–10.5)

## 2013-10-10 LAB — PHOSPHORUS: Phosphorus: 7.9 mg/dL — ABNORMAL HIGH (ref 2.3–4.6)

## 2013-10-10 LAB — MAGNESIUM: Magnesium: 2.2 mg/dL (ref 1.5–2.5)

## 2013-10-10 LAB — BASIC METABOLIC PANEL
ANION GAP: 14 (ref 5–15)
BUN: 85 mg/dL — AB (ref 6–23)
CALCIUM: 6.2 mg/dL — AB (ref 8.4–10.5)
CHLORIDE: 105 meq/L (ref 96–112)
CO2: 25 mEq/L (ref 19–32)
CREATININE: 4.74 mg/dL — AB (ref 0.50–1.35)
GFR, EST AFRICAN AMERICAN: 13 mL/min — AB (ref >90)
GFR, EST NON AFRICAN AMERICAN: 11 mL/min — AB (ref >90)
Glucose, Bld: 169 mg/dL — ABNORMAL HIGH (ref 70–99)
Potassium: 4 mEq/L (ref 3.7–5.3)
Sodium: 144 mEq/L (ref 137–147)

## 2013-10-10 MED ORDER — MORPHINE SULFATE 10 MG/ML IJ SOLN
10.0000 mg/h | INTRAVENOUS | Status: DC
  Administered 2013-10-10: 10 mg/h via INTRAVENOUS
  Administered 2013-10-10 (×2): 27 mg/h via INTRAVENOUS
  Administered 2013-10-11: 10 mg/h via INTRAVENOUS
  Administered 2013-10-11: 25 mg/h via INTRAVENOUS
  Administered 2013-10-11: 45 mg/h via INTRAVENOUS
  Administered 2013-10-11: 25 mg/h via INTRAVENOUS
  Administered 2013-10-11 (×3): 10 mg/h via INTRAVENOUS
  Administered 2013-10-11: 25 mg/h via INTRAVENOUS
  Administered 2013-10-11: 40 mg/h via INTRAVENOUS
  Filled 2013-10-10 (×8): qty 10

## 2013-10-10 MED ORDER — SODIUM CHLORIDE 0.9 % IV SOLN: INTRAVENOUS | Status: DC

## 2013-10-10 MED ORDER — MORPHINE SULFATE 2 MG/ML IJ SOLN
INTRAMUSCULAR | Status: AC
  Administered 2013-10-10: 2 mg via INTRAVENOUS
  Filled 2013-10-10: qty 1

## 2013-10-10 MED ORDER — FUROSEMIDE 10 MG/ML IJ SOLN
40.0000 mg | Freq: Two times a day (BID) | INTRAMUSCULAR | Status: DC
Start: 2013-10-10 — End: 2013-10-10
  Administered 2013-10-10: 40 mg via INTRAVENOUS
  Filled 2013-10-10: qty 4

## 2013-10-10 MED ORDER — PANTOPRAZOLE SODIUM 40 MG IV SOLR
40.0000 mg | INTRAVENOUS | Status: DC
  Administered 2013-10-10: 40 mg via INTRAVENOUS
  Filled 2013-10-10 (×2): qty 40

## 2013-10-10 MED ORDER — MORPHINE SULFATE 2 MG/ML IJ SOLN
2.0000 mg | INTRAMUSCULAR | Status: DC | PRN
  Administered 2013-10-10: 2 mg via INTRAVENOUS
  Filled 2013-10-10: qty 1

## 2013-10-10 MED ORDER — MORPHINE BOLUS VIA INFUSION
5.0000 mg | INTRAVENOUS | Status: DC | PRN
  Administered 2013-10-10: 12 mg via INTRAVENOUS
  Administered 2013-10-10: 20 mg via INTRAVENOUS
  Administered 2013-10-10: 18 mg via INTRAVENOUS
  Administered 2013-10-10: 10 mg via INTRAVENOUS
  Administered 2013-10-10: 15 mg via INTRAVENOUS
  Filled 2013-10-10: qty 20

## 2013-10-10 MED ORDER — SODIUM CHLORIDE 0.9 % IV SOLN
1.0000 g | Freq: Once | INTRAVENOUS | Status: AC
  Administered 2013-10-10: 1 g via INTRAVENOUS
  Filled 2013-10-10: qty 10

## 2013-10-10 NOTE — Progress Notes (Signed)
Critical Lab Results called to [facility], Result taken by: Dr Titus Mould  Critical Lab: 6.2  Time Called: 6:58 AM  Blair Hailey, RN

## 2013-10-10 NOTE — Progress Notes (Signed)
2nd set of blood cultures drawn from proximal port of left IJ CVC and sent to lab... Blair Hailey, RN

## 2013-10-10 NOTE — Procedures (Signed)
Extubation Procedure Note  Patient Details:   Name: William Stark DOB: 12/11/1945 MRN: [id number]   Airway Documentation:     Evaluation  O2 sats: stable throughout Complications: No apparent complications Patient did tolerate procedure well. Bilateral Breath Sounds: Diminished Suctioning: Airway No  Patient extubated to 4 LNC at this time. Family at bedside. RT to monitor as needed  Saunders Glance 10/10/2013, 4:09 PM

## 2013-10-10 NOTE — Progress Notes (Signed)
PULMONARY / CRITICAL CARE MEDICINE   Name: William Stark MRN: [id number] DOB: [birth year]    ADMISSION DATE:  10/01/2013 CONSULTATION DATE:  10/10/2013  REFERRING MD :  EDP  CHIEF COMPLAINT:  Fever  INITIAL PRESENTATION: 68 y.o. M, nursing home resident [facility]) brought to [facility] ED on 9/23 with fever x 1 day to 101.5.  In ED, developed respiratory failure, failed BiPAP and required intubation.  PCCM was consulted for admission.   RUQ U/S 9/24 > normal CBD, no gallbladder  SIGNIFICANT EVENTS: 9/23 - intubated while in ED (by CCM), admitted to ICU. RUL PNA on CXR 9/23 - family wants limited code, no HD 9/24 > LVEF 55-60%, wall motion abnormalities 9/25: Staph in sputum, GPC in blood, minimal UOP, hyper k,  10/07/13:  Patient now full no code blue without desire for escalation - per RN ok for pressors but NO CPR and no HD. GPC in blood +. On levophed and vaso 9/28  Failed SBT 9/29 planned extubation for 9/29 4 pm with DNI status per family.  SUBJECTIVE/OVERNIGHT/INTERVAL HX Sedated with fentanyl, agitated or sedated. Line changed  VITAL SIGNS: Temp:  [97.2 F (36.2 C)-97.4 F (36.3 C)] 97.4 F (36.3 C) (09/29 0342) Pulse Rate:  [80-111] 80 (09/29 0600) Resp:  [10-20] 14 (09/29 0600) BP: (71-115)/(46-77) 80/49 mmHg (09/29 0600) SpO2:  [99 %-100 %] 100 % (09/29 0301) FiO2 (%):  [30 %] 30 % (09/29 0301) Weight:  [220 lb 14.4 oz (100.2 kg)] 220 lb 14.4 oz (100.2 kg) (09/29 0400) HEMODYNAMICS:   VENTILATOR SETTINGS: Vent Mode:  [-] PRVC FiO2 (%):  [30 %] 30 % Set Rate:  [12 bmp] 12 bmp Vt Set:  [630 mL] 630 mL PEEP:  [5 cmH20] 5 cmH20 Pressure Support:  [5 cmH20] 5 cmH20 Plateau Pressure:  [14 cmH20-19 cmH20] 16 cmH20 INTAKE / OUTPUT: Intake/Output     09/28 0701 - 09/29 0700 09/29 0701 - 09/30 0700   I.V. (mL/kg) 631.4 (6.3)    NG/GT 700    IV Piggyback 160    Total Intake(mL/kg) 1491.4 (14.9)    Urine (mL/kg/hr) 1000 (0.4)    Total Output 1000     Net +491.4           Stool Occurrence 1 x      PHYSICAL EXAMINATION: Gen: sedated on vent HEENT: NCAT PULM: CTA, anterior   CV:  Regular, no m/r/g AB: BS+, distended, non-tender Ext: warm, massive edema all over Neuro: RASS -2 heavily on vent. Agitated on WUA  LABS: PULMONARY  Recent Labs Lab 10/05/2013 1629 10/05/13 0743 10/06/13 0858 10/08/13 1855 10/09/13 0325  PHART 7.199* 7.152* 7.156* 7.354 7.363  PCO2ART 43.4 44.6 43.8 39.8 40.5  PO2ART 417.0* 113.0* 122.0* 30.0* 84.4  HCO3 16.8* 15.2* 15.1* 22.3 22.5  TCO2 18 16 16.5 24 23.7  O2SAT 100.0 96.0 97.1 58.0 93.2    CBC  Recent Labs Lab 10/08/13 0520 10/09/13 0445 10/10/13 0535  HGB 7.7* 8.0* 7.3*  HCT 21.7* 22.6* 21.3*  WBC 29.7* 27.4* 19.7*  PLT 139* 113* 89*    COAGULATION  Recent Labs Lab 09/27/2013 1900 10/06/13 1115 10/07/13 1305 10/09/13 0445  INR 6.04* 2.25* 1.77* 1.67*    CARDIAC    Recent Labs Lab 09/29/2013 1900 10/05/13 0200 10/05/13 0800  TROPONINI <0.30 <0.30 <0.30    Recent Labs Lab 10/10/2013 1135  PROBNP 924.9*      CHEMISTRY  Recent Labs Lab 10/10/2013 1900  10/06/13 0500 10/06/13 1332 10/07/13 0600 10/09/13 0445  10/10/13 0535  NA 141  < > 140 137 138 141 144  K 4.7  < > 5.5* 5.0 5.5* 4.2 4.0  CL 113*  < > 110 109 103 102 105  CO2 15*  < > 15* 14* 17* 24 25  GLUCOSE 141*  < > 117* 155* 194* 226* 169*  BUN 38*  < > 59* 58* 71* 79* 85*  CREATININE 2.73*  < > 4.19* 3.96* 4.69* 4.79* 4.74*  CALCIUM 6.9*  < > 6.2* 5.8* 5.9* 5.9* 6.2*  MG 1.8  --   --   --   --  2.1 2.2  PHOS 6.7*  --   --   --   --  8.0* 7.9*  < > = values in this interval not displayed. Estimated Creatinine Clearance: 18.3 ml/min (by C-G formula based on Cr of 4.74).   LIVER  Recent Labs Lab 09/28/2013 1135 09/20/2013 1900 10/06/13 1115 10/07/13 1305 10/09/13 0445  AST 111* 118*  --   --   --   ALT 156* 147*  --   --   --   ALKPHOS 368* 414*  --   --   --   BILITOT 1.4* 1.5*  --   --   --   PROT 6.3  5.9*  --   --   --   ALBUMIN 1.8* 1.6*  --   --   --   INR  --  6.04* 2.25* 1.77* 1.67*     INFECTIOUS  Recent Labs Lab 09/25/2013 1145 10/10/2013 1900 10/05/13 0400 10/05/13 0806 10/06/13 0500  LATICACIDVEN 2.79* 3.2*  --  2.6*  --   PROCALCITON  --  26.82 46.12  --  39.52     ENDOCRINE CBG (last 3)   Recent Labs  10/09/13 2100 10/09/13 2349 10/10/13 0344  GLUCAP 222* 195* 159*         IMAGING x48h Dg Chest Port 1 View  10/09/2013   CLINICAL DATA:  Central line adjustment.  EXAM: PORTABLE CHEST - 1 VIEW  COMPARISON:  10/09/2013  FINDINGS: Since the previous study, the left central venous catheter has been repositioned. The tip is now directed laterally and is projected over the subclavian vein. Endotracheal tube tip measures 5.1 cm above the carina. Enteric tube tip is not visualized past the EG junction due to soft tissue attenuation. Heart size and pulmonary vascularity appear normal. There is infiltration or atelectasis in the left lung base. No pneumothorax.  IMPRESSION: Repositioning of left central venous catheter with tip now directed laterally, overlying the left subclavian vein.   Electronically Signed   By: Lucienne Capers M.D.   On: 10/09/2013 21:57   Dg Chest Port 1 View  10/09/2013   CLINICAL DATA:  Central line placement.  EXAM: PORTABLE CHEST - 1 VIEW  COMPARISON:  10/09/2013 at 0557 hr.  FINDINGS: Support apparatus: Endotracheal tube and enteric tube are unchanged. New LEFT IJ central line is present which is malpositioned with the tip extending into the LEFT axilla. Monitoring leads project over the chest.  Cardiomediastinal Silhouette:  Unchanged.  Lungs: Unchanged basilar opacity.  No pneumothorax.  Effusions:  Probable small LEFT  Other:  Patient rotated to the RIGHT.  IMPRESSION: 1. Placement of malpositioned LEFT IJ central line with the tip in the LEFT axilla. These results were called by telephone at the time of interpretation on 10/09/2013 at 9:19 pm to  Dr. Lamonte Sakai , who verbally acknowledged these results. 2. Other support apparatus stable. 3. Unchanged  basilar opacity and small LEFT pleural effusion.   Electronically Signed   By: Dereck Ligas M.D.   On: 10/09/2013 21:20   Dg Chest Port 1 View  10/09/2013   CLINICAL DATA:  Check ETT.  EXAM: PORTABLE CHEST - 1 VIEW  COMPARISON:  10/07/2013  FINDINGS: Endotracheal tube and NG tube are unchanged in position. Left lower lobe airspace opacities again noted, slightly increased. Suspect small layering left effusion. Further improvement in the right upper lobe density since prior study.  IMPRESSION: Slight increase left lower lobe atelectasis or pneumonia with small left effusion.  Further improvement in right upper lobe opacity.   Electronically Signed   By: Rolm Baptise M.D.   On: 10/09/2013 07:27   Dg Abd Portable 1v  10/09/2013   CLINICAL DATA:  OG tube placement.  EXAM: PORTABLE ABDOMEN - 1 VIEW  COMPARISON:  None.  FINDINGS: Enteric tube is coiled once over the gastric fundus and has tip in the right upper quadrant likely within the proximal duodenum. Bowel gas pattern is nonobstructive. There is a catheter along the course of the right iliac vessels with tip in the region of the common iliac vessels as this may be a femoral venous catheter. Remainder of the exam is unremarkable.  IMPRESSION: Nonobstructive bowel gas pattern.  Enteric tube as described with tip over the right upper quadrant likely over the proximal duodenum.   Electronically Signed   By: Marin Olp M.D.   On: 10/09/2013 23:05        ASSESSMENT / PLAN:  PULMONARY OETT 9/23 >>> A: Acute hypoxemic respiratory failure MSSA  HCAP  P:   Full mechanical support, wean as able. But planned extubation 9/29 4 pm with DNI per family request. VAP bundle. Wean this am cpap5 ps,5 re evaluate TV response, RSBI Cough, gag maximized  CARDIOVASCULAR CVL R IJ 9/23 placed by ED >>>9/23 (pulled out by patient) CVL R Fem 9/23>> dc  9/28   A Line R Fem 9/23 >>>9/25 (pulled out by patient) 9/28 lt i j cvl>> Hx of HT  A:  Septic Shock - currently off levophed DVT   left posterior tibial vein - per LE dopplers 9/10.  P:  Family would not want restart of any pressors hydrocortisone for stress dose steroids - wean 9/29 Continued to hold heparin See discussion below, heme  RENAL Lab Results  Component Value Date   CREATININE 4.74* 10/10/2013   CREATININE 4.79* 10/09/2013   CREATININE 4.69* 10/07/2013    A:   ATN  - with severe acidosis, patient/family does not want hemodialysis: officially ATN on 10/07/13. Family rejected HD  P:   BMP in AM. Low calcium, replace Avoid such gross overload Lasix consideration  GASTROINTESTINAL A:   Nutrition GI prophylaxis Elevated alk phos/transaminasis, history of cholestectomy> normal RUQ U/S 9/24 P:   Nutrition TF's,  started 10/07/13, hold for planned extubation SUP: Famotidine, with drop in plat, consider to ppi  HEMATOLOGIC A:   VTE Prophylaxis DVT left posterior tibial vein - per LE dopplers 9/10.  - high INR 9/26 and heparing gtt on hold Drop in plat , r/o HITT P:  CBC daily. Hold outpatient Xarelto - kidney failure Continue to hold heparin, send HITT assay INR 10/09/13 1.67 , none for 9/29 Hold off bival empiric , anemia  INFECTIOUS 09/22/2013 - MRSA PCR - positive Blood culture 9/23 - STaph aureus - MSSA Urine 9/23 - negative final REsp 9/23 - abundant staph aureus - MSSA Resp 9/24 -  mssa 9/29 bc>> A:   Septic Shock -severe, secondary to MSSA pneumonia  P:   Stopped Zosyn Start Imipenem 9/24 >>9/25 Start diflucan 9/24 >>9/25 Vanc, start date 9/23, > 9/26 Start ANCEF 9/27 for MSSA >>stop date consider 10-14 days  Fem line dc'ed, had discharge Await repeat BC  ENDOCRINE A:   DM P:   ICU hyperglycemia protocol phase 1, consider change to regular ssi  NEUROLOGIC A:   Acute metabolic encephalopathy P:   Sedation:  Fentanyl gtt. RASS goal: 0  to -1. Daily WUA.  TODAY'S SUMMARY:  Currently off pressors but BP is soft. Sedated or agitated. Planned extubation at 4 pm per family request with DNI. Renal failure is worse and dialysis is not an option per family. Hopefully extubation will be successful if not then comfort care will be instituted.   Appears that he has not thrived, poor fxnal status, now MODS, full no code blue wishes reoccurrence cancer?  Ccm time 30 min  Richardson Landry Minor ACNP Maryanna Shape PCCM Pager [phone] till 3 pm If no answer page [phone] 10/10/2013, 7:50 AM    I have fully examined this patient and agree with above findings.    And edited in full  Lavon Paganini. Titus Mould, MD, [place] Pgr: [facility]

## 2013-10-10 NOTE — Progress Notes (Signed)
[facility] Progress Note Patient Name: William Stark DOB: 1945/04/13 MRN: [id number]   Date of Service  10/10/2013  HPI/Events of Note  Reviewed status with Loyola Mast RN and with family by camera. He has been progressively uncomfortable. tachypneic even with MSO4 pushes.    eICU Interventions  Will transition to a morphine gtt. If he looks stable once initiated, I will try to get him a private room out of the ICU     Intervention Category Intermediate Interventions: Respiratory distress - evaluation and management  BYRUM,ROBERT S. 10/10/2013, 6:23 PM

## 2013-10-10 NOTE — Progress Notes (Signed)
INFECTIOUS DISEASE PROGRESS NOTE  ID: William Stark is a 68 y.o. male with  Active Problems:   Respiratory failure, acute   DNAR (do not attempt resuscitation)  Subjective: On vent  Abtx:  Anti-infectives   Start     Dose/Rate Route Frequency Ordered Stop   10/09/13 2200  ceFAZolin (ANCEF) IVPB 1 g/50 mL premix     1 g 100 mL/hr over 30 Minutes Intravenous Every 12 hours 10/09/13 1136     10/08/13 1430  ceFAZolin (ANCEF) IVPB 1 g/50 mL premix  Status:  Discontinued     1 g 100 mL/hr over 30 Minutes Intravenous Every 24 hours 10/08/13 1419 10/09/13 1136   10/07/13 2200  linezolid (ZYVOX) IVPB 600 mg  Status:  Discontinued     600 mg 300 mL/hr over 60 Minutes Intravenous Every 12 hours 10/07/13 1606 10/07/13 1606   10/07/13 1430  vancomycin (VANCOCIN) IVPB 1000 mg/200 mL premix  Status:  Discontinued     1,000 mg 200 mL/hr over 60 Minutes Intravenous  Once 10/07/13 1415 10/07/13 1606   10/05/13 1200  fluconazole (DIFLUCAN) IVPB 200 mg  Status:  Discontinued     200 mg 100 mL/hr over 60 Minutes Intravenous Every 24 hours 10/05/13 1048 10/06/13 0849   10/05/13 0900  imipenem-cilastatin (PRIMAXIN) 250 mg in sodium chloride 0.9 % 100 mL IVPB  Status:  Discontinued     250 mg 200 mL/hr over 30 Minutes Intravenous Every 6 hours 10/05/13 0813 10/06/13 0849   09/27/2013 2300  vancomycin (VANCOCIN) IVPB 750 mg/150 ml premix  Status:  Discontinued     750 mg 150 mL/hr over 60 Minutes Intravenous Every 12 hours 10/09/2013 1155 10/05/13 1312   09/17/2013 2300  vancomycin (VANCOCIN) IVPB 750 mg/150 ml premix  Status:  Discontinued     750 mg 150 mL/hr over 60 Minutes Intravenous Every 12 hours 10/10/2013 1155 10/03/2013 1157   09/20/2013 2300  ceFEPIme (MAXIPIME) 1 g in dextrose 5 % 50 mL IVPB  Status:  Discontinued     1 g 100 mL/hr over 30 Minutes Intravenous Every 12 hours 09/16/2013 1157 10/05/2013 1610   09/12/2013 2200  piperacillin-tazobactam (ZOSYN) IVPB 3.375 g  Status:  Discontinued     3.375 g 12.5 mL/hr over 240 Minutes Intravenous 3 times per day 10/06/2013 1611 10/05/13 0811   09/30/2013 1145  ceFEPIme (MAXIPIME) 2 g in dextrose 5 % 50 mL IVPB     2 g 100 mL/hr over 30 Minutes Intravenous  Once 09/13/2013 1142 09/24/2013 1447   09/15/2013 1145  vancomycin (VANCOCIN) IVPB 1000 mg/200 mL premix     1,000 mg 200 mL/hr over 60 Minutes Intravenous  Once 09/24/2013 1142 09/18/2013 1430      Medications:  Scheduled: . antiseptic oral rinse  7 mL Mouth Rinse QID  .  ceFAZolin (ANCEF) IV  1 g Intravenous Q12H  . chlorhexidine  15 mL Mouth Rinse BID  . feeding supplement (VITAL HIGH PROTEIN)  1,000 mL Per Tube Q24H  . fentaNYL  50 mcg Intravenous Once  . furosemide  40 mg Intravenous Q12H  . hydrocortisone sodium succinate  50 mg Intravenous 4 times per day  . insulin aspart  0-15 Units Subcutaneous 6 times per day  . pantoprazole (PROTONIX) IV  40 mg Intravenous Q24H    Objective: Vital signs in last 24 hours: Temp:  [97.2 F (36.2 C)-98 F (36.7 C)] 98 F (36.7 C) (09/29 9323) Pulse Rate:  [80-111] 80 (09/29 0600) Resp:  [  10-20] 14 (09/29 0600) BP: (71-115)/(46-77) 80/49 mmHg (09/29 0600) SpO2:  [99 %-100 %] 100 % (09/29 0301) FiO2 (%):  [30 %] 30 % (09/29 0744) Weight:  [100.2 kg (220 lb 14.4 oz)] 100.2 kg (220 lb 14.4 oz) (09/29 0400)   General appearance: no distress Resp: rhonchi bilaterally Cardio: regular rate and rhythm GI: normal findings: bowel sounds normal Skin: anasarca, skin peeling.   Lab Results  Recent Labs  10/09/13 0445 10/10/13 0535  WBC 27.4* 19.7*  HGB 8.0* 7.3*  HCT 22.6* 21.3*  NA 141 144  K 4.2 4.0  CL 102 105  CO2 24 25  BUN 79* 85*  CREATININE 4.79* 4.74*   Liver Panel No results found for this basename: PROT, ALBUMIN, AST, ALT, ALKPHOS, BILITOT, BILIDIR, IBILI,  in the last 72 hours Sedimentation Rate No results found for this basename: ESRSEDRATE,  in the last 72 hours C-Reactive Protein No results found for this basename:  CRP,  in the last 72 hours  Microbiology: Recent Results (from the past 240 hour(s))  CULTURE, BLOOD (ROUTINE X 2)     Status: None   Collection Time    10/05/2013 12:00 PM      Result Value Ref Range Status   Specimen Description BLOOD RIGHT ANTECUBITAL   Final   Special Requests BOTTLES DRAWN AEROBIC AND ANAEROBIC 6MLS   Final   Culture  Setup Time     Final   Value: 10/02/2013 22:36     Performed at [facility]   Culture     Final   Value: STAPHYLOCOCCUS AUREUS     Note: SUSCEPTIBILITIES PERFORMED ON PREVIOUS CULTURE WITHIN THE LAST 5 DAYS.     Note: Gram Stain Report Called to,Read Back By and Verified With: Otelia Santee RN ON 9.25.15 AT 1915 BY HENDJ     Performed at [facility]   Report Status 10/08/2013 FINAL   Final  CULTURE, BLOOD (ROUTINE X 2)     Status: None   Collection Time    09/15/2013  1:20 PM      Result Value Ref Range Status   Specimen Description BLOOD PICC LINE   Final   Special Requests BOTTLES DRAWN AEROBIC AND ANAEROBIC 6CC RIGHT IJ   Final   Culture  Setup Time     Final   Value: 09/21/2013 23:53     Performed at [facility]   Culture     Final   Value: STAPHYLOCOCCUS AUREUS     Note: RIFAMPIN AND GENTAMICIN SHOULD NOT BE USED AS SINGLE DRUGS FOR TREATMENT OF STAPH INFECTIONS.     Note: Gram Stain Report Called to,Read Back By and Verified With: JENNIFER CLIFTON 10/06/13 3:35AM [place]     Performed at [facility]   Report Status 10/08/2013 FINAL   Final   Organism ID, Bacteria STAPHYLOCOCCUS AUREUS   Final  CULTURE, RESPIRATORY (NON-EXPECTORATED)     Status: None   Collection Time    09/12/2013  3:25 PM      Result Value Ref Range Status   Specimen Description ENDOTRACHEAL   Final   Special Requests NONE   Final   Gram Stain     Final   Value: ABUNDANT WBC PRESENT,BOTH PMN AND MONONUCLEAR     NO SQUAMOUS EPITHELIAL CELLS SEEN     ABUNDANT GRAM POSITIVE COCCI     IN PAIRS IN CLUSTERS     Performed at [facility]  Final   Value: ABUNDANT STAPHYLOCOCCUS AUREUS     Note: RIFAMPIN AND GENTAMICIN SHOULD NOT BE USED AS SINGLE DRUGS FOR TREATMENT OF STAPH INFECTIONS.     MODERATE PSEUDOMONAS AERUGINOSA     ABUNDANT STREPTOCOCCUS,BETA HEMOLYTIC NOT GROUP A     Performed at [facility]   Report Status 10/09/2013 FINAL   Final   Organism ID, Bacteria STAPHYLOCOCCUS AUREUS   Final   Organism ID, Bacteria PSEUDOMONAS AERUGINOSA   Final  MRSA PCR SCREENING     Status: Abnormal   Collection Time    09/25/2013  5:05 PM      Result Value Ref Range Status   MRSA by PCR POSITIVE (*) NEGATIVE Final   Comment:            The GeneXpert MRSA Assay (FDA     approved for NASAL specimens     only), is one component of a     comprehensive MRSA colonization     surveillance program. It is not     intended to diagnose MRSA     infection nor to guide or     monitor treatment for     MRSA infections.     RESULT CALLED TO, READ BACK BY AND VERIFIED WITH:     C.MCKEOWN,RN AT [facility] 09/15/2013  URINE CULTURE     Status: None   Collection Time    09/24/2013  7:42 PM      Result Value Ref Range Status   Specimen Description URINE, CATHETERIZED   Final   Special Requests NONE   Final   Culture  Setup Time     Final   Value: 09/29/2013 20:15     Performed at [facility]     Final   Value: NO GROWTH     Performed at [facility]   Culture     Final   Value: NO GROWTH     Performed at [facility]   Report Status 10/06/2013 FINAL   Final  CULTURE, RESPIRATORY (NON-EXPECTORATED)     Status: None   Collection Time    10/05/13  4:54 PM      Result Value Ref Range Status   Specimen Description TRACHEAL ASPIRATE   Final   Special Requests NONE   Final   Gram Stain     Final   Value: ABUNDANT WBC PRESENT,BOTH PMN AND MONONUCLEAR     RARE SQUAMOUS EPITHELIAL CELLS PRESENT     FEW GRAM POSITIVE COCCI     IN PAIRS IN CLUSTERS     Performed at  [facility]   Culture     Final   Value: FEW STAPHYLOCOCCUS AUREUS     Note: RIFAMPIN AND GENTAMICIN SHOULD NOT BE USED AS SINGLE DRUGS FOR TREATMENT OF STAPH INFECTIONS.     Performed at [facility]   Report Status 10/08/2013 FINAL   Final   Organism ID, Bacteria STAPHYLOCOCCUS AUREUS   Final    Studies/Results: Dg Chest Port 1 View  10/09/2013   CLINICAL DATA:  Central line adjustment.  EXAM: PORTABLE CHEST - 1 VIEW  COMPARISON:  10/09/2013  FINDINGS: Since the previous study, the left central venous catheter has been repositioned. The tip is now directed laterally and is projected over the subclavian vein. Endotracheal tube tip measures 5.1 cm above the carina. Enteric tube tip is not visualized past the EG junction due to soft tissue attenuation. Heart size  and pulmonary vascularity appear normal. There is infiltration or atelectasis in the left lung base. No pneumothorax.  IMPRESSION: Repositioning of left central venous catheter with tip now directed laterally, overlying the left subclavian vein.   Electronically Signed   By: Lucienne Capers M.D.   On: 10/09/2013 21:57   Dg Chest Port 1 View  10/09/2013   CLINICAL DATA:  Central line placement.  EXAM: PORTABLE CHEST - 1 VIEW  COMPARISON:  10/09/2013 at 0557 hr.  FINDINGS: Support apparatus: Endotracheal tube and enteric tube are unchanged. New LEFT IJ central line is present which is malpositioned with the tip extending into the LEFT axilla. Monitoring leads project over the chest.  Cardiomediastinal Silhouette:  Unchanged.  Lungs: Unchanged basilar opacity.  No pneumothorax.  Effusions:  Probable small LEFT  Other:  Patient rotated to the RIGHT.  IMPRESSION: 1. Placement of malpositioned LEFT IJ central line with the tip in the LEFT axilla. These results were called by telephone at the time of interpretation on 10/09/2013 at 9:19 pm to Dr. Lamonte Sakai , who verbally acknowledged these results. 2. Other support apparatus stable. 3.  Unchanged basilar opacity and small LEFT pleural effusion.   Electronically Signed   By: Dereck Ligas M.D.   On: 10/09/2013 21:20   Dg Chest Port 1 View  10/09/2013   CLINICAL DATA:  Check ETT.  EXAM: PORTABLE CHEST - 1 VIEW  COMPARISON:  10/07/2013  FINDINGS: Endotracheal tube and NG tube are unchanged in position. Left lower lobe airspace opacities again noted, slightly increased. Suspect small layering left effusion. Further improvement in the right upper lobe density since prior study.  IMPRESSION: Slight increase left lower lobe atelectasis or pneumonia with small left effusion.  Further improvement in right upper lobe opacity.   Electronically Signed   By: Rolm Baptise M.D.   On: 10/09/2013 07:27   Dg Abd Portable 1v  10/09/2013   CLINICAL DATA:  OG tube placement.  EXAM: PORTABLE ABDOMEN - 1 VIEW  COMPARISON:  None.  FINDINGS: Enteric tube is coiled once over the gastric fundus and has tip in the right upper quadrant likely within the proximal duodenum. Bowel gas pattern is nonobstructive. There is a catheter along the course of the right iliac vessels with tip in the region of the common iliac vessels as this may be a femoral venous catheter. Remainder of the exam is unremarkable.  IMPRESSION: Nonobstructive bowel gas pattern.  Enteric tube as described with tip over the right upper quadrant likely over the proximal duodenum.   Electronically Signed   By: Marin Olp M.D.   On: 10/09/2013 23:05     Assessment/Plan: MSSA bacteremia  MSSA pneumonia, LLL Hepatitis (sepsis related?)  Ancef day 7  Hepatitis studies (-) Repeat BCx sent today Given his respiratory status and DNI, would not check TEE on him Would aim for 3 weeks of ancef with repeat BCx 1 week after he completes therapy.  Available if questions         Bobby Rumpf Infectious Diseases (pager) [phone] [email or web address] 10/10/2013, 10:13 AM  LOS: 6 days

## 2013-10-10 NOTE — Progress Notes (Signed)
Inpatient Diabetes Program Recommendations  AACE/ADA: New Consensus Statement on Inpatient Glycemic Control (2013)  Target Ranges:  Prepandial:   less than 140 mg/dL      Peak postprandial:   less than 180 mg/dL (1-2 hours)      Critically ill patients:  140 - 180 mg/dL   Results for William Stark, William Stark (MRN [id number]) as of 10/10/2013 10:48  Ref. Range 10/09/2013 00:29 10/09/2013 03:44 10/09/2013 08:35 10/09/2013 12:00 10/09/2013 15:23 10/09/2013 21:00 10/09/2013 23:49 10/10/2013 03:44 10/10/2013 08:29  Glucose-Capillary Latest Range: 70-99 mg/dL 149 (H) 179 (H) 202 (H) 223 (H) 213 (H) 222 (H) 195 (H) 159 (H) 185 (H)   Diabetes history: DM2  Outpatient Diabetes medications: Novolog 2-15 units TID with meals  Current orders for Inpatient glycemic control: Novolog 0-15 units Q4H   Inpatient Diabetes Program Recommendations  Insulin - Basal: Please consider ordering Levemir 10 units daily (based on 100 kg x 0.1 units).   Note: CBGs ranged from 149-223 mg/dl on 10/10/13 and patient received a total of Novolog 17 units for correction on 10/10/13. If appropriate for patient, please consider ordering low dose basal insulin (Levemir 10 units daily). Noted planned extubation today per family request with DNI.

## 2013-10-10 NOTE — Progress Notes (Signed)
Pt previously on Fentanyl drip 10 mcg/ml; 50 ml of 250 ml bag of Fentanyl wasted in sink; witnessed by Marisue Brooklyn, RN.  Lenor Coffin, RN

## 2013-10-10 NOTE — Progress Notes (Signed)
Phlebotomy unable to obtain blood cx from peripheral stick x multiple attempts; per Dr Lamonte Sakai, okay to draw blood cx from new left IJ CVC X 2 sets 30 min apart; 1st set of blood cultures drawn from distal port of left IJ CVC and sent to lab... Blair Hailey, RN

## 2013-10-10 NOTE — Progress Notes (Signed)
[facility] Progress Note Patient Name: William Stark DOB: May 08, 1945 MRN: [id number]   Date of Service  10/10/2013  HPI/Events of Note  Pt with some increased WOB and discomfort since extubation. Slightly tachycardic but otherwise stable.   eICU Interventions  Will order intermittent doses of morphine and follow.      Intervention Category Intermediate Interventions: Pain - evaluation and management  BYRUM,ROBERT S. 10/10/2013, 5:25 PM

## 2013-10-12 LAB — HEPARIN INDUCED THROMBOCYTOPENIA PNL
HEPARIN INDUCED PLT AB: NEGATIVE
PATIENT O. D.: 0.161
UFH High Dose UFH H: 0 % Release
UFH LOW DOSE 0.1 IU/ML: 0 %
UFH Low Dose 0.5 IU/mL: 0 % Release
UFH SRA RESULT: NEGATIVE

## 2013-10-12 NOTE — Progress Notes (Signed)
Chaplain responded to a request by the [facility] at the death of a patient. Chaplain provided pastoral presence and prayer of comfort to family members.  Other comfort measures were extended to the family. Other family members arrived and spent time sharing family memories. Chaplain was called to another trauma and informed staff of the same.   [place] [phone]

## 2013-10-12 NOTE — Progress Notes (Signed)
PULMONARY / CRITICAL CARE MEDICINE   Name: William Stark MRN: [id number] DOB: 04-29-1945    ADMISSION DATE:  10/05/2013 CONSULTATION DATE:  10-19-13  REFERRING MD :  EDP  CHIEF COMPLAINT:  Fever  INITIAL PRESENTATION: 68 y.o. M, nursing home resident [facility]) brought to [facility] ED on 9/23 with fever x 1 day to 101.5.  In ED, developed respiratory failure, failed BiPAP and required intubation.  PCCM was consulted for admission.   SIGNIFICANT EVENTS: 9/23 - intubated while in ED (by CCM), admitted to ICU. RUL PNA on CXR 9/23 - family wants limited code, no HD 9/24 - LVEF 55-60%, wall motion abnormalities 9/24 - RUQ U/S  > normal CBD, no gallbladder 9/25 - Staph in sputum, GPC in blood, minimal UOP, hyper k,  9/26 -  Patient now full no code blue without desire for escalation - per RN ok for pressors but NO CPR and no HD. GPC in blood +. On levophed and vaso 9/28 - Failed SBT 9/29 - planned extubation for 9/29 4 pm with DNI / DNR status per family.  SUBJECTIVE/OVERNIGHT/INTERVAL HX:  RN reports pt more comfortable on morphine gtt  VITAL SIGNS: Temp:  [97.4 F (36.3 C)-98 F (36.7 C)] 97.4 F (36.3 C) (09/29 1521) Pulse Rate:  [81-108] 88 (09/30 0900) Resp:  [9-24] 13 (09/30 0900) BP: (77-96)/(49-67) 80/49 mmHg (09/29 1800) SpO2:  [86 %-100 %] 91 % (09/30 0900) FiO2 (%):  [30 %] 30 % (09/29 1200)  VENTILATOR SETTINGS: Vent Mode:  [-] PRVC FiO2 (%):  [30 %] 30 % Set Rate:  [12 bmp] 12 bmp Vt Set:  [630 mL] 630 mL PEEP:  [5 cmH20] 5 cmH20 Plateau Pressure:  [10 cmH20] 10 cmH20  INTAKE / OUTPUT: Intake/Output     09/29 0701 - 09/30 0700 09/30 0701 - 10/01 0700   I.V. (mL/kg) 983 (9.8) 70 (0.7)   NG/GT 340    IV Piggyback 160    Total Intake(mL/kg) 1483 (14.8) 70 (0.7)   Urine (mL/kg/hr) 1070 (0.4) 15 (0)   Total Output 1070 15   Net +413 +55          PHYSICAL EXAMINATION: Gen: adult male in NAD, appears comfortable HEENT: NCAT PULM: slow respirations,  lungs bilaterally with scattered rhonchi CV:  Regular, no m/r/g AB: BS+, distended, non-tender Ext: warm/dry  Neuro: no response to verbal stimuli, morphine gtt infusing for comfort  LABS: CBC  Recent Labs Lab 10/08/13 0520 10/09/13 0445 10/10/13 0535  HGB 7.7* 8.0* 7.3*  HCT 21.7* 22.6* 21.3*  WBC 29.7* 27.4* 19.7*  PLT 139* 113* 89*   CHEMISTRY  Recent Labs Lab 09/13/2013 1900  10/06/13 0500 10/06/13 1332 10/07/13 0600 10/09/13 0445 10/10/13 0535  NA 141  < > 140 137 138 141 144  K 4.7  < > 5.5* 5.0 5.5* 4.2 4.0  CL 113*  < > 110 109 103 102 105  CO2 15*  < > 15* 14* 17* 24 25  GLUCOSE 141*  < > 117* 155* 194* 226* 169*  BUN 38*  < > 59* 58* 71* 79* 85*  CREATININE 2.73*  < > 4.19* 3.96* 4.69* 4.79* 4.74*  CALCIUM 6.9*  < > 6.2* 5.8* 5.9* 5.9* 6.2*  MG 1.8  --   --   --   --  2.1 2.2  PHOS 6.7*  --   --   --   --  8.0* 7.9*  < > = values in this interval not displayed. Estimated Creatinine  Clearance: 18.3 ml/min (by C-G formula based on Cr of 4.74).    IMAGING x48h Dg Chest Port 1 View  10/09/2013   CLINICAL DATA:  Central line adjustment.  EXAM: PORTABLE CHEST - 1 VIEW  COMPARISON:  10/09/2013  FINDINGS: Since the previous study, the left central venous catheter has been repositioned. The tip is now directed laterally and is projected over the subclavian vein. Endotracheal tube tip measures 5.1 cm above the carina. Enteric tube tip is not visualized past the EG junction due to soft tissue attenuation. Heart size and pulmonary vascularity appear normal. There is infiltration or atelectasis in the left lung base. No pneumothorax.  IMPRESSION: Repositioning of left central venous catheter with tip now directed laterally, overlying the left subclavian vein.   Electronically Signed   By: Lucienne Capers M.D.   On: 10/09/2013 21:57   Dg Chest Port 1 View  10/09/2013   CLINICAL DATA:  Central line placement.  EXAM: PORTABLE CHEST - 1 VIEW  COMPARISON:  10/09/2013 at 0557  hr.  FINDINGS: Support apparatus: Endotracheal tube and enteric tube are unchanged. New LEFT IJ central line is present which is malpositioned with the tip extending into the LEFT axilla. Monitoring leads project over the chest.  Cardiomediastinal Silhouette:  Unchanged.  Lungs: Unchanged basilar opacity.  No pneumothorax.  Effusions:  Probable small LEFT  Other:  Patient rotated to the RIGHT.  IMPRESSION: 1. Placement of malpositioned LEFT IJ central line with the tip in the LEFT axilla. These results were called by telephone at the time of interpretation on 10/09/2013 at 9:19 pm to Dr. Lamonte Sakai , who verbally acknowledged these results. 2. Other support apparatus stable. 3. Unchanged basilar opacity and small LEFT pleural effusion.   Electronically Signed   By: Dereck Ligas M.D.   On: 10/09/2013 21:20   Dg Abd Portable 1v  10/09/2013   CLINICAL DATA:  OG tube placement.  EXAM: PORTABLE ABDOMEN - 1 VIEW  COMPARISON:  None.  FINDINGS: Enteric tube is coiled once over the gastric fundus and has tip in the right upper quadrant likely within the proximal duodenum. Bowel gas pattern is nonobstructive. There is a catheter along the course of the right iliac vessels with tip in the region of the common iliac vessels as this may be a femoral venous catheter. Remainder of the exam is unremarkable.  IMPRESSION: Nonobstructive bowel gas pattern.  Enteric tube as described with tip over the right upper quadrant likely over the proximal duodenum.   Electronically Signed   By: Marin Olp M.D.   On: 10/09/2013 23:05      ASSESSMENT / PLAN:  PULMONARY OETT 9/23 >>>9/29 A: Acute hypoxemic respiratory failure MSSA  / Pseudomonal HCAP P:   Extubated to comfort care 9/29 Morphine gtt for pain / SOB, titrate for normal respiratory rate (12-20) Appears without pain or suffering  CARDIOVASCULAR CVL R IJ 9/23 placed by ED >>>9/23 (pulled out by patient) CVL R Fem 9/23>> dc  9/28  A Line R Fem 9/23 >>>9/25 (pulled  out by patient) 9/28 lt i j cvl>>  A:  Septic Shock - currently off levophed DVT  left posterior tibial vein - per LE dopplers 9/10. Hx of HT P:  D/C acute interventions Tx to Palliative floor  RENAL A:   ATN  - with severe acidosis, patient/family does not want hemodialysis: officially ATN on 10/07/13. Family rejected HD P:   D/C further lab draws, comfort focused care   GASTROINTESTINAL A:  Nutrition GI prophylaxis Elevated alk phos/transaminasis, history of cholestectomy> normal RUQ U/S 9/24 P:   D/C nutrition, comfort feeds if patient requests  HEMATOLOGIC A:   VTE Prophylaxis DVT left posterior tibial vein - per LE dopplers 9/10. Coagulopathy -  9/26 and heparing gtt on hold Drop in plat , r/o HITT P:  Comfort measures, no further lab draws   INFECTIOUS MRSA PCR 9/25 >> positive Blood culture 9/23 >> Staph aureus >> MSSA Urine 9/23 >> neg REsp 9/23 >> abundant staph aureus >> MSSA Resp 9/24 >> mssa, pseudomonas >> sens ceftaz BCx2 9/29 >>  A:   Septic Shock - severe, secondary to MSSA pneumonia P:   D/C abx Comfort measures  ENDOCRINE A:   DM P:   D/C SSI  NEUROLOGIC A:   Acute metabolic encephalopathy P:   Morphine gtt for transition / comfort care  TODAY'S SUMMARY: MODS in the setting of failure to thrive.  Family requested comfort care 9/29 with withdrawal of mechanical ventilation.  Transition out to palliative floor.  Death likely hours to day.      Noe Gens, NP-C [facility] Pgr: [phone] or [phone]   10/27/2013, 10:47 AM  I have fully examined this patient and agree with above findings.      Lavon Paganini. Titus Mould, MD, [place] Pgr: [facility]

## 2013-10-12 NOTE — Care Management Note (Addendum)
    Page 1 of 1   2013/11/07     11:10:15 AM CARE MANAGEMENT NOTE 2013/11/07  Patient:  KENTRELL, HALLAHAN   Account Number:  [id number]  Date Initiated:  10/09/2013  Documentation initiated by:  [facility]  Subjective/Objective Assessment:   resp failure     Action/Plan:   from [facility]   Anticipated DC Date:     Anticipated DC Plan:  [place] referral  Clinical Social Worker      DC Planning Services  CM consult      Choice offered to / List presented to:             Status of service:   Medicare Important Message given?  YES (If response is "NO", the following Medicare IM given date fields will be blank) Date Medicare IM given:  11/07/2013 Medicare IM given by:  Elissa Hefty Date Additional Medicare IM given:  10/10/2013 Additional Medicare IM given by:  [facility] Lyrah Bradt  Discharge Disposition:    Per UR Regulation:    If discussed at Long Length of Stay Meetings, dates discussed:   10/12/2013    Comments:

## 2013-10-12 DEATH — deceased

## 2013-10-16 LAB — CULTURE, BLOOD (ROUTINE X 2)
CULTURE: NO GROWTH
Culture: NO GROWTH

## 2013-10-26 NOTE — Discharge Summary (Signed)
Dc summ- death note  Name: William Stark  MRN: [id number]  DOB: 19-Nov-1945  ADMISSION DATE: 09/12/2013  CONSULTATION DATE: October 27, 2013  REFERRING MD : EDP  CHIEF COMPLAINT: Fever  INITIAL PRESENTATION: 68 y.o. M, nursing home resident [facility]) brought to [facility] ED on 9/23 with fever x 1 day to 101.5. In ED, developed respiratory failure, failed BiPAP and required intubation. PCCM was consulted for admission.  SIGNIFICANT EVENTS:  9/23 - intubated while in ED (by CCM), admitted to ICU. RUL PNA on CXR  9/23 - family wants limited code, no HD  9/24 - LVEF 55-60%, wall motion abnormalities  9/24 - RUQ U/S > normal CBD, no gallbladder  9/25 - Staph in sputum, GPC in blood, minimal UOP, hyper k,  9/26 - Patient now full no code blue without desire for escalation - per RN ok for pressors but NO CPR and no HD. GPC in blood +. On levophed and vaso  9/28 - Failed SBT  9/29 - planned extubation for 9/29 4 pm with DNI / DNR status per family.   Course also c/w:  MODS in settig failure to thrive Family rejected HD, comfort care decided, laci of prgress  FInal Diagnosis upon death  1. Pneumonia, HCAP, MSSA 2. Acute renal failure 3. MODS 4. Septic shock  Lavon Paganini. Titus Mould, MD, [place] Pgr: [facility]

## 2013-11-03 LAB — AFB CULTURE WITH SMEAR (NOT AT ARMC): ACID FAST SMEAR: NONE SEEN

## 2013-11-12 DEATH — deceased

## 2015-01-16 IMAGING — CR DG CHEST 1V
1 series · 1 of 1 positions shown · non-contrast
Comparison: PA and lateral chest of August 03, 2005

CLINICAL DATA: Hypotension

EXAM:
CHEST - 1 VIEW

[x chest ap]
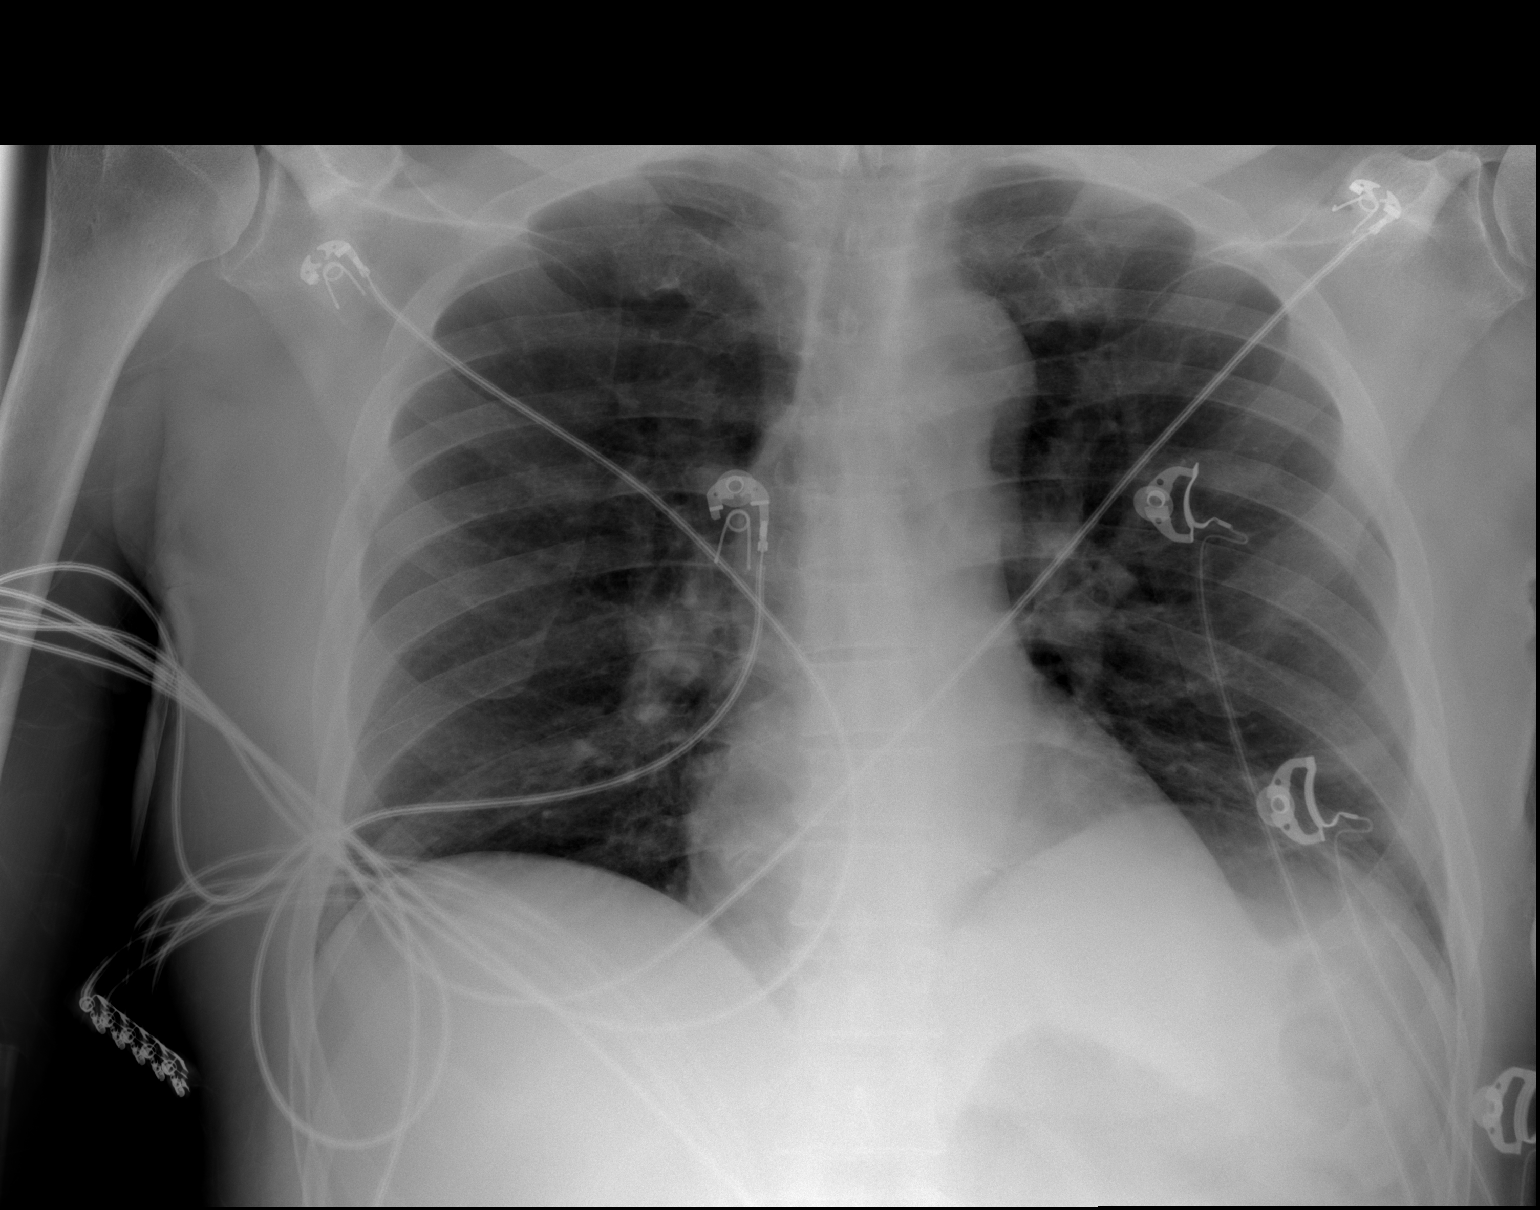

[1 of 1 positions shown; findings below may reference images not displayed]

FINDINGS: The lungs are mildly hypoinflated. There is no focal infiltrate. The
heart and pulmonary vascularity are normal. The mediastinum is
normal in width. There is mild stable tortuosity of the descending
thoracic aorta. There is no pleural effusion. The bony thorax is
unremarkable.
IMPRESSION: There is no acute cardiopulmonary abnormality.

## 2015-01-16 IMAGING — CR DG CHEST 1V PORT
1 series · 1 of 1 positions shown · non-contrast
Comparison: 09/16/2003

CLINICAL DATA: Central line insertion.

EXAM:
PORTABLE CHEST - 1 VIEW

[AP]
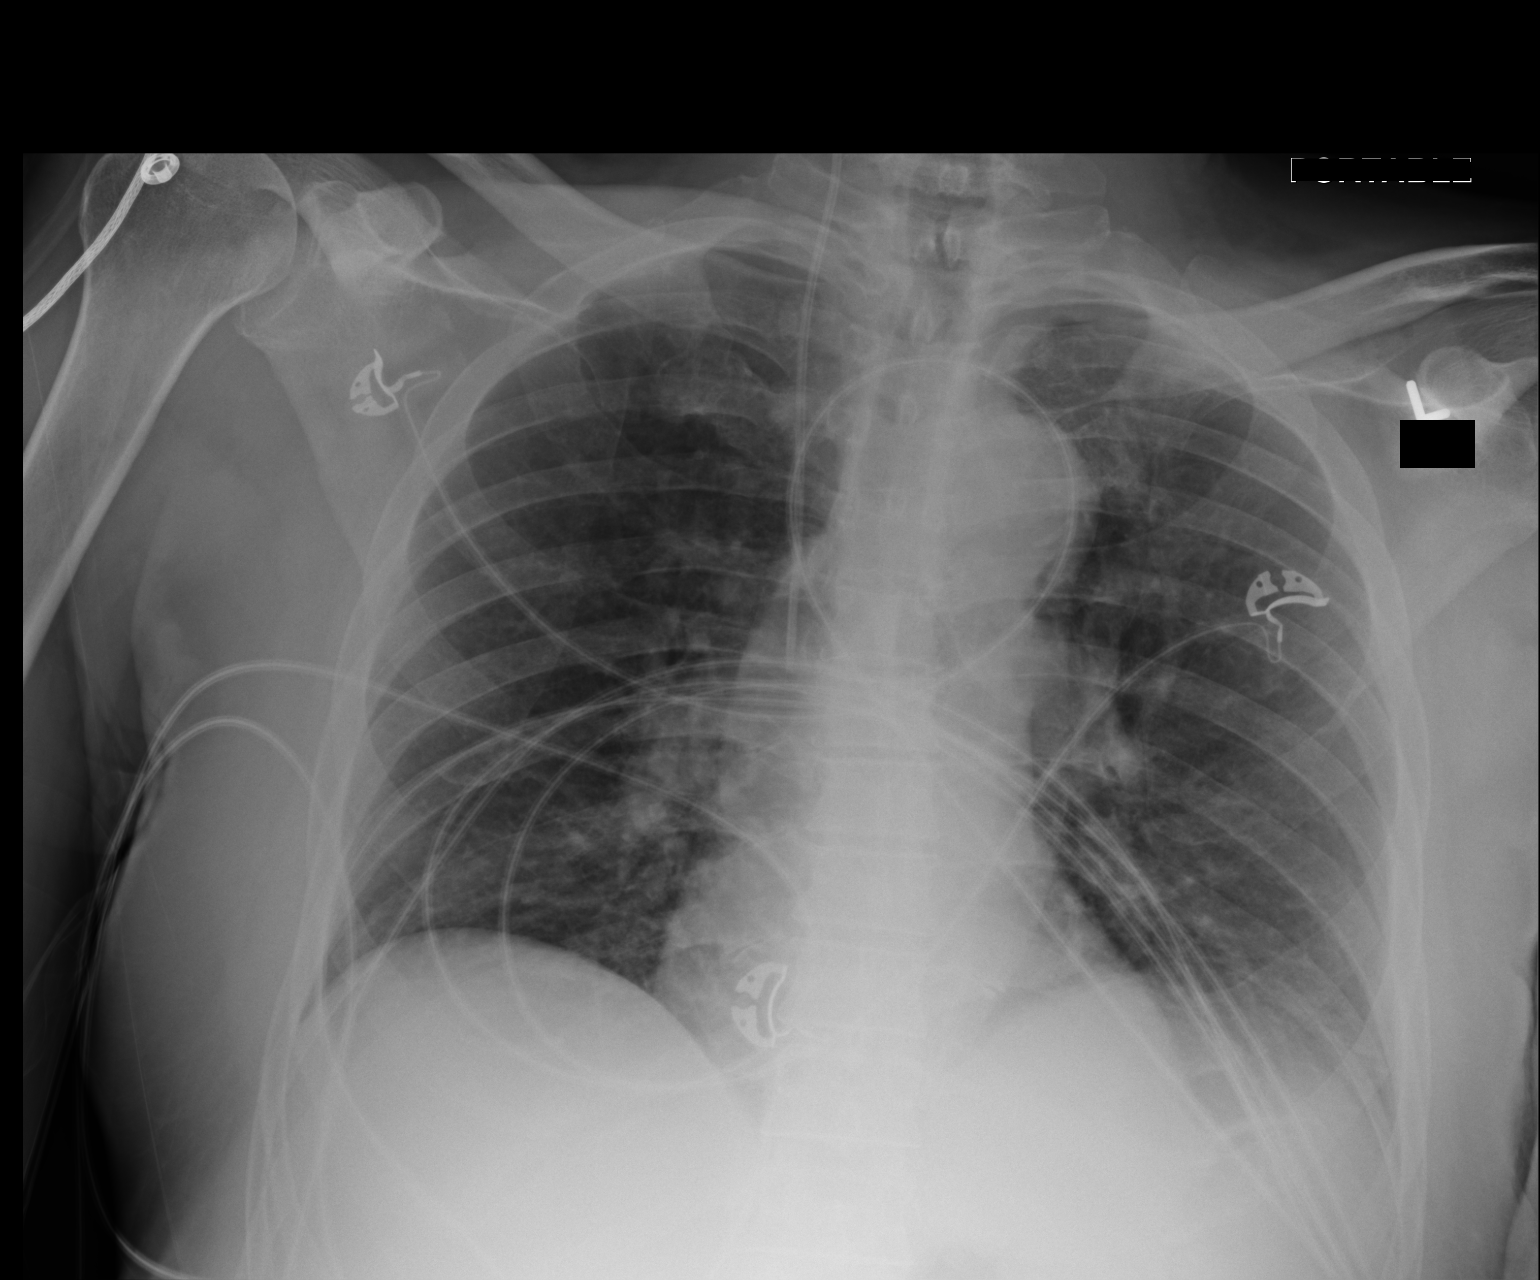

[1 of 1 positions shown; findings below may reference images not displayed]

FINDINGS: Interval placement of right central venous catheter with tip over
the mid SVC region. No pneumothorax. Shallow inspiration. Hazy
opacity in the left lung base suggesting infiltration or
atelectasis. Right lung is grossly clear. Normal heart size and
pulmonary vascularity.
IMPRESSION: Right central venous catheter tip over the mid SVC region. No
pneumothorax. Hazy infiltration or atelectasis in the left lung
base.

## 2015-01-16 IMAGING — US US RENAL
1 series · 14 of 25 positions shown · non-contrast
Comparison: CT 06/12/2005

CLINICAL DATA: Acute renal failure

EXAM:
RENAL/URINARY TRACT ULTRASOUND COMPLETE

[Series 1: us renal · 0.22mm/px · 37 acquisitions, 14 frames shown]
[im 1/37]
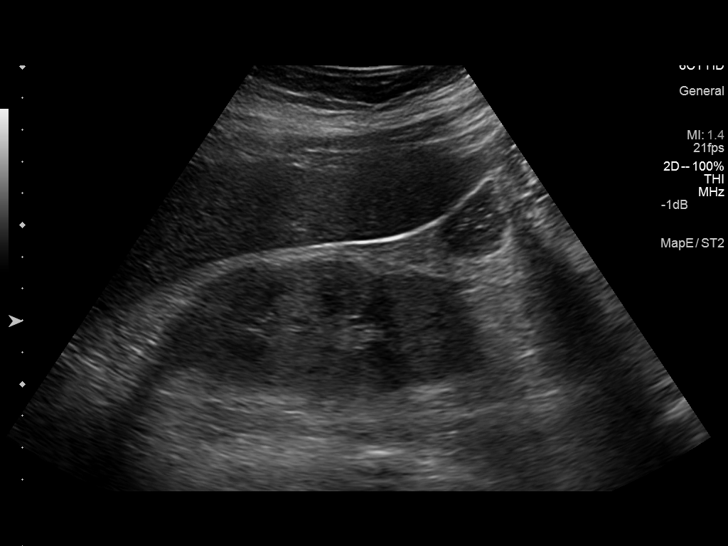
[im 4/37]
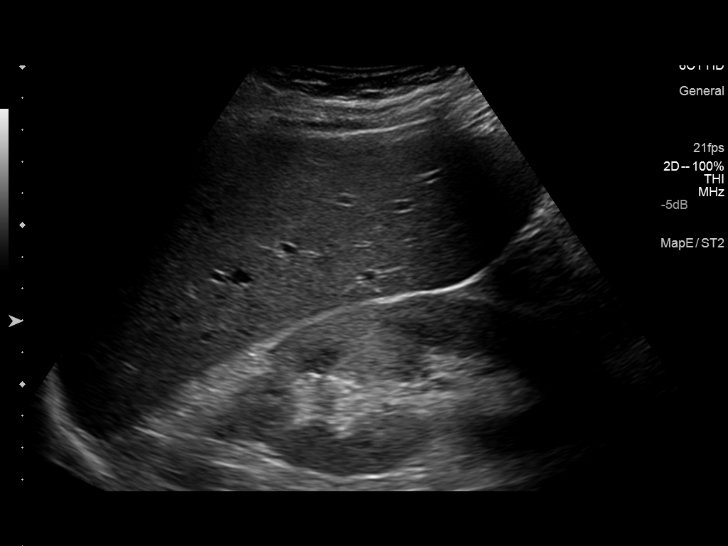
[im 7/37]
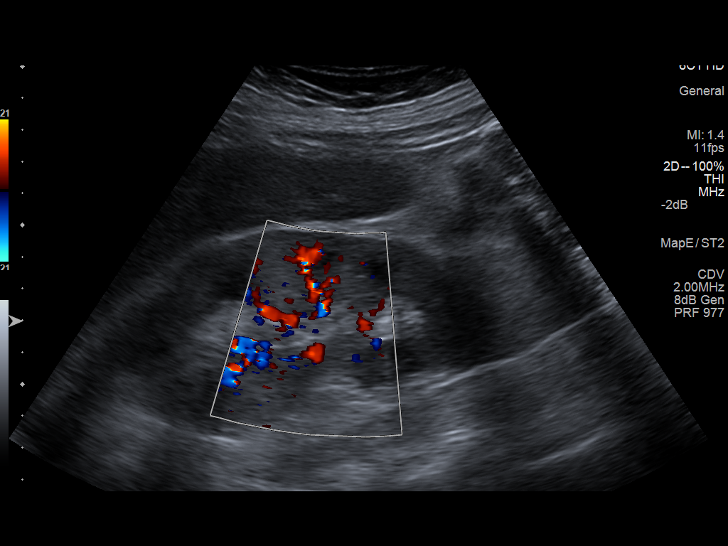
[im 10/37]
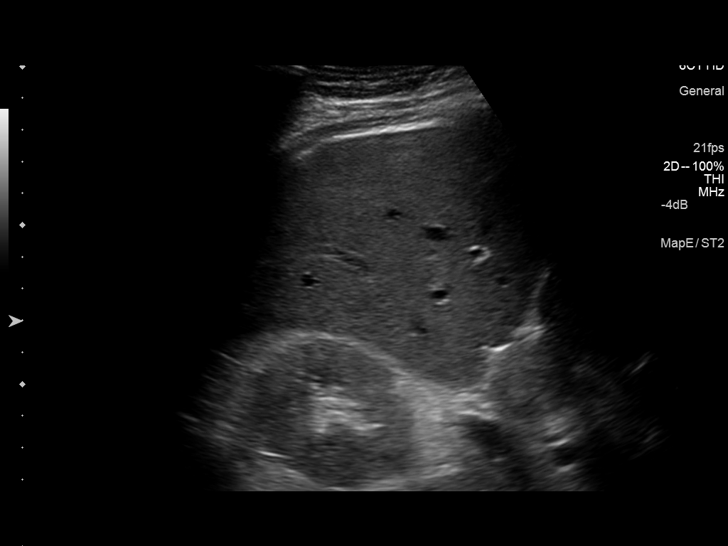
[im 13/37]
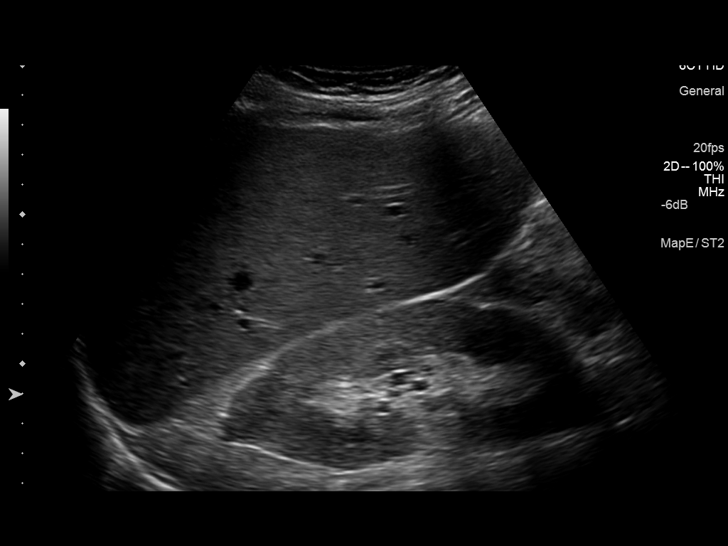
[im 14/37]
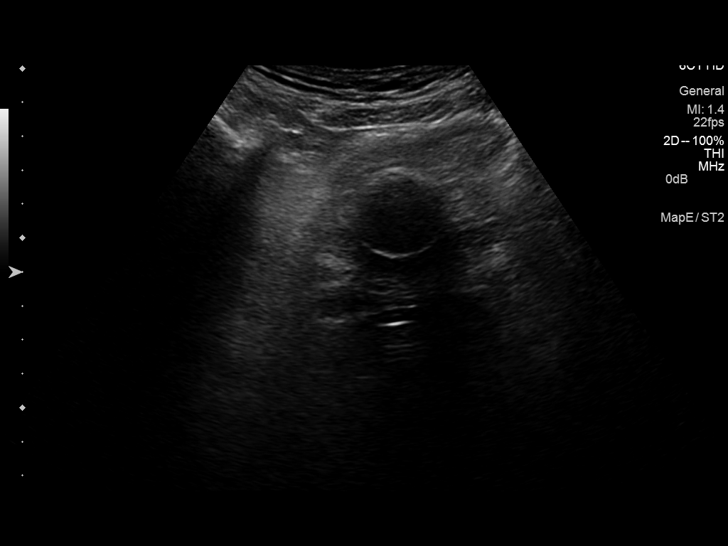
[im 17/37]
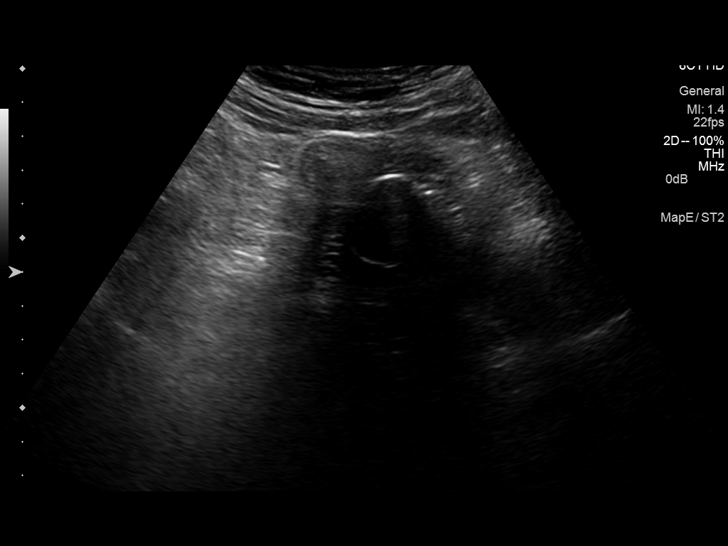
[im 20/37]
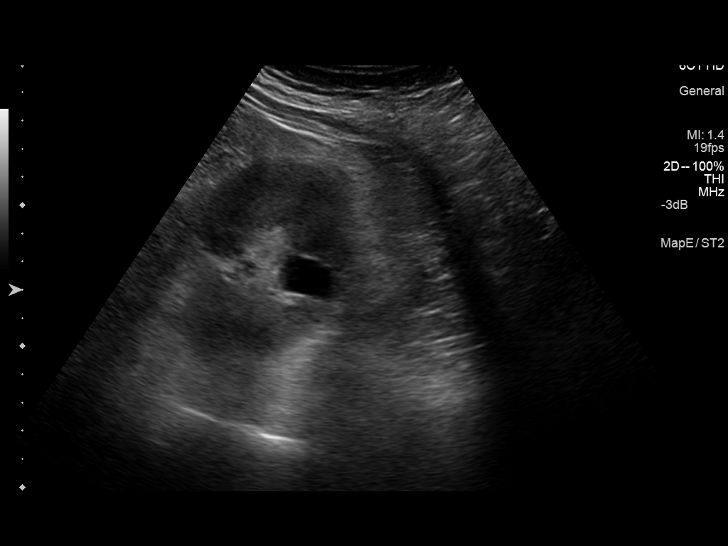
[im 23/37]
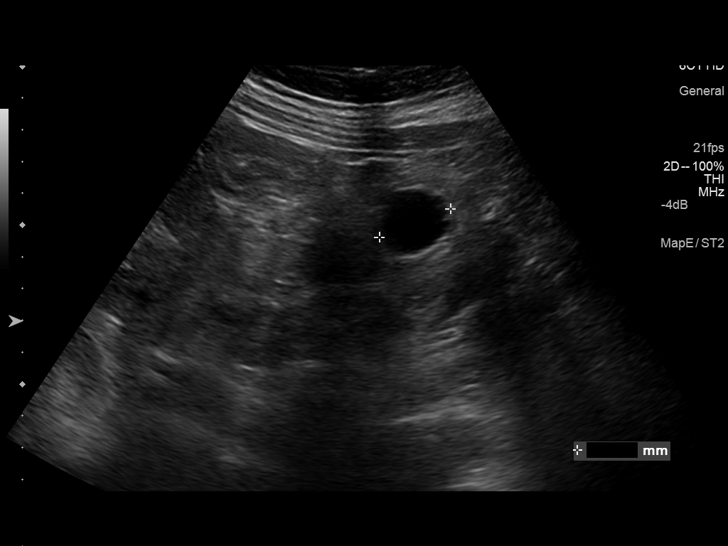
[im 25/37]
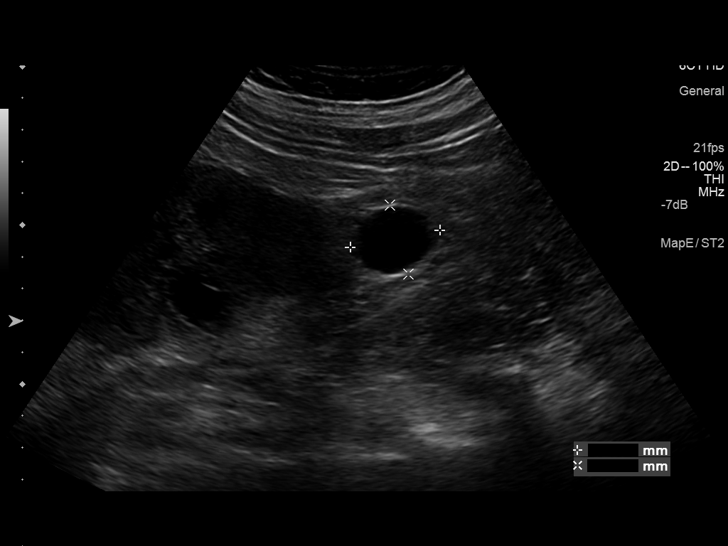
[im 28/37]
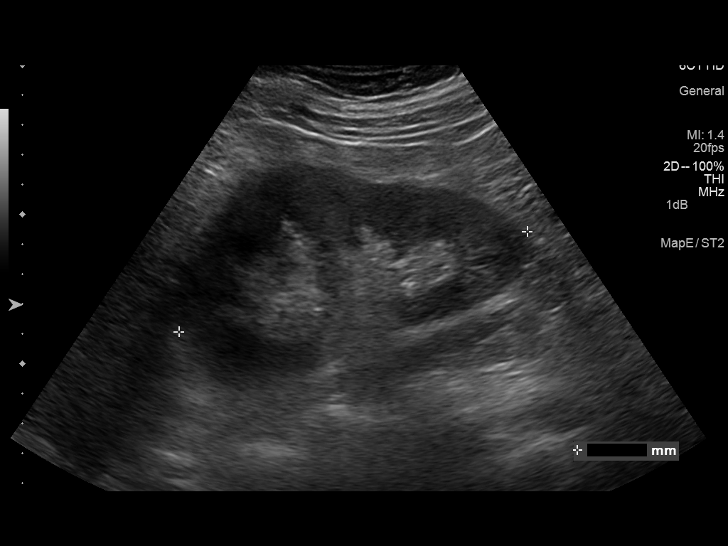
[im 31/37]
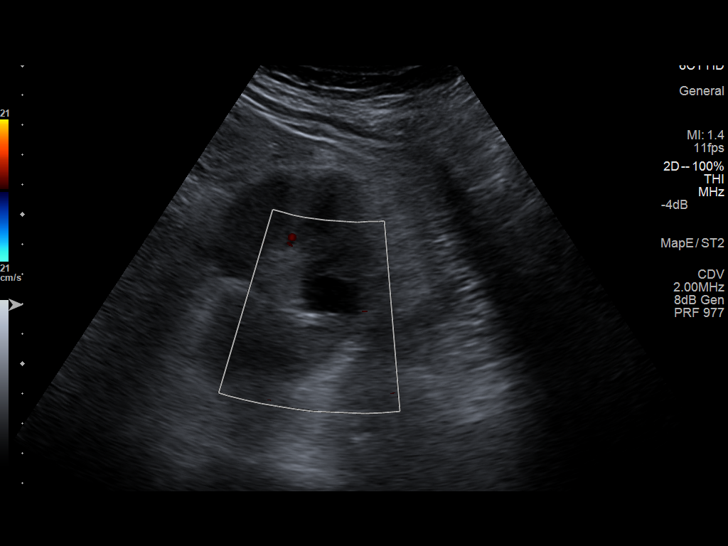
[im 34/37]
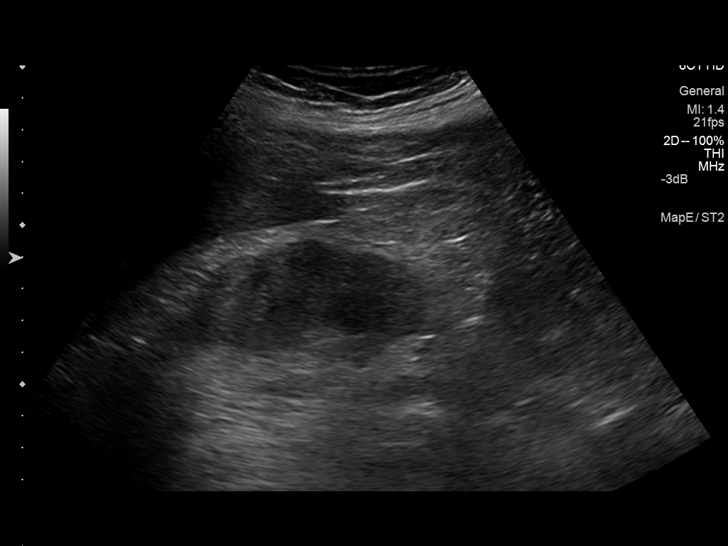
[im 37/37]
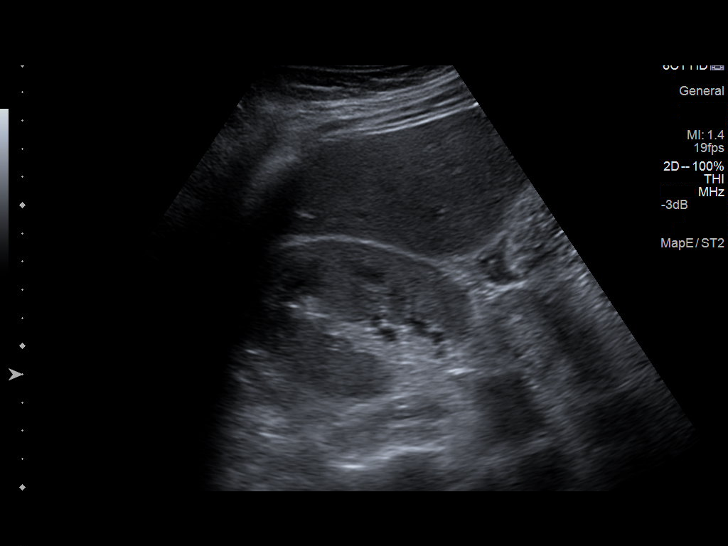

[14 of 25 positions shown; findings below may reference images not displayed]

FINDINGS: Right Kidney:

Length: 12.8 cm. Echogenicity within normal limits. No mass or
hydronephrosis visualized.

Left Kidney:

Length: 12.1 cm. Normal renal cortical thickness and echogenicity.
No hydronephrosis. There is a 2.9 cm cyst within the inferior pole
of the left kidney and a 2.6 cm cyst within the interpolar region
left kidney.

Bladder:

Decompressed with Foley catheter
IMPRESSION: No hydronephrosis.

## 2015-01-25 IMAGING — US US ABDOMEN LIMITED
1 series · 14 of 20 positions shown · non-contrast
Comparison: No priors.

CLINICAL DATA: Elevated liver function tests.

EXAM:
US ABDOMEN LIMITED - RIGHT UPPER QUADRANT

[Series 1: us abdomen limited · 0.28mm/px · 14 of 20 slices shown]
[im 1/20]
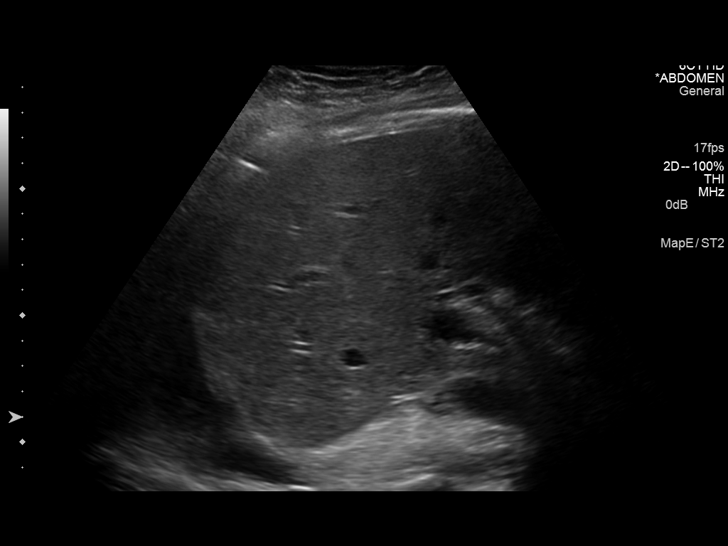
[im 3/20]
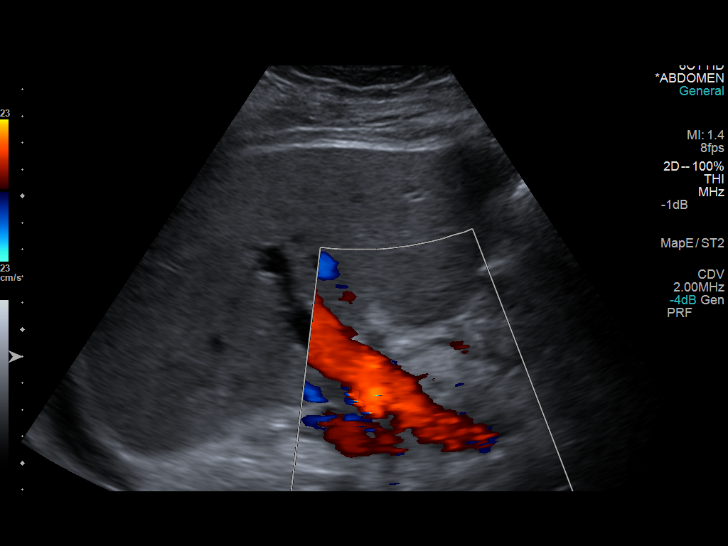
[im 4/20]
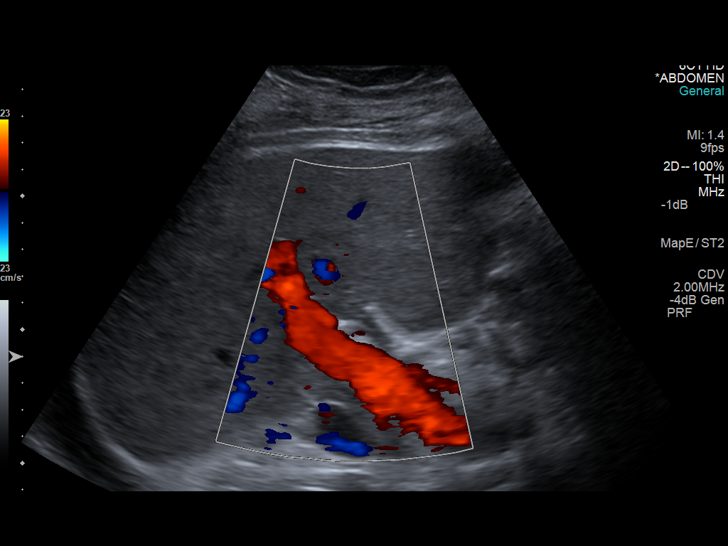
[im 6/20]
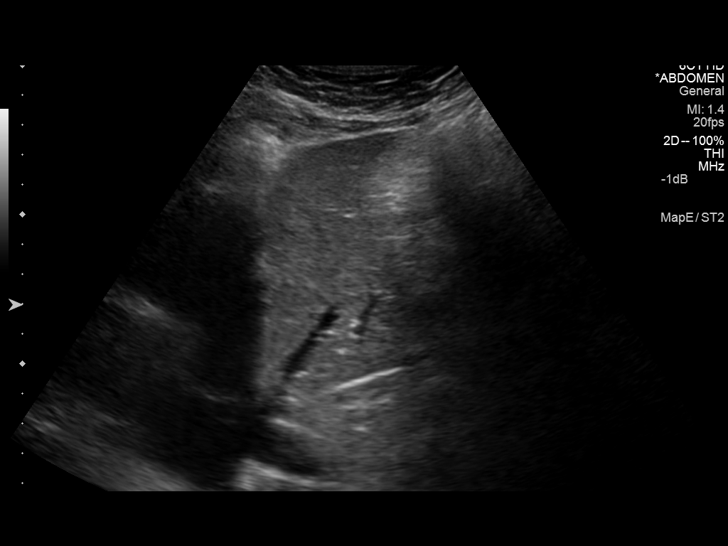
[im 7/20]
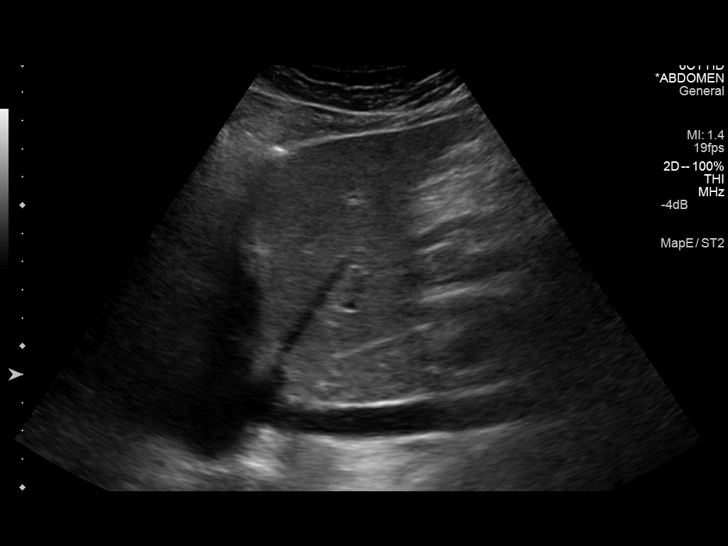
[im 8/20]
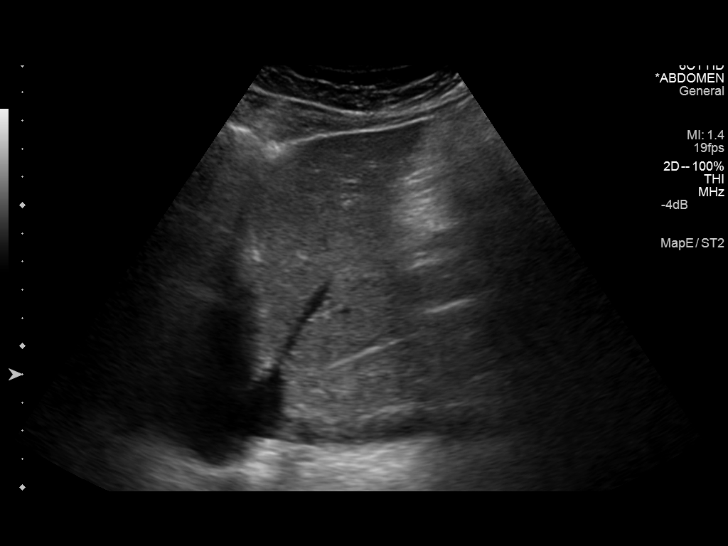
[im 10/20]
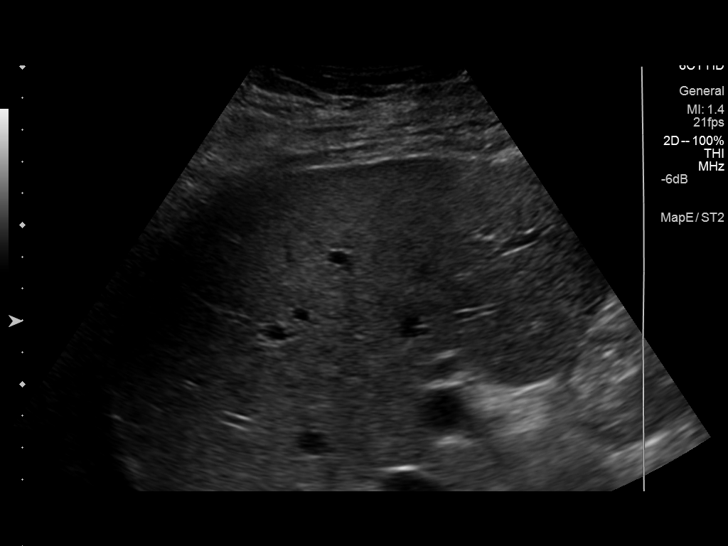
[im 11/20]
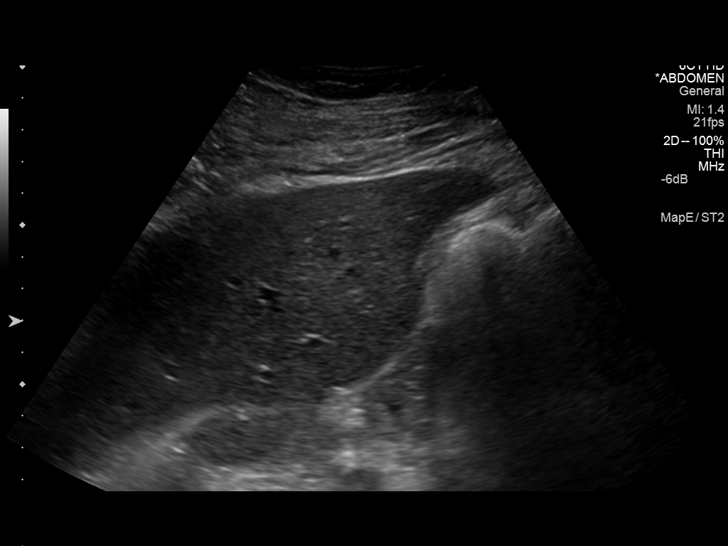
[im 13/20]
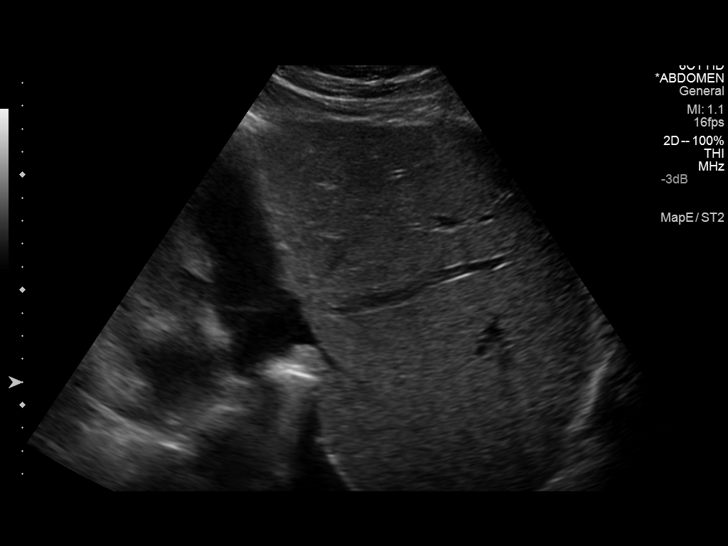
[im 14/20]
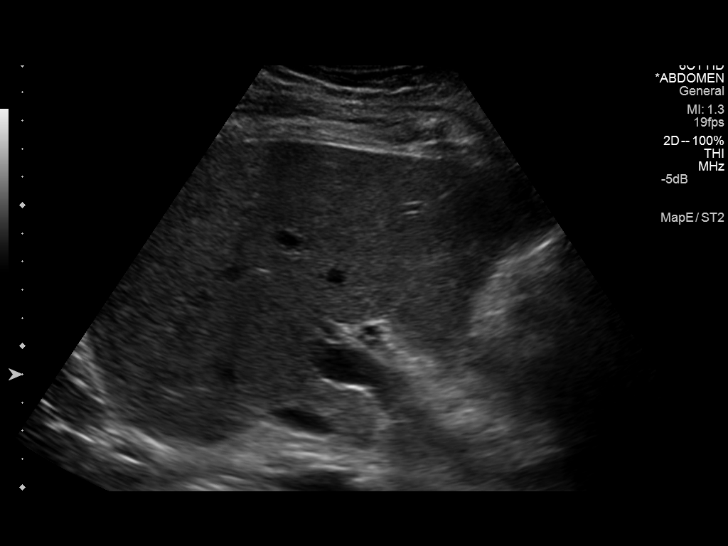
[im 16/20]
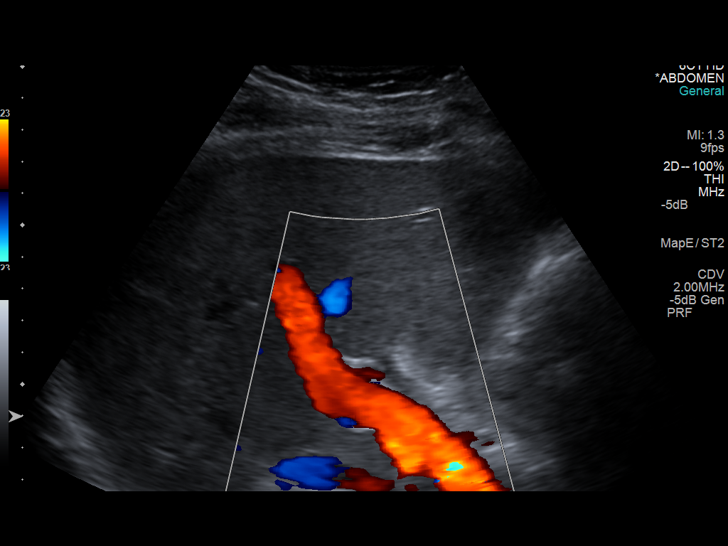
[im 17/20]
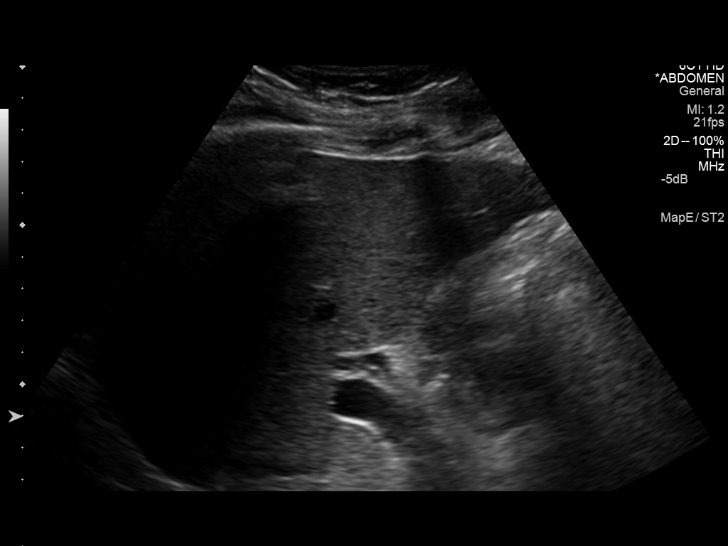
[im 18/20]
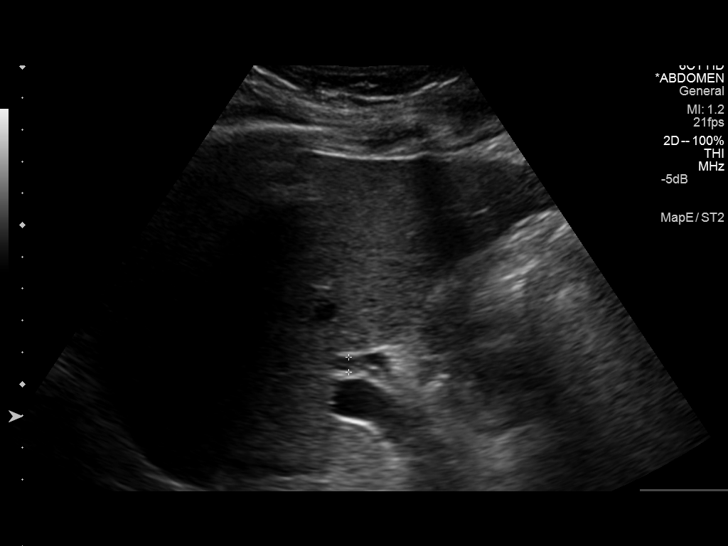
[im 20/20]
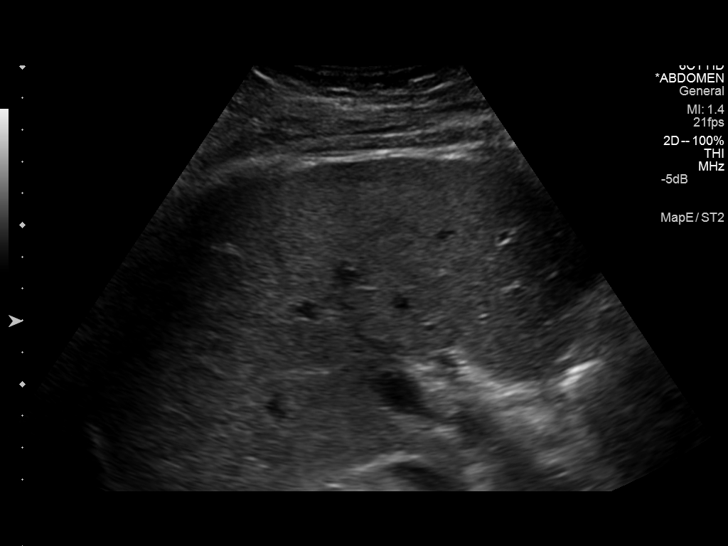

[14 of 20 positions shown; findings below may reference images not displayed]

FINDINGS: Gallbladder:

Status post cholecystectomy.

Common bile duct:

Diameter: 5 mm in the porta hepatis.

Liver:

No focal lesion identified. Within normal limits in parenchymal
echogenicity.

Other Findings:

Small right pleural effusion.
IMPRESSION: 1. No acute findings in the abdomen.
2. Small right pleural effusion.
3. Status post cholecystectomy.

## 2015-02-04 IMAGING — CR DG CHEST 1V PORT
1 series · 1 of 1 positions shown · non-contrast
Comparison: Portable chest x-ray of earlier today at [DATE] hrs.

CLINICAL DATA: Shortness of breath, code sepsis

EXAM:
PORTABLE CHEST - 1 VIEW

[AP]
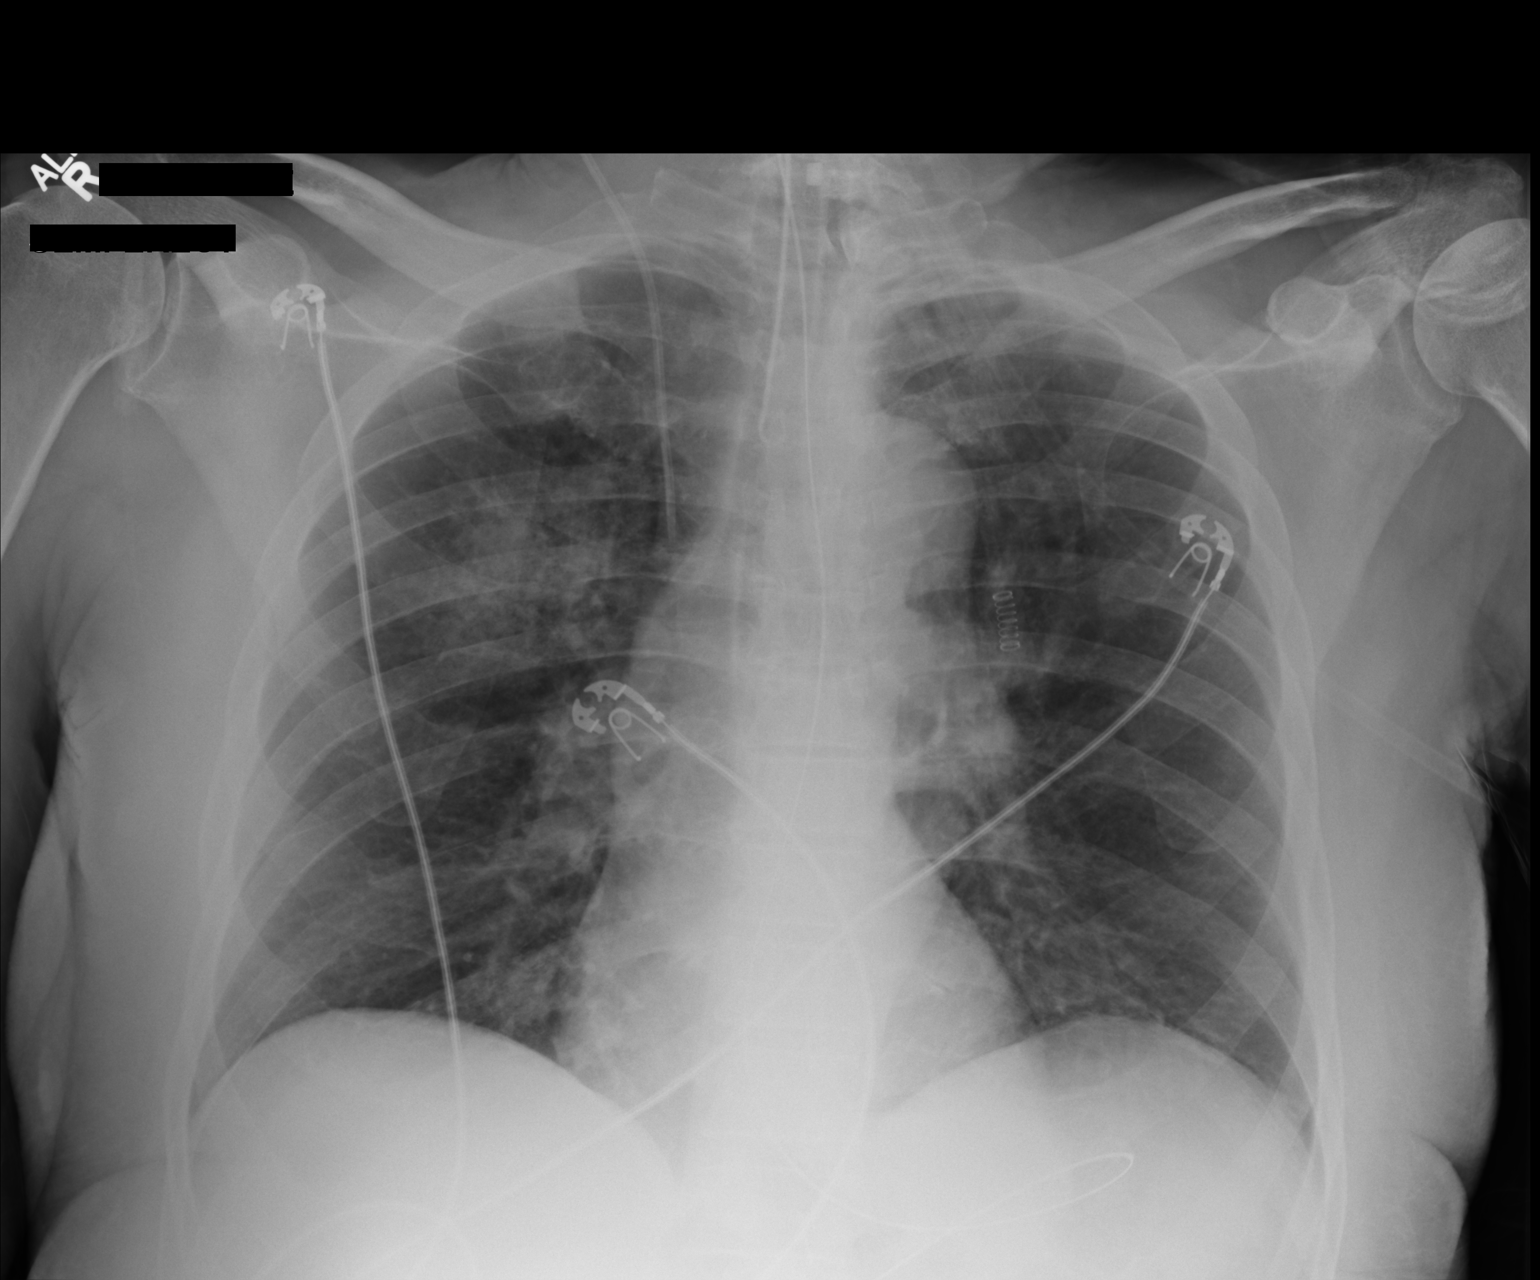

[1 of 1 positions shown; findings below may reference images not displayed]

FINDINGS: The patient has undergone interval intubation of the trachea and
esophagus. The endotracheal tube tip lies 3.4 cm above the crotch of
the carina. The tip and proximal port of the esophagogastric tube
project below the inferior margin of the image. The tip of the
pre-existing right internal jugular venous catheter overlies the
proximal SVC.

The confluent alveolar density in the right upper lobe persists.
Elsewhere the lung parenchyma is clear. The heart and pulmonary
vascularity within the limits of normal.
IMPRESSION: 1. There is persistent alveolar density in the right upper lobe
consistent with pneumonia.
2. Positioning of the endotracheal tube is good approximately 3.4 cm
above the crotch of the carina.
